# Patient Record
Sex: Female | Born: 1990 | Race: Black or African American | Hispanic: No | Marital: Single | Smoking: Current every day smoker
Health system: Southern US, Community
[De-identification: ages and names within clinical notes are randomized; demographics above are authoritative.]

## PROBLEM LIST (undated history)

## (undated) ENCOUNTER — Inpatient Hospital Stay (HOSPITAL_COMMUNITY): Payer: Self-pay

## (undated) DIAGNOSIS — Z8632 Personal history of gestational diabetes: Secondary | ICD-10-CM

## (undated) DIAGNOSIS — F32A Depression, unspecified: Secondary | ICD-10-CM

## (undated) DIAGNOSIS — Z98891 History of uterine scar from previous surgery: Secondary | ICD-10-CM

## (undated) DIAGNOSIS — Z90711 Acquired absence of uterus with remaining cervical stump: Secondary | ICD-10-CM

## (undated) DIAGNOSIS — F319 Bipolar disorder, unspecified: Secondary | ICD-10-CM

## (undated) DIAGNOSIS — F17200 Nicotine dependence, unspecified, uncomplicated: Secondary | ICD-10-CM

## (undated) DIAGNOSIS — F988 Other specified behavioral and emotional disorders with onset usually occurring in childhood and adolescence: Secondary | ICD-10-CM

## (undated) DIAGNOSIS — Z9089 Acquired absence of other organs: Secondary | ICD-10-CM

## (undated) DIAGNOSIS — Z8659 Personal history of other mental and behavioral disorders: Secondary | ICD-10-CM

## (undated) DIAGNOSIS — I10 Essential (primary) hypertension: Secondary | ICD-10-CM

## (undated) DIAGNOSIS — F1910 Other psychoactive substance abuse, uncomplicated: Secondary | ICD-10-CM

## (undated) DIAGNOSIS — F99 Mental disorder, not otherwise specified: Secondary | ICD-10-CM

## (undated) DIAGNOSIS — R569 Unspecified convulsions: Secondary | ICD-10-CM

## (undated) DIAGNOSIS — Z9889 Other specified postprocedural states: Secondary | ICD-10-CM

## (undated) DIAGNOSIS — F1291 Cannabis use, unspecified, in remission: Secondary | ICD-10-CM

## (undated) DIAGNOSIS — Z87898 Personal history of other specified conditions: Secondary | ICD-10-CM

## (undated) DIAGNOSIS — F329 Major depressive disorder, single episode, unspecified: Secondary | ICD-10-CM

## (undated) DIAGNOSIS — F419 Anxiety disorder, unspecified: Secondary | ICD-10-CM

## (undated) HISTORY — PX: TONSILLECTOMY: SUR1361

---
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
## 2012-02-05 ENCOUNTER — Encounter (HOSPITAL_COMMUNITY): Payer: Self-pay

## 2012-02-05 ENCOUNTER — Inpatient Hospital Stay (HOSPITAL_COMMUNITY)
Admission: AD | Admit: 2012-02-05 | Discharge: 2012-02-05 | Disposition: A | Discharge status: Home / Self Care | Payer: Medicaid Other | Source: Ambulatory Visit | Attending: Obstetrics and Gynecology | Admitting: Obstetrics and Gynecology

## 2012-02-05 DIAGNOSIS — K2990 Gastroduodenitis, unspecified, without bleeding: Secondary | ICD-10-CM | POA: Insufficient documentation

## 2012-02-05 DIAGNOSIS — O99891 Other specified diseases and conditions complicating pregnancy: Secondary | ICD-10-CM | POA: Insufficient documentation

## 2012-02-05 DIAGNOSIS — R109 Unspecified abdominal pain: Secondary | ICD-10-CM | POA: Insufficient documentation

## 2012-02-05 DIAGNOSIS — K219 Gastro-esophageal reflux disease without esophagitis: Secondary | ICD-10-CM | POA: Insufficient documentation

## 2012-02-05 DIAGNOSIS — K2970 Gastritis, unspecified, without bleeding: Secondary | ICD-10-CM | POA: Insufficient documentation

## 2012-02-05 DIAGNOSIS — N949 Unspecified condition associated with female genital organs and menstrual cycle: Secondary | ICD-10-CM

## 2012-02-05 LAB — COMPREHENSIVE METABOLIC PANEL
AST: 13 U/L (ref 0–37)
BUN: 10 mg/dL (ref 6–23)
CO2: 25 mEq/L (ref 19–32)
Calcium: 9.4 mg/dL (ref 8.4–10.5)
Chloride: 105 mEq/L (ref 96–112)
Creatinine, Ser: 0.48 mg/dL — ABNORMAL LOW (ref 0.50–1.10)
GFR calc Af Amer: >90 mL/min (ref >90)
GFR calc non Af Amer: >90 mL/min (ref >90)
Glucose, Bld: 63 mg/dL — ABNORMAL LOW (ref 70–99)
Total Bilirubin: 0.2 mg/dL — ABNORMAL LOW (ref 0.3–1.2)

## 2012-02-05 LAB — URINALYSIS, ROUTINE W REFLEX MICROSCOPIC
Glucose, UA: NEGATIVE mg/dL
Hgb urine dipstick: NEGATIVE
Ketones, ur: NEGATIVE mg/dL
Leukocytes, UA: NEGATIVE
pH: 6.5 (ref 5.0–8.0)

## 2012-02-05 LAB — DIFFERENTIAL
Eosinophils Relative: 1 % (ref 0–5)
Lymphocytes Relative: 19 % (ref 12–46)
Monocytes Absolute: 1 10*3/uL (ref 0.1–1.0)
Monocytes Relative: 8 % (ref 3–12)
Neutro Abs: 8.9 10*3/uL — ABNORMAL HIGH (ref 1.7–7.7)

## 2012-02-05 LAB — CBC
HCT: 30.5 % — ABNORMAL LOW (ref 36.0–46.0)
Hemoglobin: 10.1 g/dL — ABNORMAL LOW (ref 12.0–15.0)
MCV: 90.8 fL (ref 78.0–100.0)
WBC: 12.4 10*3/uL — ABNORMAL HIGH (ref 4.0–10.5)

## 2012-02-05 LAB — LIPASE, BLOOD: Lipase: 23 U/L (ref 11–59)

## 2012-02-05 LAB — AMYLASE: Amylase: 82 U/L (ref 0–105)

## 2012-02-05 NOTE — MAU Note (Signed)
Pt in c/o sharp lower abdominal pains that occasionally radiate up abdomen and shoot into back.  Pains started around 0700.  Worse with movement.  Denies any bleeding or discharge.  +FM.

## 2012-02-05 NOTE — MAU Provider Note (Signed)
History     CSN: [id number]  Arrival date & time 02/05/12  1328   None     Chief Complaint  Patient presents with  . Abdominal Pain    HPI Stacy Moss is a 21 y.o. female @ [redacted]w[redacted]d gestation who presents to MAU for abdominal pain. The pain started approximately 7 am and has gotten worse. She describes the pain as cramping in the lower abdomen that is constant and then stabing pain at the upper abdomen that comes and goes. The pain increases with walking and movement and is better with rest. She has had approximately 20 ounces of water to drink since 7 am and has eaten a hot pocket.  She rates the pain as 8/10. She has had nausea but no vomiting. The history was provided by the patient. Prenatal care with Dr. Ellyn Hack.  Past Medical History  Diagnosis Date  . No pertinent past medical history     Past Surgical History  Procedure Date  . Tonsillectomy     Family History  Problem Relation Age of Onset  . Anesthesia problems Neg Hx     History  Substance Use Topics  . Smoking status: Current Some Day Smoker    Types: Cigarettes  . Smokeless tobacco: Not on file  . Alcohol Use: No    OB History    Grav Para Term Preterm Abortions TAB SAB Ect Mult Living   1               Review of Systems  Constitutional: Positive for appetite change. Negative for fever, chills, diaphoresis and fatigue.  HENT: Negative for ear pain, congestion, sore throat, facial swelling, neck pain, neck stiffness, dental problem and sinus pressure.   Eyes: Negative for photophobia, pain and discharge.  Respiratory: Negative for cough, chest tightness and wheezing.   Cardiovascular: Positive for leg swelling.  Gastrointestinal: Positive for nausea and abdominal pain. Negative for vomiting, diarrhea, constipation and abdominal distention.  Genitourinary: Positive for pelvic pain. Negative for dysuria, urgency, frequency, flank pain, vaginal bleeding, vaginal discharge and difficulty urinating.    Musculoskeletal: Positive for back pain. Negative for myalgias and gait problem.  Skin: Negative for color change and rash.  Neurological: Positive for headaches. Negative for dizziness, speech difficulty, weakness, light-headedness and numbness.  Psychiatric/Behavioral: Negative for confusion and agitation.       Dx with anxiety, depression and bipolar, but controlled with medication.    Allergies  Review of patient's allergies indicates no known allergies.  Home Medications  No current outpatient prescriptions on file.  BP 132/74  Pulse 94  Temp(Src) 97.7 F (36.5 C) (Oral)  Resp 20  Ht 5\' 7"  (1.702 m)  Wt 215 lb 9.6 oz (97.796 kg)  BMI 33.77 kg/m2  SpO2 100%  Physical Exam  Nursing note and vitals reviewed. Constitutional: She is oriented to person, place, and time. She appears well-developed and well-nourished. No distress.  HENT:  Head: Normocephalic.  Eyes: EOM are normal.  Neck: Neck supple.  Cardiovascular: Normal rate.   Pulmonary/Chest: Effort normal.  Abdominal: Soft. There is no tenderness.  Musculoskeletal: Normal range of motion.  Neurological: She is alert and oriented to person, place, and time. No cranial nerve deficit.  Skin: Skin is warm and dry.  Psychiatric: She has a normal mood and affect. Her behavior is normal. Judgment and thought content normal.   Results for orders placed during the hospital encounter of 02/05/12 (from the past 24 hour(s))  URINALYSIS, ROUTINE W REFLEX  MICROSCOPIC     Status: Abnormal   Collection Time   02/05/12  1:55 PM      Component Value Range   Color, Urine YELLOW  YELLOW    APPearance CLOUDY (*) CLEAR    Specific Gravity, Urine 1.020  1.005 - 1.030    pH 6.5  5.0 - 8.0    Glucose, UA NEGATIVE  NEGATIVE (mg/dL)   Hgb urine dipstick NEGATIVE  NEGATIVE    Bilirubin Urine NEGATIVE  NEGATIVE    Ketones, ur NEGATIVE  NEGATIVE (mg/dL)   Protein, ur NEGATIVE  NEGATIVE (mg/dL)   Urobilinogen, UA 0.2  0.0 - 1.0 (mg/dL)    Nitrite NEGATIVE  NEGATIVE    Leukocytes, UA NEGATIVE  NEGATIVE   CBC     Status: Abnormal   Collection Time   02/05/12  3:03 PM      Component Value Range   WBC 12.4 (*) 4.0 - 10.5 (K/uL)   RBC 3.36 (*) 3.87 - 5.11 (MIL/uL)   Hemoglobin 10.1 (*) 12.0 - 15.0 (g/dL)   HCT 16.1 (*) 09.6 - 46.0 (%)   MCV 90.8  78.0 - 100.0 (fL)   MCH 30.1  26.0 - 34.0 (pg)   MCHC 33.1  30.0 - 36.0 (g/dL)   RDW 04.5  40.9 - 81.1 (%)   Platelets 210  150 - 400 (K/uL)  DIFFERENTIAL     Status: Abnormal   Collection Time   02/05/12  3:03 PM      Component Value Range   Neutrophils Relative 72  43 - 77 (%)   Neutro Abs 8.9 (*) 1.7 - 7.7 (K/uL)   Lymphocytes Relative 19  12 - 46 (%)   Lymphs Abs 2.4  0.7 - 4.0 (K/uL)   Monocytes Relative 8  3 - 12 (%)   Monocytes Absolute 1.0  0.1 - 1.0 (K/uL)   Eosinophils Relative 1  0 - 5 (%)   Eosinophils Absolute 0.1  0.0 - 0.7 (K/uL)   Basophils Relative 0  0 - 1 (%)   Basophils Absolute 0.0  0.0 - 0.1 (K/uL)  COMPREHENSIVE METABOLIC PANEL     Status: Abnormal   Collection Time   02/05/12  3:03 PM      Component Value Range   Sodium 136  135 - 145 (mEq/L)   Potassium 3.9  3.5 - 5.1 (mEq/L)   Chloride 105  96 - 112 (mEq/L)   CO2 25  19 - 32 (mEq/L)   Glucose, Bld 63 (*) 70 - 99 (mg/dL)   BUN 10  6 - 23 (mg/dL)   Creatinine, Ser 9.14 (*) 0.50 - 1.10 (mg/dL)   Calcium 9.4  8.4 - 78.2 (mg/dL)   Total Protein 6.2  6.0 - 8.3 (g/dL)   Albumin 2.9 (*) 3.5 - 5.2 (g/dL)   AST 13  0 - 37 (U/L)   ALT 12  0 - 35 (U/L)   Alkaline Phosphatase 46  39 - 117 (U/L)   Total Bilirubin 0.2 (*) 0.3 - 1.2 (mg/dL)   GFR calc non Af Amer >90  >90 (mL/min)   GFR calc Af Amer >90  >90 (mL/min)  LIPASE, BLOOD     Status: Normal   Collection Time   02/05/12  3:03 PM      Component Value Range   Lipase 23  11 - 59 (U/L)  AMYLASE     Status: Normal   Collection Time   02/05/12  3:03 PM      Component  Value Range   Amylase 82  0 - 105 (U/L)   ED Course: consult with Dr. Ambrose Mantle @  14:52, will order labs, TOCO to monitor for contractions and po hydration  Procedures  Patient feeling much better after resting and po hydration  No contractions noted on monitor MDM: Dr. Ambrose Mantle notified of lab results and will d/c patient home to follow up in the office.   Assessment: Abdominal pain in pregnancy  Differential UJ:WJXBJ ligament pain   Gastritis/GERD    Plan:  Discussed with patient in detail need for po hydration and eating small meals more frequently   Pepcid OTC prn   Tylenol prn   Follow up in the office

## 2012-02-05 NOTE — MAU Note (Signed)
Patient states she started having lower abdominal pain last night that is worse with standing up. Denies any bleeding, leaking or unusual discharge. No vomiting. Reports feeling movement.

## 2012-02-05 NOTE — Discharge Instructions (Signed)
Be sure you are drinking plenty of fluids at least 8 glasses of water a day. Eat small meals more frequently. Take tylenol as needed for your discomfort and take Pepcid for your upper abdominal pain and burning. Follow up in the office and return here as needed.   Diet for Gastroesophageal Reflux Disease, Child Some children have small, brief episodes of reflux. Reflux (acid reflux) is when acid from your stomach flows up into the esophagus. When acid comes in contact with the esophagus, the acid causes irritation and soreness (inflammation) in the esophagus. The reflux may be so small that a child may not notice it. When reflux happens often or so severely that it causes damage to the esophagus, it is called gastroesophageal reflux disease (GERD). Nutrition therapy can help ease the discomfort of GERD.  FOODS TO AVOID   Caffeinated and decaffeinated coffee and black tea.   Regular or low-calorie carbonated beverages or energy drinks (caffeine-free carbonated beverages are allowed).   Strong spices, such as black pepper, white pepper, red pepper, cayenne, curry powder, and chili powder.   Peppermint or spearmint.   Chocolate.   High-fat foods, including meats and fried foods. Extra added fats including oils, butter, salad dressings, and nuts. Low-fat foods may not be recommended for children less than 10 years of age. Discuss this with your doctor or dietitian.   Fruits and vegetables that are not tolerated, such as citrus fruitsand tomatoes.   Any food that seems to aggravate the child's condition.  If you have questions regarding your child's diet, call your caregiver or a registered dietician. OTHER THINGS THAT MAY HELP GERD INCLUDE:  Having the child eat his or her meals slowly, in a relaxed setting.   Serving several small meals throughout the day instead of 3 large meals.   Eliminating food for a period of time if it causes distress.   Not letting the child lie down immediately  after eating a meal.   Keeping the head of the child's bed raised 6 to 9 inches (15 to 23 cm) by using a foam wedge or blocks under the legs of the bed.   Encouraging the child to be physically active. Weight loss may be helpful in reducing reflux in overweight or obese children.   Having the child wear loose-fitting clothing.   Avoiding the use of tobacco in parents and caregivers. Secondhand smoke may aggravate symptoms in children with reflux.  SAMPLE MEAL PLAN This is a sample meal plan for a 19 to 19 year old child and is approximately 1200 calories based on https://www.bernard.org/ meal planning guidelines.  Breakfast   cup cooked oatmeal.    cup strawberries.    cup low-fat milk.  Snack   cup cucumber slices.   4 oz yogurt (made from low-fat milk).  Lunch  1 slice whole-wheat bread.   1 oz chicken.    cup blueberries.    cup snap peas.  Snack  3 whole-wheat crackers.   1 oz string cheese.  Dinner   cup brown rice.    cup mixed veggies.   1 cup low-fat milk.   2 oz grilled fish.  Document Released: 02/24/2007 Document Revised: 09/27/2011 Document Reviewed: 08/30/2011 [facility] Patient Information 2012 [place], [place].Heartburn During Pregnancy  Heartburn happens when stomach acid goes up into the esophagus. The esophagus is the tube between the mouth and the stomach. This acid causes a burning pain in the chest or throat. This happens more often in the later part of  pregnancy because the womb (uterus) gets larger. It may also happen because of hormone changes. Heartburn problems often go away after giving birth. HOME CARE  Take all medicine as told by your doctor.   Raise the head of your bed with blocks only as told by your doctor.   Do not exercise right after eating.   Avoid eating 2 or 3 hours before bed. Do not lie down right after eating.   Eat small meals throughout the day instead of 3 large meals.   Avoid foods that give you heartburn. Foods  you may want to avoid include:   Peppers.   Chocolate.   High-fat foods, including fried foods.   Spicy foods.   Garlic and onions.   Citrus fruits, including oranges, grapefruit, lemons, and limes.   Food containing tomatoes or tomato products.   Mint.   Bubbly (carbonated) drinks and drinks with caffeine.   Vinegar.  GET HELP RIGHT AWAY IF:  You have bad chest pain that goes down your arm or into your jaw or neck.   You feel sweaty, dizzy, or lightheaded.   You have trouble breathing.   You throw up (vomit) blood.   You have trouble or pain when swallowing.   You have bloody or black poop (stool).   You have heartburn more than 3 times a week, for more than 2 weeks.  MAKE SURE YOU:  Understand these instructions.   Will watch your condition.   Will get help right away if you are not doing well or get worse.  Document Released: 11/10/2010 Document Revised: 09/27/2011 Document Reviewed: 02/25/2011 [facility] Patient Information 2012 [place], [place].Heartburn During Pregnancy  Heartburn happens when stomach acid goes up into the esophagus. The esophagus is the tube between the mouth and the stomach. This acid causes a burning pain in the chest or throat. This happens more often in the later part of pregnancy because the womb (uterus) gets larger. It may also happen because of hormone changes. Heartburn problems often go away after giving birth. HOME CARE  Take all medicine as told by your doctor.   Raise the head of your bed with blocks only as told by your doctor.   Do not exercise right after eating.   Avoid eating 2 or 3 hours before bed. Do not lie down right after eating.   Eat small meals throughout the day instead of 3 large meals.   Avoid foods that give you heartburn. Foods you may want to avoid include:   Peppers.   Chocolate.   High-fat foods, including fried foods.   Spicy foods.   Garlic and onions.   Citrus fruits, including oranges,  grapefruit, lemons, and limes.   Food containing tomatoes or tomato products.   Mint.   Bubbly (carbonated) drinks and drinks with caffeine.   Vinegar.  GET HELP RIGHT AWAY IF:  You have bad chest pain that goes down your arm or into your jaw or neck.   You feel sweaty, dizzy, or lightheaded.   You have trouble breathing.   You throw up (vomit) blood.   You have trouble or pain when swallowing.   You have bloody or black poop (stool).   You have heartburn more than 3 times a week, for more than 2 weeks.  MAKE SURE YOU:  Understand these instructions.   Will watch your condition.   Will get help right away if you are not doing well or get worse.  Document Released: 11/10/2010 Document Revised:  09/27/2011 Document Reviewed: 02/25/2011 [facility] Patient Information 2012 [place], [place].

## 2012-02-05 NOTE — MAU Note (Signed)
Last intercourse last night. Was painful.

## 2012-03-24 ENCOUNTER — Other Ambulatory Visit (HOSPITAL_COMMUNITY): Payer: Self-pay | Admitting: Psychiatry

## 2012-03-24 NOTE — Telephone Encounter (Signed)
Patient has never been seen in this practice or by this provider

## 2012-05-06 ENCOUNTER — Encounter (HOSPITAL_COMMUNITY): Payer: Self-pay | Admitting: *Deleted

## 2012-05-06 ENCOUNTER — Inpatient Hospital Stay (HOSPITAL_COMMUNITY)
Admission: AD | Admit: 2012-05-06 | Discharge: 2012-05-06 | Disposition: A | Discharge status: Home / Self Care | Payer: Medicaid Other | Source: Ambulatory Visit | Attending: Obstetrics and Gynecology | Admitting: Obstetrics and Gynecology

## 2012-05-06 DIAGNOSIS — O9989 Other specified diseases and conditions complicating pregnancy, childbirth and the puerperium: Secondary | ICD-10-CM

## 2012-05-06 DIAGNOSIS — R51 Headache: Secondary | ICD-10-CM

## 2012-05-06 DIAGNOSIS — R109 Unspecified abdominal pain: Secondary | ICD-10-CM | POA: Insufficient documentation

## 2012-05-06 DIAGNOSIS — O26899 Other specified pregnancy related conditions, unspecified trimester: Secondary | ICD-10-CM

## 2012-05-06 DIAGNOSIS — R220 Localized swelling, mass and lump, head: Secondary | ICD-10-CM

## 2012-05-06 DIAGNOSIS — O99891 Other specified diseases and conditions complicating pregnancy: Secondary | ICD-10-CM | POA: Insufficient documentation

## 2012-05-06 HISTORY — DX: Acquired absence of other organs: Z90.89

## 2012-05-06 LAB — URINALYSIS, ROUTINE W REFLEX MICROSCOPIC
Bilirubin Urine: NEGATIVE
Ketones, ur: NEGATIVE mg/dL
Nitrite: NEGATIVE
Protein, ur: NEGATIVE mg/dL
pH: 6 (ref 5.0–8.0)

## 2012-05-06 MED ORDER — ACETAMINOPHEN 325 MG PO TABS
650.0000 mg | ORAL_TABLET | Freq: Once | ORAL | Status: AC
  Administered 2012-05-06: 650 mg via ORAL
  Filled 2012-05-06: qty 2

## 2012-05-06 NOTE — MAU Note (Signed)
Headache x1 week. Abdominal pain that is sharp & intermittent x1 week. Denies LOF, vaginal bleeding. Positive fetal movement. Was treated for hypertension when she was 18. States BP was high in the office.

## 2012-05-07 NOTE — MAU Provider Note (Signed)
Chief Complaint:  Headache and Abdominal Pain  First Provider Initiated Contact with Patient 05/06/12 2304    HPI  Stacy Moss is a 21 y.o. G1P0 at [redacted]w[redacted]d presenting with HA, wanting BP check, pain in scalp mass and mild, sharp, intermittent low abd pain w/ mvmt. Has Hx of HA's, worse throughout pregnancy, associated w/ scotoma. Describes as throbbing, bilat frontal. Denies sinus congestion, fever, chills, nasal allergies, epigastric pain, neck pain, contractions, leakage of fluid or vaginal bleeding. Good fetal movement.   States she has a mass on the right back temporal area of scalp at the site of an old injury. Thinks it has enlarged recently. Throbs w/ HA, otherwise no pain. No Hx Keloids.   Past Medical History: Past Medical History  Diagnosis Date  . No pertinent past medical history   . Hx of tonsillectomy     Past Surgical History: Past Surgical History  Procedure Date  . Tonsillectomy     Family History: Family History  Problem Relation Age of Onset  . Anesthesia problems Neg Hx     Social History: History  Substance Use Topics  . Smoking status: Current Some Day Smoker    Types: Cigarettes  . Smokeless tobacco: Not on file  . Alcohol Use: No    Allergies: No Known Allergies  Meds:  No prescriptions prior to admission      Physical Exam  Blood pressure 127/53, pulse 90, temperature 98.8 F (37.1 C), temperature source Oral, resp. rate 18, height 5\' 9"  (1.753 m), weight 107.321 kg (236 lb 9.6 oz), SpO2 100.00%. Patient Vitals for the past 24 hrs:  BP Temp Temp src Pulse Resp SpO2 Height Weight  05/06/12 2322 127/53 mmHg - - 90  - - - -  05/06/12 2303 119/61 mmHg - - 91  - - - -  05/06/12 2248 127/57 mmHg - - 93  18  - - -  05/06/12 2227 127/62 mmHg 98.8 F (37.1 C) Oral 79  18  100 % 5\' 9"  (1.753 m) 107.321 kg (236 lb 9.6 oz)    GENERAL: Well-developed, well-nourished female in no acute distress.  HEENT: normocephalic, good dentition. 3-4 cm  brown, soft, raised, NT mass on scalp. Non-fluctuant. No pulsations. No erythema, warmth or drainage. Rugated texture. No sinus tenderness or rhinorrhea. HEART: normal rate RESP: normal effort ABDOMEN: Soft, nontender, gravid appropriate for gestational age EXTREMITIES: Nontender, no edema, DTRs 2+, no clonus NEURO: alert and oriented  SPECULUM EXAM: Deferred  FHT:  Baseline 140 , moderate variability, accelerations present, no decelerations Contractions: q 5 mins, painless. Pt unaware   Labs: Results for orders placed during the hospital encounter of 05/06/12 (from the past 24 hour(s))  URINALYSIS, ROUTINE W REFLEX MICROSCOPIC     Status: Abnormal   Collection Time   05/06/12 10:25 PM      Component Value Range   Color, Urine YELLOW  YELLOW   APPearance CLEAR  CLEAR   Specific Gravity, Urine >1.030 (*) 1.005 - 1.030   pH 6.0  5.0 - 8.0   Glucose, UA NEGATIVE  NEGATIVE mg/dL   Hgb urine dipstick NEGATIVE  NEGATIVE   Bilirubin Urine NEGATIVE  NEGATIVE   Ketones, ur NEGATIVE  NEGATIVE mg/dL   Protein, ur NEGATIVE  NEGATIVE mg/dL   Urobilinogen, UA 0.2  0.0 - 1.0 mg/dL   Nitrite NEGATIVE  NEGATIVE   Leukocytes, UA NEGATIVE  NEGATIVE   Imaging:  NA  ED Course: Pain improved slightly w/ Tylenol. No further testing  needed in MAU per Dr Jackelyn Knife.  Assessment: 1. Pregnancy headache, antepartum   2. Scalp mass    Plan: D/C home per Dr. Jackelyn Knife. May take Tylenol PRN for HA. Notify provider if no relief. Make appointment w/ PCP ASAP to evaluate scalp mass.  PIH and PTL precautions, FKCs Follow-up Information    Schedule an appointment as soon as possible for a visit with Zenaida Niece, MD.   Contact information:   [address] [place], [address] [place] [address] [phone]       Follow up with [facility]. (As needed if symptoms worsen)    Contact information:   [address] [place] [address] [phone]         Medication List  As of 05/07/2012  4:35 AM   CONTINUE taking these medications         FLUoxetine 20 MG capsule   Commonly known as: PROZAC      haloperidol 0.5 MG tablet   Commonly known as: HALDOL      hydrOXYzine 25 MG capsule   Commonly known as: VISTARIL      prenatal multivitamin Tabs           Mellonie Guess 7/17/20131:28 AM

## 2012-05-07 NOTE — Progress Notes (Signed)
FHT from last pm reviewed.  Reactive NST, occ ctx, no decels.

## 2012-05-24 ENCOUNTER — Inpatient Hospital Stay (HOSPITAL_COMMUNITY)
Admission: AD | Admit: 2012-05-24 | Discharge: 2012-05-28 | DRG: 766 | Disposition: A | Discharge status: Home / Self Care | Payer: Medicaid Other | Source: Ambulatory Visit | Attending: Obstetrics and Gynecology | Admitting: Obstetrics and Gynecology

## 2012-05-24 DIAGNOSIS — Z98891 History of uterine scar from previous surgery: Secondary | ICD-10-CM

## 2012-05-24 HISTORY — DX: History of uterine scar from previous surgery: Z98.891

## 2012-05-24 LAB — CBC
MCV: 88.8 fL (ref 78.0–100.0)
Platelets: 198 10*3/uL (ref 150–400)
RDW: 12.5 % (ref 11.5–15.5)
WBC: 12.9 10*3/uL — ABNORMAL HIGH (ref 4.0–10.5)

## 2012-05-24 MED ORDER — FENTANYL 2.5 MCG/ML BUPIVACAINE 1/10 % EPIDURAL INFUSION (WH - ANES)
14.0000 mL/h | INTRAMUSCULAR | Status: DC
  Administered 2012-05-24 – 2012-05-25 (×3): 14 mL/h via EPIDURAL
  Filled 2012-05-24 (×3): qty 60

## 2012-05-24 MED ORDER — BUTORPHANOL TARTRATE 1 MG/ML IJ SOLN: 1.0000 mg | INTRAMUSCULAR | Status: DC | PRN

## 2012-05-24 MED ORDER — EPHEDRINE 5 MG/ML INJ: 10.0000 mg | INTRAVENOUS | Status: DC | PRN

## 2012-05-24 MED ORDER — CITRIC ACID-SODIUM CITRATE 334-500 MG/5ML PO SOLN
30.0000 mL | ORAL | Status: DC | PRN
  Administered 2012-05-25: 30 mL via ORAL
  Filled 2012-05-24: qty 15

## 2012-05-24 MED ORDER — IBUPROFEN 600 MG PO TABS: 600.0000 mg | ORAL_TABLET | Freq: Four times a day (QID) | ORAL | Status: DC | PRN

## 2012-05-24 MED ORDER — ONDANSETRON HCL 4 MG/2ML IJ SOLN
4.0000 mg | Freq: Four times a day (QID) | INTRAMUSCULAR | Status: DC | PRN
  Administered 2012-05-25: 4 mg via INTRAVENOUS
  Filled 2012-05-24: qty 2

## 2012-05-24 MED ORDER — OXYTOCIN 40 UNITS IN LACTATED RINGERS INFUSION - SIMPLE MED
1.0000 m[IU]/min | INTRAVENOUS | Status: DC
  Administered 2012-05-24: 2 m[IU]/min via INTRAVENOUS
  Filled 2012-05-24: qty 1000

## 2012-05-24 MED ORDER — PENICILLIN G POTASSIUM 5000000 UNITS IJ SOLR
2.5000 10*6.[IU] | INTRAVENOUS | Status: DC
  Administered 2012-05-24 – 2012-05-25 (×3): 2.5 10*6.[IU] via INTRAVENOUS
  Filled 2012-05-24 (×6): qty 2.5

## 2012-05-24 MED ORDER — ZOLPIDEM TARTRATE 5 MG PO TABS
5.0000 mg | ORAL_TABLET | Freq: Every evening | ORAL | Status: DC | PRN
Start: 2012-05-24 — End: 2012-05-25
  Administered 2012-05-24: 5 mg via ORAL
  Filled 2012-05-24: qty 1

## 2012-05-24 MED ORDER — EPHEDRINE 5 MG/ML INJ
10.0000 mg | INTRAVENOUS | Status: DC | PRN
  Filled 2012-05-24: qty 4

## 2012-05-24 MED ORDER — LIDOCAINE HCL (PF) 1 % IJ SOLN
INTRAMUSCULAR | Status: DC | PRN
  Administered 2012-05-24 (×3): 4 mL

## 2012-05-24 MED ORDER — LACTATED RINGERS IV SOLN
500.0000 mL | INTRAVENOUS | Status: DC | PRN
  Administered 2012-05-25: 500 mL via INTRAVENOUS
  Administered 2012-05-25: 300 mL via INTRAVENOUS

## 2012-05-24 MED ORDER — OXYTOCIN 40 UNITS IN LACTATED RINGERS INFUSION - SIMPLE MED: 62.5000 mL/h | Freq: Once | INTRAVENOUS | Status: DC

## 2012-05-24 MED ORDER — TERBUTALINE SULFATE 1 MG/ML IJ SOLN: 0.2500 mg | Freq: Once | INTRAMUSCULAR | Status: AC | PRN

## 2012-05-24 MED ORDER — PHENYLEPHRINE 40 MCG/ML (10ML) SYRINGE FOR IV PUSH (FOR BLOOD PRESSURE SUPPORT)
80.0000 ug | PREFILLED_SYRINGE | INTRAVENOUS | Status: DC | PRN
  Filled 2012-05-24: qty 5

## 2012-05-24 MED ORDER — PHENYLEPHRINE 40 MCG/ML (10ML) SYRINGE FOR IV PUSH (FOR BLOOD PRESSURE SUPPORT): 80.0000 ug | PREFILLED_SYRINGE | INTRAVENOUS | Status: DC | PRN

## 2012-05-24 MED ORDER — FLEET ENEMA 7-19 GM/118ML RE ENEM: 1.0000 | ENEMA | RECTAL | Status: DC | PRN

## 2012-05-24 MED ORDER — PENICILLIN G POTASSIUM 5000000 UNITS IJ SOLR
5.0000 10*6.[IU] | Freq: Once | INTRAVENOUS | Status: AC
  Administered 2012-05-24: 5 10*6.[IU] via INTRAVENOUS
  Filled 2012-05-24: qty 5

## 2012-05-24 MED ORDER — ACETAMINOPHEN 325 MG PO TABS: 650.0000 mg | ORAL_TABLET | ORAL | Status: DC | PRN

## 2012-05-24 MED ORDER — LACTATED RINGERS IV SOLN
500.0000 mL | Freq: Once | INTRAVENOUS | Status: AC
  Administered 2012-05-24: 500 mL via INTRAVENOUS

## 2012-05-24 MED ORDER — DIPHENHYDRAMINE HCL 50 MG/ML IJ SOLN: 12.5000 mg | INTRAMUSCULAR | Status: DC | PRN

## 2012-05-24 MED ORDER — LACTATED RINGERS IV SOLN
INTRAVENOUS | Status: DC
  Administered 2012-05-24 (×2): via INTRAVENOUS
  Administered 2012-05-25: 125 mL/h via INTRAVENOUS

## 2012-05-24 MED ORDER — OXYCODONE-ACETAMINOPHEN 5-325 MG PO TABS: 1.0000 | ORAL_TABLET | ORAL | Status: DC | PRN

## 2012-05-24 MED ORDER — LIDOCAINE HCL (PF) 1 % IJ SOLN: 30.0000 mL | INTRAMUSCULAR | Status: DC | PRN

## 2012-05-24 MED ORDER — OXYTOCIN BOLUS FROM INFUSION
250.0000 mL | Freq: Once | INTRAVENOUS | Status: DC
  Filled 2012-05-24: qty 500

## 2012-05-24 NOTE — Anesthesia Procedure Notes (Signed)
Epidural Patient location during procedure: OB Start time: 05/24/2012 8:26 PM Reason for block: procedure for pain  Staffing Performed by: anesthesiologist   Preanesthetic Checklist Completed: patient identified, site marked, surgical consent, pre-op evaluation, timeout performed, IV checked, risks and benefits discussed and monitors and equipment checked  Epidural Patient position: sitting Prep: site prepped and draped and DuraPrep Patient monitoring: continuous pulse ox and blood pressure Approach: midline Injection technique: LOR air  Needle:  Needle type: Tuohy  Needle gauge: 17 G Needle length: 9 cm Catheter type: closed end flexible Catheter size: 19 Gauge Catheter at skin depth: 14 cm Test dose: negative  Assessment Events: blood not aspirated, injection not painful, no injection resistance, negative IV test and no paresthesia  Additional Notes Discussed risk of headache, infection, bleeding, nerve injury and failed or incomplete block.  Patient voices understanding and wishes to proceed.

## 2012-05-24 NOTE — Progress Notes (Signed)
CSW spoke with MD and is aware of pt's mental health hx.  Will consult with pt after delivery on mother-baby unit.

## 2012-05-24 NOTE — Progress Notes (Signed)
Patient ID: Stacy Moss, female   DOB: 09/29/1991, 21 y.o.   MRN: [id number]  Pt with SROM, some delay with transferring to L&D secondary to being busy.    AFVSS FHTs - 130's, category I toco - irregular  SVE 1.2cm  Continue current mgmt PCN Pitocin to augment

## 2012-05-24 NOTE — Progress Notes (Signed)
Patient ID: Stacy Moss, female   DOB: 1991/10/18, 21 y.o.   MRN: [id number] Comfortable with epidural AFVSS gen NAD FHTs 125 category 1 toco irr,  IUPC placed w/o diff/complication SVE 3.4/80/0  D/w pt latent phase labor, ambien for sleep Expect SVD

## 2012-05-24 NOTE — Anesthesia Preprocedure Evaluation (Signed)
Anesthesia Evaluation  Patient identified by MRN, date of birth, ID band Patient awake    Reviewed: Allergy & Precautions, H&P , NPO status , Patient's Chart, lab work & pertinent test results, reviewed documented beta blocker date and time   History of Anesthesia Complications Negative for: history of anesthetic complications  Airway Mallampati: I TM Distance: >3 FB Neck ROM: full    Dental  (+) Teeth Intact   Pulmonary Current Smoker,  breath sounds clear to auscultation        Cardiovascular negative cardio ROS  Rhythm:regular Rate:Normal     Neuro/Psych PSYCHIATRIC DISORDERS (anxiety, depression, bipolar) negative neurological ROS     GI/Hepatic negative GI ROS, Neg liver ROS,   Endo/Other  negative endocrine ROS  Renal/GU negative Renal ROS     Musculoskeletal   Abdominal   Peds  Hematology  (+) Blood dyscrasia (hgb 9.7), anemia ,   Anesthesia Other Findings   Reproductive/Obstetrics (+) Pregnancy                           Anesthesia Physical Anesthesia Plan  ASA: II  Anesthesia Plan: Epidural   Post-op Pain Management:    Induction:   Airway Management Planned:   Additional Equipment:   Intra-op Plan:   Post-operative Plan:   Informed Consent: I have reviewed the patients History and Physical, chart, labs and discussed the procedure including the risks, benefits and alternatives for the proposed anesthesia with the patient or authorized representative who has indicated his/her understanding and acceptance.     Plan Discussed with:   Anesthesia Plan Comments:         Anesthesia Quick Evaluation

## 2012-05-24 NOTE — MAU Note (Signed)
Patient brought directly to room 6 in MAU from lobby c/o rupture membranes G1 [redacted]w[redacted]d

## 2012-05-24 NOTE — H&P (Signed)
Stacy Moss is a 21 y.o. female G2P0010 at 38+ with ROM.  +FM, no LOF, no VB, occ ctx; uncomplicated PNC - Pt with ADD, Bipolar, PTSD, Anger issues.   Maternal Medical History:  Reason for admission: Reason for admission: rupture of membranes.  Contractions: Frequency: irregular.    Fetal activity: Perceived fetal activity is normal.      OB History    Grav Para Term Preterm Abortions TAB SAB Ect Mult Living   1             G1 SAB, G2 present; no pap, no STDs Past Medical History  Diagnosis Date  . No pertinent past medical history   . Hx of tonsillectomy   Bipolar, Depression, ADD, PTSD, anger issues, bipolar  Past Surgical History  Procedure Date  . Tonsillectomy    Family History: family history is negative for Anesthesia problems.drug addiction, alcoholism, arthritis, bipolar, breast CA, DM, HTN, psych issues, MR, Muscle D/O, MI, Throat CA Social History:  reports that she has been smoking Cigarettes.  She does not have any smokeless tobacco history on file. She reports that she does not drink alcohol or use illicit drugs. MJ use Single Meds PNV All NKDA   Prenatal Transfer Tool  Maternal Diabetes: No Genetic Screening: Normal Maternal Ultrasounds/Referrals: Normal Fetal Ultrasounds or other Referrals:  None Maternal Substance Abuse:  Yes:  Type: Smoker, Marijuana Significant Maternal Medications:  None Significant Maternal Lab Results:  Lab values include: Group B Strep positive Other Comments:  ADD, Bipolar, anger issues, PTSD,Depression  Review of Systems  Constitutional: Negative.   HENT: Negative.   Eyes: Negative.   Respiratory: Negative.   Cardiovascular: Negative.   Gastrointestinal: Negative.   Genitourinary: Negative.   Musculoskeletal: Negative.   Skin: Negative.   Neurological: Negative.   Psychiatric/Behavioral: Negative.       Height 5\' 9"  (1.753 m), weight 106.777 kg (235 lb 6.4 oz). Maternal Exam:  Abdomen: Fundal height is  appropriate for gestation.   Estimated fetal weight is 7#.   Fetal presentation: vertex     Physical Exam  Constitutional: She is oriented to person, place, and time. She appears well-developed and well-nourished.  HENT:  Head: Normocephalic and atraumatic.  Eyes: Conjunctivae are normal. Pupils are equal, round, and reactive to light.  Neck: Normal range of motion. Neck supple.  Cardiovascular: Normal rate and regular rhythm.   Respiratory: Effort normal and breath sounds normal. No respiratory distress.  GI: Soft. Bowel sounds are normal. There is no tenderness.  Musculoskeletal: Normal range of motion.  Neurological: She is alert and oriented to person, place, and time.  Skin: Skin is warm and dry.  Psychiatric: She has a normal mood and affect. Her behavior is normal.    Prenatal labs: ABO, Rh:  AB+ Antibody:  neg Rubella:  immune RPR:   NR HBsAg:   neg HIV:   neg GBS:   positive Hgb 10.2/ Plt 284K/ Hgb electro WNL/ GC neg/ Chl neg/ First Tri and AFP WNL/ CF neg/ glucola 68  Tdao 03/07/12  [place] cwd, EDC 8/16l limited heart and face, post plac, female F/u completes anat  Assessment/Plan: 20yo G2P0010 at 38+ with SROM for IOL/Augmentation PCN for gbbs prophylaxis Epidural for comfort Pitocin to augment Expect SVD   BOVARD,Dyna Figuereo 05/24/2012, 10:21 AM

## 2012-05-25 ENCOUNTER — Inpatient Hospital Stay (HOSPITAL_COMMUNITY): Payer: Medicaid Other | Admitting: Anesthesiology

## 2012-05-25 ENCOUNTER — Encounter (HOSPITAL_COMMUNITY): Payer: Self-pay | Admitting: Anesthesiology

## 2012-05-25 ENCOUNTER — Encounter (HOSPITAL_COMMUNITY)
Admission: AD | Disposition: A | Discharge status: Home / Self Care | Payer: Self-pay | Source: Ambulatory Visit | Attending: Obstetrics and Gynecology

## 2012-05-25 ENCOUNTER — Encounter (HOSPITAL_COMMUNITY): Payer: Self-pay | Admitting: Obstetrics and Gynecology

## 2012-05-25 DIAGNOSIS — Z98891 History of uterine scar from previous surgery: Secondary | ICD-10-CM

## 2012-05-25 HISTORY — DX: History of uterine scar from previous surgery: Z98.891

## 2012-05-25 SURGERY — Surgical Case
Anesthesia: Epidural | Site: Abdomen | Wound class: Clean Contaminated

## 2012-05-25 MED ORDER — LANOLIN HYDROUS EX OINT: 1.0000 "application " | TOPICAL_OINTMENT | CUTANEOUS | Status: DC | PRN

## 2012-05-25 MED ORDER — ZOLPIDEM TARTRATE 5 MG PO TABS: 5.0000 mg | ORAL_TABLET | Freq: Every evening | ORAL | Status: DC | PRN

## 2012-05-25 MED ORDER — LACTATED RINGERS IV SOLN
INTRAVENOUS | Status: DC | PRN
  Administered 2012-05-25 (×3): via INTRAVENOUS

## 2012-05-25 MED ORDER — KETOROLAC TROMETHAMINE 30 MG/ML IJ SOLN
30.0000 mg | Freq: Four times a day (QID) | INTRAMUSCULAR | Status: AC | PRN
  Administered 2012-05-25 (×2): 30 mg via INTRAVENOUS
  Filled 2012-05-25: qty 1

## 2012-05-25 MED ORDER — DIPHENHYDRAMINE HCL 25 MG PO CAPS: 25.0000 mg | ORAL_CAPSULE | Freq: Four times a day (QID) | ORAL | Status: DC | PRN

## 2012-05-25 MED ORDER — HYDROXYZINE PAMOATE 25 MG PO CAPS
25.0000 mg | ORAL_CAPSULE | Freq: Every morning | ORAL | Status: DC
  Filled 2012-05-25: qty 1

## 2012-05-25 MED ORDER — DIBUCAINE 1 % RE OINT: 1.0000 "application " | TOPICAL_OINTMENT | RECTAL | Status: DC | PRN

## 2012-05-25 MED ORDER — NALBUPHINE HCL 10 MG/ML IJ SOLN
5.0000 mg | INTRAMUSCULAR | Status: DC | PRN
  Filled 2012-05-25: qty 1

## 2012-05-25 MED ORDER — IBUPROFEN 800 MG PO TABS
800.0000 mg | ORAL_TABLET | Freq: Three times a day (TID) | ORAL | Status: DC
  Administered 2012-05-26 – 2012-05-27 (×6): 800 mg via ORAL
  Filled 2012-05-25 (×6): qty 1

## 2012-05-25 MED ORDER — SCOPOLAMINE 1 MG/3DAYS TD PT72: 1.0000 | MEDICATED_PATCH | Freq: Once | TRANSDERMAL | Status: DC

## 2012-05-25 MED ORDER — SODIUM BICARBONATE 8.4 % IV SOLN
INTRAVENOUS | Status: DC | PRN
  Administered 2012-05-25: 5 mL via EPIDURAL

## 2012-05-25 MED ORDER — MORPHINE SULFATE (PF) 0.5 MG/ML IJ SOLN
INTRAMUSCULAR | Status: DC | PRN
  Administered 2012-05-25: 3 mg via EPIDURAL
  Administered 2012-05-25: 2 mg via INTRAVENOUS

## 2012-05-25 MED ORDER — MENTHOL 3 MG MT LOZG: 1.0000 | LOZENGE | OROMUCOSAL | Status: DC | PRN

## 2012-05-25 MED ORDER — OXYCODONE-ACETAMINOPHEN 5-325 MG PO TABS
1.0000 | ORAL_TABLET | ORAL | Status: DC | PRN
  Administered 2012-05-25: 2 via ORAL
  Administered 2012-05-25: 1 via ORAL
  Administered 2012-05-26 (×2): 2 via ORAL
  Filled 2012-05-25 (×3): qty 2
  Filled 2012-05-25: qty 1

## 2012-05-25 MED ORDER — SIMETHICONE 80 MG PO CHEW: 80.0000 mg | CHEWABLE_TABLET | ORAL | Status: DC | PRN

## 2012-05-25 MED ORDER — CEFAZOLIN SODIUM 1-5 GM-% IV SOLN
INTRAVENOUS | Status: DC | PRN
  Administered 2012-05-25: 2 g via INTRAVENOUS

## 2012-05-25 MED ORDER — MEPERIDINE HCL 25 MG/ML IJ SOLN
INTRAMUSCULAR | Status: DC | PRN
  Administered 2012-05-25: 25 mg via INTRAVENOUS

## 2012-05-25 MED ORDER — SODIUM CHLORIDE 0.9 % IJ SOLN: 3.0000 mL | INTRAMUSCULAR | Status: DC | PRN

## 2012-05-25 MED ORDER — KETOROLAC TROMETHAMINE 30 MG/ML IJ SOLN
INTRAMUSCULAR | Status: AC
  Administered 2012-05-25: 30 mg via INTRAVENOUS
  Filled 2012-05-25: qty 1

## 2012-05-25 MED ORDER — PRENATAL MULTIVITAMIN CH: 1.0000 | ORAL_TABLET | Freq: Every day | ORAL | Status: DC

## 2012-05-25 MED ORDER — OXYTOCIN 10 UNIT/ML IJ SOLN
INTRAMUSCULAR | Status: AC
  Filled 2012-05-25: qty 4

## 2012-05-25 MED ORDER — LACTATED RINGERS IV SOLN: INTRAVENOUS | Status: DC

## 2012-05-25 MED ORDER — MORPHINE SULFATE 0.5 MG/ML IJ SOLN
INTRAMUSCULAR | Status: AC
  Filled 2012-05-25: qty 10

## 2012-05-25 MED ORDER — MEPERIDINE HCL 25 MG/ML IJ SOLN
INTRAMUSCULAR | Status: AC
  Filled 2012-05-25: qty 1

## 2012-05-25 MED ORDER — MEPERIDINE HCL 25 MG/ML IJ SOLN: 6.2500 mg | INTRAMUSCULAR | Status: DC | PRN

## 2012-05-25 MED ORDER — SIMETHICONE 80 MG PO CHEW
80.0000 mg | CHEWABLE_TABLET | Freq: Three times a day (TID) | ORAL | Status: DC
  Administered 2012-05-25 – 2012-05-28 (×11): 80 mg via ORAL

## 2012-05-25 MED ORDER — DIPHENHYDRAMINE HCL 50 MG/ML IJ SOLN: 25.0000 mg | INTRAMUSCULAR | Status: DC | PRN

## 2012-05-25 MED ORDER — OXYTOCIN 40 UNITS IN LACTATED RINGERS INFUSION - SIMPLE MED: 62.5000 mL/h | INTRAVENOUS | Status: AC

## 2012-05-25 MED ORDER — METOCLOPRAMIDE HCL 5 MG/ML IJ SOLN: 10.0000 mg | Freq: Three times a day (TID) | INTRAMUSCULAR | Status: DC | PRN

## 2012-05-25 MED ORDER — HYDROXYZINE HCL 25 MG PO TABS
25.0000 mg | ORAL_TABLET | Freq: Every day | ORAL | Status: DC
  Administered 2012-05-25 – 2012-05-28 (×4): 25 mg via ORAL
  Filled 2012-05-25 (×4): qty 1

## 2012-05-25 MED ORDER — KETOROLAC TROMETHAMINE 30 MG/ML IJ SOLN: 30.0000 mg | Freq: Four times a day (QID) | INTRAMUSCULAR | Status: AC | PRN

## 2012-05-25 MED ORDER — PRENATAL MULTIVITAMIN CH
1.0000 | ORAL_TABLET | Freq: Every morning | ORAL | Status: DC
  Administered 2012-05-25 – 2012-05-28 (×3): 1 via ORAL
  Filled 2012-05-25 (×3): qty 1

## 2012-05-25 MED ORDER — HALOPERIDOL 0.5 MG PO TABS
0.5000 mg | ORAL_TABLET | Freq: Every evening | ORAL | Status: DC | PRN
  Administered 2012-05-26 – 2012-05-27 (×2): 0.5 mg via ORAL
  Filled 2012-05-25 (×6): qty 1

## 2012-05-25 MED ORDER — WITCH HAZEL-GLYCERIN EX PADS: 1.0000 "application " | MEDICATED_PAD | CUTANEOUS | Status: DC | PRN

## 2012-05-25 MED ORDER — FENTANYL CITRATE 0.05 MG/ML IJ SOLN: 25.0000 ug | INTRAMUSCULAR | Status: DC | PRN

## 2012-05-25 MED ORDER — NALOXONE HCL 0.4 MG/ML IJ SOLN: 0.4000 mg | INTRAMUSCULAR | Status: DC | PRN

## 2012-05-25 MED ORDER — FLUOXETINE HCL 20 MG PO CAPS
20.0000 mg | ORAL_CAPSULE | Freq: Every day | ORAL | Status: DC
  Administered 2012-05-25 – 2012-05-28 (×4): 20 mg via ORAL
  Filled 2012-05-25 (×4): qty 1

## 2012-05-25 MED ORDER — DIPHENHYDRAMINE HCL 50 MG/ML IJ SOLN: 12.5000 mg | INTRAMUSCULAR | Status: DC | PRN

## 2012-05-25 MED ORDER — CEFAZOLIN SODIUM-DEXTROSE 2-3 GM-% IV SOLR
2.0000 g | INTRAVENOUS | Status: DC
  Filled 2012-05-25: qty 50

## 2012-05-25 MED ORDER — TETANUS-DIPHTH-ACELL PERTUSSIS 5-2.5-18.5 LF-MCG/0.5 IM SUSP: 0.5000 mL | Freq: Once | INTRAMUSCULAR | Status: DC

## 2012-05-25 MED ORDER — ONDANSETRON HCL 4 MG/2ML IJ SOLN: 4.0000 mg | INTRAMUSCULAR | Status: DC | PRN

## 2012-05-25 MED ORDER — IBUPROFEN 600 MG PO TABS
600.0000 mg | ORAL_TABLET | Freq: Four times a day (QID) | ORAL | Status: DC | PRN
  Filled 2012-05-25: qty 1

## 2012-05-25 MED ORDER — 0.9 % SODIUM CHLORIDE (POUR BTL) OPTIME
TOPICAL | Status: DC | PRN
  Administered 2012-05-25: 1000 mL

## 2012-05-25 MED ORDER — DIPHENHYDRAMINE HCL 25 MG PO CAPS
25.0000 mg | ORAL_CAPSULE | ORAL | Status: DC | PRN
  Filled 2012-05-25: qty 1

## 2012-05-25 MED ORDER — LACTATED RINGERS IV SOLN
INTRAVENOUS | Status: DC | PRN
  Administered 2012-05-25: 07:00:00 via INTRAVENOUS

## 2012-05-25 MED ORDER — SENNOSIDES-DOCUSATE SODIUM 8.6-50 MG PO TABS
2.0000 | ORAL_TABLET | Freq: Every day | ORAL | Status: DC
  Administered 2012-05-25 – 2012-05-27 (×3): 2 via ORAL

## 2012-05-25 MED ORDER — ONDANSETRON HCL 4 MG/2ML IJ SOLN
INTRAMUSCULAR | Status: AC
  Filled 2012-05-25: qty 2

## 2012-05-25 MED ORDER — ONDANSETRON HCL 4 MG/2ML IJ SOLN
INTRAMUSCULAR | Status: DC | PRN
  Administered 2012-05-25: 4 mg via INTRAVENOUS

## 2012-05-25 MED ORDER — OXYTOCIN 10 UNIT/ML IJ SOLN
40.0000 [IU] | INTRAVENOUS | Status: DC | PRN
  Administered 2012-05-25: 40 [IU] via INTRAVENOUS

## 2012-05-25 MED ORDER — ONDANSETRON HCL 4 MG PO TABS: 4.0000 mg | ORAL_TABLET | ORAL | Status: DC | PRN

## 2012-05-25 MED ORDER — SODIUM CHLORIDE 0.9 % IV SOLN
1.0000 ug/kg/h | INTRAVENOUS | Status: DC | PRN
  Filled 2012-05-25: qty 2.5

## 2012-05-25 MED ORDER — ONDANSETRON HCL 4 MG/2ML IJ SOLN: 4.0000 mg | Freq: Three times a day (TID) | INTRAMUSCULAR | Status: DC | PRN

## 2012-05-25 SURGICAL SUPPLY — 35 items
BENZOIN TINCTURE PRP APPL 2/3 (GAUZE/BANDAGES/DRESSINGS) ×2 IMPLANT
CHLORAPREP W/TINT 26ML (MISCELLANEOUS) ×2 IMPLANT
CLOTH BEACON ORANGE TIMEOUT ST (SAFETY) ×2 IMPLANT
CONTAINER PREFILL 10% NBF 15ML (MISCELLANEOUS) IMPLANT
DRESSING TELFA 8X3 (GAUZE/BANDAGES/DRESSINGS) ×2 IMPLANT
DRSG COVADERM 4X10 (GAUZE/BANDAGES/DRESSINGS) ×2 IMPLANT
ELECT REM PT RETURN 9FT ADLT (ELECTROSURGICAL) ×2
ELECTRODE REM PT RTRN 9FT ADLT (ELECTROSURGICAL) ×1 IMPLANT
EXTRACTOR VACUUM M CUP 4 TUBE (SUCTIONS) ×2 IMPLANT
GLOVE BIO SURGEON STRL SZ 6.5 (GLOVE) ×2 IMPLANT
GLOVE BIO SURGEON STRL SZ7 (GLOVE) ×2 IMPLANT
GOWN PREVENTION PLUS LG XLONG (DISPOSABLE) ×6 IMPLANT
KIT ABG SYR 3ML LUER SLIP (SYRINGE) IMPLANT
NEEDLE HYPO 25X5/8 SAFETYGLIDE (NEEDLE) IMPLANT
NS IRRIG 1000ML POUR BTL (IV SOLUTION) ×2 IMPLANT
PACK C SECTION WH (CUSTOM PROCEDURE TRAY) ×2 IMPLANT
PAD ABD 7.5X8 STRL (GAUZE/BANDAGES/DRESSINGS) ×2 IMPLANT
RTRCTR C-SECT PINK 25CM LRG (MISCELLANEOUS) ×2 IMPLANT
SLEEVE SCD COMPRESS KNEE MED (MISCELLANEOUS) ×2 IMPLANT
STAPLER VISISTAT 35W (STAPLE) IMPLANT
STRIP CLOSURE SKIN 1/2X4 (GAUZE/BANDAGES/DRESSINGS) ×2 IMPLANT
SUT MNCRL 0 VIOLET CTX 36 (SUTURE) ×2 IMPLANT
SUT MONOCRYL 0 CTX 36 (SUTURE) ×2
SUT PLAIN 1 NONE 54 (SUTURE) IMPLANT
SUT PLAIN 2 0 XLH (SUTURE) ×2 IMPLANT
SUT VIC AB 0 CT1 27 (SUTURE) ×2
SUT VIC AB 0 CT1 27XBRD ANBCTR (SUTURE) ×2 IMPLANT
SUT VIC AB 2-0 CT1 27 (SUTURE) ×1
SUT VIC AB 2-0 CT1 TAPERPNT 27 (SUTURE) ×1 IMPLANT
SUT VIC AB 3-0 FS2 27 (SUTURE) ×2 IMPLANT
SYR BULB IRRIGATION 50ML (SYRINGE) IMPLANT
TAPE CLOTH SURG 4X10 WHT LF (GAUZE/BANDAGES/DRESSINGS) ×2 IMPLANT
TOWEL OR 17X24 6PK STRL BLUE (TOWEL DISPOSABLE) ×4 IMPLANT
TRAY FOLEY CATH 14FR (SET/KITS/TRAYS/PACK) IMPLANT
WATER STERILE IRR 1000ML POUR (IV SOLUTION) ×2 IMPLANT

## 2012-05-25 NOTE — Brief Op Note (Signed)
05/24/2012 - 05/25/2012  7:43 AM  PATIENT:  Stacy Moss  21 y.o. female  PRE-OPERATIVE DIAGNOSIS:  failure to progress, fetal intorance to labor  POST-OPERATIVE DIAGNOSIS:  failure to progress, fetal intorance to labor  PROCEDURE:  Procedure(s) (LRB): CESAREAN SECTION (N/A)  SURGEON:  Surgeon(s) and Role:    * Dwayna Kentner Bovard, MD - Primary  ANESTHESIA:   epidural  EBL:  Total I/O In: 1000 [I.V.:1000] Out: 600 [Urine:100; Blood:500]  FINDINGS:  Viable female infant at 7am, apgars 8/9, weight P, nl uterus, tubes, ovaries  BLOOD ADMINISTERED:none  DRAINS: Urinary Catheter (Foley)   LOCAL MEDICATIONS USED:  NONE  SPECIMEN:  Source of Specimen:  Placenta  DISPOSITION OF SPECIMEN:  L&D  COUNTS:  YES  TOURNIQUET:  * No tourniquets in log *  DICTATION: .Other Dictation: Dictation Number [id number]  PLAN OF CARE: Admit to inpatient   PATIENT DISPOSITION:  PACU - hemodynamically stable.   Delay start of Pharmacological VTE agent (>24hrs) due to surgical blood loss or risk of bleeding: not applicable   Br/AB+/Nexplanon

## 2012-05-25 NOTE — Progress Notes (Signed)
Dr. Ellyn Hack in the department and notified of pt status, FHR, decelerations, RN interventions and SVE. Will continue to monitor.

## 2012-05-25 NOTE — Progress Notes (Signed)
Dr. Ellyn Hack at the bedside to discuss risks and benefits of caesarean  section delivery. Pt states understanding.

## 2012-05-25 NOTE — Progress Notes (Signed)
CSW continues to follow, awaiting results from infant UDS and will do complete consult with MOB.

## 2012-05-25 NOTE — Progress Notes (Signed)
Dr. Ellyn Hack on the phone and notified of pt status, FHR, decelerations,, nursing interventions, and SVE. Orders received to cut pitocin level in half. Will continue to monitor.

## 2012-05-25 NOTE — OR Nursing (Addendum)
Uterus massaged by S. Sheccid Lahmann Charity fundraiser. Two tubes of cord blood to lab. Foley catheter in place upon arrival to OR.  50cc of blood evacuated from uterus during uterine massage.

## 2012-05-25 NOTE — Transfer of Care (Signed)
Immediate Anesthesia Transfer of Care Note  Patient: Stacy Moss  Procedure(s) Performed: Procedure(s) (LRB): CESAREAN SECTION (N/A)  Patient Location: PACU  Anesthesia Type: Epidural  Level of Consciousness: awake, alert  and oriented  Airway & Oxygen Therapy: Patient Spontanous Breathing  Post-op Assessment: Report given to PACU RN and Post -op Vital signs reviewed and stable  Post vital signs: Reviewed and stable  Complications: No apparent anesthesia complications

## 2012-05-25 NOTE — Progress Notes (Signed)
Patient ID: Stacy Moss, female   DOB: 1991-03-06, 21 y.o.   MRN: [id number] Pt comfortable with epidural AFVSS gen NAD FHT130, early/severe variable decels toco irr  SVE 4/90/0 - largely unchanged in 7 hrs  D/w pt r/b/a of LTCS inc but not limited to bleeding, infection, damage to surrounding organs, trouble healing, injury to infant.  Pt voices understanding, wishes to proceed,

## 2012-05-25 NOTE — Anesthesia Postprocedure Evaluation (Signed)
  Anesthesia Post-op Note  Patient: Stacy Moss  Procedure(s) Performed: Procedure(s) (LRB): CESAREAN SECTION (N/A)  Patient Location: PACU  Anesthesia Type: Epidural  Level of Consciousness: awake, alert  and oriented  Airway and Oxygen Therapy: Patient Spontanous Breathing  Post-op Pain: none  Post-op Assessment: Post-op Vital signs reviewed, Patient's Cardiovascular Status Stable, Respiratory Function Stable, Patent Airway, No signs of Nausea or vomiting, Pain level controlled, No headache and No backache  Post-op Vital Signs: Reviewed and stable  Complications: No apparent anesthesia complications

## 2012-05-26 ENCOUNTER — Encounter (HOSPITAL_COMMUNITY): Payer: Self-pay | Admitting: Obstetrics and Gynecology

## 2012-05-26 LAB — CBC
HCT: 23.6 % — ABNORMAL LOW (ref 36.0–46.0)
Hemoglobin: 8 g/dL — ABNORMAL LOW (ref 12.0–15.0)
RDW: 12.5 % (ref 11.5–15.5)
WBC: 16.6 10*3/uL — ABNORMAL HIGH (ref 4.0–10.5)

## 2012-05-26 MED ORDER — HYDROCODONE-ACETAMINOPHEN 5-325 MG PO TABS
1.0000 | ORAL_TABLET | Freq: Four times a day (QID) | ORAL | Status: DC | PRN
  Administered 2012-05-26: 1 via ORAL
  Administered 2012-05-27 (×2): 2 via ORAL
  Administered 2012-05-27: 1 via ORAL
  Administered 2012-05-27 – 2012-05-28 (×2): 2 via ORAL
  Filled 2012-05-26 (×2): qty 2
  Filled 2012-05-26: qty 1
  Filled 2012-05-26 (×4): qty 2

## 2012-05-26 NOTE — Anesthesia Postprocedure Evaluation (Signed)
  Anesthesia Post-op Note  Patient: Stacy Moss  Procedure(s) Performed: Procedure(s) (LRB): CESAREAN SECTION (N/A)  Patient Location: Mother/Baby  Anesthesia Type: Regional  Level of Consciousness: awake, alert  and oriented  Airway and Oxygen Therapy: Patient Spontanous Breathing  Post-op Pain: mild  Post-op Assessment: Patient's Cardiovascular Status Stable and Respiratory Function Stable  Post-op Vital Signs: Reviewed and stable  Complications: No apparent anesthesia complications

## 2012-05-26 NOTE — Op Note (Signed)
NAMESHAREECE, BULTMAN NO.:  [id number]  MEDICAL RECORD NO.:  [id number]  LOCATION:  9129                          FACILITY:  [facility]  PHYSICIAN:  Sherron Monday, MD        DATE OF BIRTH:  [birth year]  DATE OF PROCEDURE:  05/25/2012 DATE OF DISCHARGE:                              OPERATIVE REPORT   PREOPERATIVE DIAGNOSIS:  Intrauterine pregnancy at term, spontaneous rupture of membranes, failure to progress, fetal intolerance of labor.  POSTOPERATIVE DIAGNOSIS:  Intrauterine pregnancy at term, spontaneous rupture of membranes, failure to progress, fetal intolerance of labor, delivered.  PROCEDURE:  Low transverse cesarean section.  SURGEON:  Sherron Monday, MD  ANESTHESIA:  Epidural.  EBL:  500 mL.  IV FLUIDS:  1000 mL.  URINE OUTPUT:  100 mL clear urine at the end of the procedure.  FINDINGS:  Viable female infant at 7 a.m. with Apgars of 8 at 1 minute and 9 at 5 minutes.  The weight was pending at this time of dictation. Normal uterus, tubes, and ovaries noted.  COMPLICATIONS:  None.  PATHOLOGY:  Placenta to labor and delivery.  PROCEDURE:  After informed consent was reviewed with the patient including risks, benefits, and alternatives of the surgical procedure. She was transported to the OR, placed on the table.  After her epidural had been dosed and she was placed in a table in supine position in a leftward tilt, prepped and draped in the normal sterile fashion.  Her Foley catheter was maintained.  Pfannenstiel skin incision was made at the level of approximately 2 fingerbreadths above the pubic symphysis, carried through the underlying layer of fascia sharply.  Fascia was incised in the midline.  Incision was extended laterally with Mayo scissors.  The inferior aspect of the fascial incision was grasped with Kocher clamps, elevating the rectus muscles were dissected off both bluntly and sharply.  Attention was turned to the superior portion which in a  similar fashion was grasped, elevated, and the rectus muscles were dissected off both bluntly and sharply.  Peritoneum was entered bluntly. This incision was extended superiorly and inferiorly with good visualization of the bladder.  Alexis skin retractor was placed carefully making sure no bowel was entrapped.  The uterus was inspected. The vesicouterine peritoneum was easily identified, tented up with smooth pickups.  The bladder flap was created both digitally and sharply.  The uterus was incised in a transverse fashion.  The infant was delivered from vertex presentation.  With the aid of the vacuum, nose and mouth were suctioned on the field.  Cord was clamped and cut. Infant was handed off to the awaiting pediatric staff.  The placenta was expressed.  Uterus was cleared of all clot and debris.  Uterine incision was closed with 2 layers of 0 Monocryl the first of which was running and locked and the second as an imbricating layer.  The incision was found to be hemostatic.  Copious pelvic irrigation was performed.  The peritoneum was reapproximated as well as the rectus muscles using 2-0 Vicryl.  The fascia was closed using a single suture of 0 Vicryl.  The subcuticular adipose layer was irrigated and made hemostatic with  Bovie cautery.  The dead space was then closed with 3-0 plain gut.  The skin was closed with 3-0 Vicryl in subcuticular fashion.  Benzoin and Steri- Strips were applied.  The patient tolerated the procedure well.  Sponge, lap, and needle counts were correct x2 at the end of procedure.     Sherron Monday, MD     JB/MEDQ  D:  05/25/2012  T:  05/26/2012  Job:  [id number]

## 2012-05-26 NOTE — Progress Notes (Signed)
Ur chart review completed.  

## 2012-05-26 NOTE — Progress Notes (Signed)
Subjective: Postpartum Day 1: Cesarean Delivery Patient reports incisional pain and tolerating PO.  Nl lochia, pain controlled  Objective: Vital signs in last 24 hours: Temp:  [97.9 F (36.6 C)-98.8 F (37.1 C)] 98 F (36.7 C) (08/05 0645) Pulse Rate:  [74-110] 84  (08/05 0645) Resp:  [16-19] 18  (08/05 0645) BP: (111-134)/(58-75) 111/67 mmHg (08/05 0645) SpO2:  [98 %-100 %] 100 % (08/05 0645)  Physical Exam:  General: alert and no distress Lochia: appropriate Uterine Fundus: firm Incision: healing well DVT Evaluation: No evidence of DVT seen on physical exam.   Basename 05/26/12 0510 05/24/12 1435  HGB 8.0* 9.7*  HCT 23.6* 28.5*    Assessment/Plan: Status post Cesarean section. Doing well postoperatively.  Continue current care.  BOVARD,Mckynlie Vanderslice 05/26/2012, 7:56 AM

## 2012-05-26 NOTE — Addendum Note (Signed)
Addendum  created 05/26/12 [id number] by Earmon Phoenix, CRNA   Modules edited:Notes Section

## 2012-05-27 NOTE — Progress Notes (Signed)
Subjective: Postpartum Day 2: Cesarean Delivery Patient reports incisional pain, tolerating PO and no problems voiding.  Nl lochia, pain controlled.    Objective: Vital signs in last 24 hours: Temp:  [97.9 F (36.6 C)-98.6 F (37 C)] 98 F (36.7 C) (08/06 0605) Pulse Rate:  [91-99] 91  (08/06 0605) Resp:  [20] 20  (08/06 0605) BP: (110-138)/(75-79) 110/75 mmHg (08/06 1610)  Physical Exam:  General: alert and no distress Lochia: appropriate Uterine Fundus: firm Incision: healing well DVT Evaluation: No evidence of DVT seen on physical exam.   Basename 05/26/12 0510 05/24/12 1435  HGB 8.0* 9.7*  HCT 23.6* 28.5*    Assessment/Plan: Status post Cesarean section. Doing well postoperatively.  Continue current care.  BOVARD,Jamerius Boeckman 05/27/2012, 8:21 AM

## 2012-05-28 MED ORDER — PRENATAL MULTIVITAMIN CH: 1.0000 | ORAL_TABLET | Freq: Every morning | ORAL | Status: DC

## 2012-05-28 MED ORDER — PNEUMOCOCCAL VAC POLYVALENT 25 MCG/0.5ML IJ INJ
0.5000 mL | INJECTION | INTRAMUSCULAR | Status: AC
  Administered 2012-05-28: 0.5 mL via INTRAMUSCULAR
  Filled 2012-05-28 (×2): qty 0.5

## 2012-05-28 MED ORDER — HYDROCODONE-ACETAMINOPHEN 5-325 MG PO TABS: 1.0000 | ORAL_TABLET | Freq: Four times a day (QID) | ORAL | Status: DC | PRN

## 2012-05-28 MED ORDER — IBUPROFEN 800 MG PO TABS: 800.0000 mg | ORAL_TABLET | Freq: Three times a day (TID) | ORAL | Status: DC

## 2012-05-28 NOTE — Discharge Summary (Signed)
Obstetric Discharge Summary Reason for Admission: rupture of membranes Prenatal Procedures: none Intrapartum Procedures: cesarean: low cervical, transverse Postpartum Procedures: none Complications-Operative and Postpartum: none Hemoglobin  Date Value Range Status  05/26/2012 8.0* 12.0 - 15.0 g/dL Final     DELTA CHECK NOTED     REPEATED TO VERIFY     HCT  Date Value Range Status  05/26/2012 23.6* 36.0 - 46.0 % Final    Physical Exam:  General: alert and no distress Lochia: appropriate Uterine Fundus: firm Incision: healing well DVT Evaluation: No evidence of DVT seen on physical exam.  Discharge Diagnoses: Term Pregnancy-delivered  Discharge Information: Date: 05/28/2012 Activity: pelvic rest Diet: routine Medications: PNV, Ibuprofen and Vicodin Condition: stable Instructions: refer to practice specific booklet Discharge to: home Follow-up Information    Follow up with BOVARD,Myleen Brailsford, MD. Schedule an appointment as soon as possible for a visit in 2 weeks.   Contact information:   [address] [facility] [address] [place] [address] [phone]          Newborn Data: Live born female  Birth Weight: 5 lb 11.2 oz (2585 g) APGAR: 8, 9  Home with mother.  BOVARD,Tsuneo Faison 05/28/2012, 9:33 AM

## 2012-05-28 NOTE — Progress Notes (Signed)
Subjective: Postpartum Day 3: Cesarean Delivery Patient reports incisional pain and tolerating PO.  Nl lochia, pain controlled  Objective: Vital signs in last 24 hours: Temp:  [97.8 F (36.6 C)-98.8 F (37.1 C)] 98.8 F (37.1 C) (08/07 0556) Pulse Rate:  [88-97] 97  (08/07 0556) Resp:  [18-20] 18  (08/07 0556) BP: (130-139)/(76-81) 130/76 mmHg (08/07 0556) SpO2:  [99 %] 99 % (08/06 1521)  Physical Exam:  General: alert and no distress Lochia: appropriate Uterine Fundus: firm Incision: healing well DVT Evaluation: No evidence of DVT seen on physical exam.   Basename 05/26/12 0510  HGB 8.0*  HCT 23.6*    Assessment/Plan: Status post Cesarean section. Doing well postoperatively.  Discharge home with standard precautions and return to clinic in 2 weeks.  D/C with Motrin/percocet/pnv,  BOVARD,Stacy Moss 05/28/2012, 9:02 AM
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
## 2012-06-03 ENCOUNTER — Encounter (HOSPITAL_COMMUNITY): Payer: Self-pay | Admitting: *Deleted

## 2012-06-03 ENCOUNTER — Inpatient Hospital Stay (HOSPITAL_COMMUNITY)
Admission: AD | Admit: 2012-06-03 | Discharge: 2012-06-03 | Disposition: A | Discharge status: Home / Self Care | Payer: Medicaid Other | Source: Ambulatory Visit | Attending: Obstetrics and Gynecology | Admitting: Obstetrics and Gynecology

## 2012-06-03 DIAGNOSIS — O99345 Other mental disorders complicating the puerperium: Secondary | ICD-10-CM | POA: Insufficient documentation

## 2012-06-03 DIAGNOSIS — Z9889 Other specified postprocedural states: Secondary | ICD-10-CM

## 2012-06-03 DIAGNOSIS — Z98891 History of uterine scar from previous surgery: Secondary | ICD-10-CM

## 2012-06-03 DIAGNOSIS — F53 Postpartum depression: Secondary | ICD-10-CM

## 2012-06-03 DIAGNOSIS — F329 Major depressive disorder, single episode, unspecified: Secondary | ICD-10-CM | POA: Insufficient documentation

## 2012-06-03 DIAGNOSIS — F3289 Other specified depressive episodes: Secondary | ICD-10-CM | POA: Insufficient documentation

## 2012-06-03 DIAGNOSIS — O165 Unspecified maternal hypertension, complicating the puerperium: Secondary | ICD-10-CM

## 2012-06-03 DIAGNOSIS — IMO0001 Reserved for inherently not codable concepts without codable children: Secondary | ICD-10-CM | POA: Insufficient documentation

## 2012-06-03 HISTORY — DX: Depression, unspecified: F32.A

## 2012-06-03 HISTORY — DX: Other specified behavioral and emotional disorders with onset usually occurring in childhood and adolescence: F98.8

## 2012-06-03 HISTORY — DX: Mental disorder, not otherwise specified: F99

## 2012-06-03 HISTORY — DX: Essential (primary) hypertension: I10

## 2012-06-03 HISTORY — DX: Major depressive disorder, single episode, unspecified: F32.9

## 2012-06-03 HISTORY — DX: Unspecified convulsions: R56.9

## 2012-06-03 HISTORY — DX: Anxiety disorder, unspecified: F41.9

## 2012-06-03 HISTORY — DX: Bipolar disorder, unspecified: F31.9

## 2012-06-03 LAB — COMPREHENSIVE METABOLIC PANEL
ALT: 25 U/L (ref 0–35)
Alkaline Phosphatase: 81 U/L (ref 39–117)
CO2: 24 mEq/L (ref 19–32)
GFR calc Af Amer: >90 mL/min (ref >90)
GFR calc non Af Amer: >90 mL/min (ref >90)
Glucose, Bld: 77 mg/dL (ref 70–99)
Potassium: 4.1 mEq/L (ref 3.5–5.1)
Sodium: 138 mEq/L (ref 135–145)

## 2012-06-03 LAB — CBC
Hemoglobin: 9.5 g/dL — ABNORMAL LOW (ref 12.0–15.0)
MCH: 30 pg (ref 26.0–34.0)
RBC: 3.17 MIL/uL — ABNORMAL LOW (ref 3.87–5.11)

## 2012-06-03 MED ORDER — IBUPROFEN 800 MG PO TABS: 800.0000 mg | ORAL_TABLET | Freq: Three times a day (TID) | ORAL | Status: AC | PRN

## 2012-06-03 MED ORDER — BUPROPION HCL ER (SR) 150 MG PO TB12: 150.0000 mg | ORAL_TABLET | Freq: Two times a day (BID) | ORAL | Status: DC

## 2012-06-03 MED ORDER — FLUOXETINE HCL 20 MG PO TABS: 20.0000 mg | ORAL_TABLET | Freq: Every day | ORAL | Status: AC

## 2012-06-03 MED ORDER — OXYCODONE-ACETAMINOPHEN 5-325 MG PO TABS: 1.0000 | ORAL_TABLET | ORAL | Status: AC | PRN
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
## 2012-06-03 NOTE — MAU Note (Signed)
Has to go up and down the steps where she lives, neck, back and legs ache... "just has to push through the pain"

## 2012-06-03 NOTE — MAU Note (Signed)
Patient states she had a primary cesarean section on 8-4. States the home health nurse got a high blood pressure of 170/110 and she went to office and her BP was 165/92. States she has been having a headache and seeing spots with a lot of swelling. Patient states she has been having some feelings of depression, not as bad today as yesterday. States she does not have a lot of help at home and has feelings of giving up. States she has no ideation of hurting anyone as of now.

## 2012-06-03 NOTE — MAU Note (Signed)
Sent in for BP eval. C/s on 08/04.  Expressed disappointment regarding breastfeeding, stopped a few days ago- is still leaking. (discussed support class and breast feeding class offered by hosp)

## 2013-10-22 NOTE — L&D Delivery Note (Signed)
Delivery Note Once the pt;s labor finally became adequate, she progressed rapidly and was noted to be C/C/+3.  After a 3 contraction 2nd stage, At 6:30 AM a viable female was delivered via VBAC, Spontaneous (Presentation: Left Occiput Anterior).  APGAR:8/9; weight   6#14oz .   Placenta status: Intact, Spontaneous.  Cord: 3 vessels with the following complications: None.   Anesthesia: Epidural  Episiotomy: None Lacerations: None Suture Repair: n/a Est. Blood Loss (mL): 100  Mom to postpartum.  Baby to Couplet care / Skin to Skin.  CRESENZO-DISHMAN,Jashanti Clinkscale 09/03/2014, 6:52 AM

## 2014-01-10 ENCOUNTER — Emergency Department (HOSPITAL_COMMUNITY)
Admission: EM | Admit: 2014-01-10 | Discharge: 2014-01-10 | Disposition: A | Discharge status: Home / Self Care | Payer: Medicaid Other | Attending: Emergency Medicine | Admitting: Emergency Medicine

## 2014-01-10 ENCOUNTER — Encounter (HOSPITAL_COMMUNITY): Payer: Self-pay | Admitting: Emergency Medicine

## 2014-01-10 ENCOUNTER — Emergency Department (HOSPITAL_COMMUNITY): Payer: Medicaid Other

## 2014-01-10 DIAGNOSIS — Z9889 Other specified postprocedural states: Secondary | ICD-10-CM | POA: Insufficient documentation

## 2014-01-10 DIAGNOSIS — W2209XA Striking against other stationary object, initial encounter: Secondary | ICD-10-CM | POA: Insufficient documentation

## 2014-01-10 DIAGNOSIS — O99330 Smoking (tobacco) complicating pregnancy, unspecified trimester: Secondary | ICD-10-CM | POA: Insufficient documentation

## 2014-01-10 DIAGNOSIS — O169 Unspecified maternal hypertension, unspecified trimester: Secondary | ICD-10-CM | POA: Insufficient documentation

## 2014-01-10 DIAGNOSIS — Z8659 Personal history of other mental and behavioral disorders: Secondary | ICD-10-CM | POA: Insufficient documentation

## 2014-01-10 DIAGNOSIS — O9989 Other specified diseases and conditions complicating pregnancy, childbirth and the puerperium: Secondary | ICD-10-CM | POA: Insufficient documentation

## 2014-01-10 DIAGNOSIS — Y929 Unspecified place or not applicable: Secondary | ICD-10-CM | POA: Insufficient documentation

## 2014-01-10 DIAGNOSIS — Z8669 Personal history of other diseases of the nervous system and sense organs: Secondary | ICD-10-CM | POA: Insufficient documentation

## 2014-01-10 DIAGNOSIS — Y9389 Activity, other specified: Secondary | ICD-10-CM | POA: Insufficient documentation

## 2014-01-10 DIAGNOSIS — S62639A Displaced fracture of distal phalanx of unspecified finger, initial encounter for closed fracture: Secondary | ICD-10-CM | POA: Insufficient documentation

## 2014-01-10 LAB — POC URINE PREG, ED: PREG TEST UR: POSITIVE — AB

## 2014-01-10 MED ORDER — ACETAMINOPHEN 325 MG PO TABS
650.0000 mg | ORAL_TABLET | Freq: Once | ORAL | Status: AC
  Administered 2014-01-10: 650 mg via ORAL
  Filled 2014-01-10: qty 2

## 2014-01-10 NOTE — ED Notes (Addendum)
Pt arrived with a complaint of hitting the wall with her left hand.  Pt is unable to move her fingers. Pt hand appears swollen.  Pt states she is pregnant.

## 2014-01-10 NOTE — ED Notes (Signed)
Pt stated that she cannot urinate. 

## 2014-01-10 NOTE — Discharge Instructions (Signed)
Recommend tylenol for pain control. Use ice for swelling 2-3 times per day. Maintain stability with finger splint. Follow up with hand surgeon if symptoms persist or worsen.  Finger Fracture Fractures of fingers are breaks in the bones of the fingers. There are many types of fractures. There are different ways of treating these fractures. Your health care provider will discuss the best way to treat your fracture. CAUSES Traumatic injury is the main cause of broken fingers. These include:  Injuries while playing sports.  Workplace injuries.  Falls. RISK FACTORS Activities that can increase your risk of finger fractures include:  Sports.  Workplace activities that involve machinery.  A condition called osteoporosis, which can make your bones less dense and cause them to fracture more easily. SIGNS AND SYMPTOMS The main symptoms of a broken finger are pain and swelling within 15 minutes after the injury. Other symptoms include:  Bruising of your finger.  Stiffness of your finger.  Numbness of your finger.  Exposed bones (compound fracture) if the fracture is severe. DIAGNOSIS  The best way to diagnose a broken bone is with X-ray imaging. Additionally, your health care provider will use this X-ray image to evaluate the position of the broken finger bones.  TREATMENT  Finger fractures can be treated with:   Nonreduction This means the bones are in place. The finger is splinted without changing the positions of the bone pieces. The splint is usually left on for about a week to 10 days. This will depend on your fracture and what your health care provider thinks.  Closed reduction The bones are put back into position without using surgery. The finger is then splinted.  Open reduction and internal fixation The fracture site is opened. Then the bone pieces are fixed into place with pins or some type of hardware. This is seldom required. It depends on the severity of the fracture. HOME  CARE INSTRUCTIONS   Follow your health care provider's instructions regarding activities, exercises, and physical therapy.  Only take over-the-counter or prescription medicines for pain, discomfort, or fever as directed by your health care provider. SEEK MEDICAL CARE IF: You have pain or swelling that limits the motion or use of your fingers. SEEK IMMEDIATE MEDICAL CARE IF:  Your finger becomes numb. MAKE SURE YOU:   Understand these instructions.  Will watch your condition.  Will get help right away if you are not doing well or get worse. Document Released: 01/20/2001 Document Revised: 07/29/2013 Document Reviewed: 05/20/2013 [facility] Patient Information 2014 [place]. RICE: Routine Care for Injuries The routine care of many injuries includes Rest, Ice, Compression, and Elevation (RICE). HOME CARE INSTRUCTIONS  Rest is needed to allow your body to heal. Routine activities can usually be resumed when comfortable. Injured tendons and bones can take up to 6 weeks to heal. Tendons are the cord-like structures that attach muscle to bone.  Ice following an injury helps keep the swelling down and reduces pain.  Put ice in a plastic bag.  Place a towel between your skin and the bag.  Leave the ice on for 15-20 minutes, 03-04 times a day. Do this while awake, for the first 24 to 48 hours. After that, continue as directed by your caregiver.  Compression helps keep swelling down. It also gives support and helps with discomfort. If an elastic bandage has been applied, it should be removed and reapplied every 3 to 4 hours. It should not be applied tightly, but firmly enough to keep swelling down. Watch  fingers or toes for swelling, bluish discoloration, coldness, numbness, or excessive pain. If any of these problems occur, remove the bandage and reapply loosely. Contact your caregiver if these problems continue.  Elevation helps reduce swelling and decreases pain. With extremities, such  as the arms, hands, legs, and feet, the injured area should be placed near or above the level of the heart, if possible. SEEK IMMEDIATE MEDICAL CARE IF:  You have persistent pain and swelling.  You develop redness, numbness, or unexpected weakness.  Your symptoms are getting worse rather than improving after several days. These symptoms may indicate that further evaluation or further X-rays are needed. Sometimes, X-rays may not show a small broken bone (fracture) until 1 week or 10 days later. Make a follow-up appointment with your caregiver. Ask when your X-ray results will be ready. Make sure you get your X-ray results. Document Released: 01/20/2001 Document Revised: 12/31/2011 Document Reviewed: 03/09/2011 [facility] Patient Information 2014 [place], [place].

## 2014-01-10 NOTE — ED Provider Notes (Signed)
Medical screening examination/treatment/procedure(s) were performed by non-physician practitioner and as supervising physician I was immediately available for consultation/collaboration.    Sunnie NielsenBrian Daxtyn Rottenberg, MD 01/10/14 [phone]

## 2014-01-10 NOTE — ED Provider Notes (Signed)
CSN: [id number]     Arrival date & time 01/10/14  0302 History   First MD Initiated Contact with Patient 01/10/14 [phone]     Chief Complaint  Patient presents with  . Hand Injury    (Consider location/radiation/quality/duration/timing/severity/associated sxs/prior Treatment) HPI Comments: 23 year old female comes in for left hand pain after striking a wall out of anger. Patient complaining of constant pain to her left hand with associated swelling. Patient states she is approximately one month pregnant. She has prenatal followup scheduled in 2 days.  Patient is a 23 y.o. female presenting with hand injury. The history is provided by the patient. No language interpreter was used.  Hand Injury Location:  Hand Time since incident:  5 hours Injury: yes   Hand location:  L hand Pain details:    Quality:  Aching and throbbing   Severity:  Moderate   Onset quality:  Sudden   Duration:  5 hours   Timing:  Constant   Progression:  Unchanged Chronicity:  New Dislocation: no   Prior injury to area:  No Relieved by:  None tried Worsened by:  Movement Ineffective treatments:  Rest Associated symptoms: decreased range of motion and swelling   Associated symptoms: no muscle weakness and no numbness   Risk factors: no frequent fractures     Past Medical History  Diagnosis Date  . Hx of tonsillectomy   . S/P cesarean section 05/25/2012  . Seizures     age 8/13- after fell and hit head  . Hypertension     on meds 2011  . Mental disorder   . Depression   . Bipolar affective   . ADD (attention deficit disorder)   . Anxiety    Past Surgical History  Procedure Laterality Date  . Tonsillectomy    . Cesarean section  05/25/2012    Procedure: CESAREAN SECTION;  Surgeon: Sherron MondayJody Bovard, MD;  Location: [facility];  Service: Gynecology;  Laterality: N/A;  Primary cesarean section with delivery of baby girl at 0700. Apgars9/9.   Family History  Problem Relation Age of Onset  . Anesthesia problems Neg  Hx   . Other Neg Hx    History  Substance Use Topics  . Smoking status: Current Every Day Smoker -- 0.50 packs/day for 5 years    Types: Cigarettes  . Smokeless tobacco: Never Used  . Alcohol Use: No   OB History   Grav Para Term Preterm Abortions TAB SAB Ect Mult Living   2 1 1       1       Review of Systems  Musculoskeletal: Positive for arthralgias, joint swelling and myalgias.  Skin: Negative for pallor.  Neurological: Negative for weakness and numbness.  All other systems reviewed and are negative.     Allergies  Review of patient's allergies indicates no known allergies.  Home Medications  No current outpatient prescriptions on file. BP 141/83  Pulse 101  Temp(Src) 98.2 F (36.8 C) (Oral)  Resp 24  Ht 5\' 9"  (1.753 m)  Wt 220 lb (99.791 kg)  BMI 32.47 kg/m2  SpO2 100%  Physical Exam  Nursing note and vitals reviewed. Constitutional: She is oriented to person, place, and time. She appears well-developed and well-nourished. No distress.  HENT:  Head: Normocephalic and atraumatic.  Eyes: Conjunctivae and EOM are normal. No scleral icterus.  Neck: Normal range of motion.  Cardiovascular: Normal rate, regular rhythm and intact distal pulses.   Distal radial pulses 2+ bilaterally. Capillary refill normal in all  fingers of left hand.  Pulmonary/Chest: Effort normal. No respiratory distress.  Musculoskeletal: She exhibits tenderness.       Left wrist: Normal.       Left hand: She exhibits decreased range of motion, tenderness, bony tenderness and swelling. She exhibits normal capillary refill and no deformity. Normal sensation noted. Normal strength noted.  Tenderness to palpation of the left hand diffusely, most significant to the third finger. 5/5 strength against resistance of FDS, FDP, and extensors of left hand for all except third finger which has 4/5 strength against resistance; suspect these findings to be secondary to discomfort. Mild swelling and ecchymosis  appreciated to the palmar aspect of left hand. Decreased range of motion secondary to pain.  Neurological: She is alert and oriented to person, place, and time.  No numbness, tingling or weakness of the affected extremity  Skin: Skin is warm and dry. No rash noted. She is not diaphoretic. No erythema. No pallor.  Psychiatric: She has a normal mood and affect. Her behavior is normal.    ED Course  Procedures (including critical care time) Labs Review Labs Reviewed  POC URINE PREG, ED - Abnormal; Notable for the following:    Preg Test, Ur POSITIVE (*)    All other components within normal limits   Imaging Review Dg Hand Complete Left  01/10/2014   CLINICAL DATA:  Punched a wall, left hand pain.  EXAM: LEFT HAND - COMPLETE 3+ VIEW  COMPARISON:  None.  FINDINGS: Nondisplaced intra-articular fracture involving the base of the distal phalanx of the long finger. No other fractures. Well preserved joint spaces. Well preserved bone mineral density.  IMPRESSION: Nondisplaced intra-articular fracture involving the base of the distal phalanx of the long finger.   Electronically Signed   By: Hulan Saas M.D.   On: 01/10/2014 05:15     EKG Interpretation None      MDM   Final diagnoses:  Fracture of finger, distal phalanx, left, closed    Uncomplicated fracture of distal phalanx of the left third finger secondary to "hitting a wall". Patient neurovascularly intact on physical exam. No gross sensory deficits appreciated. X-ray without significant findings. Patient's finger splinted in ED. She is stable for discharge with instruction for RICE and tylenol for pain control; patient pregnant. Hand specialist referral provided should symptoms persist or worsen. Return precautions provided and patient agreeable to plan with no unaddressed concerns. She has prenatal care scheduled in 2 days.   Filed Vitals:   01/10/14 0304  BP: 141/83  Pulse: 101  Temp: 98.2 F (36.8 C)  TempSrc: Oral  Resp:  24  Height: 5\' 9"  (1.753 m)  Weight: 220 lb (99.791 kg)  SpO2: 100%       Antony Madura, PA-C 01/10/14 [phone]

## 2014-02-05 ENCOUNTER — Encounter: Payer: Self-pay | Admitting: General Practice

## 2014-02-08 ENCOUNTER — Encounter: Payer: Self-pay | Admitting: *Deleted

## 2014-02-23 ENCOUNTER — Encounter: Payer: Medicaid Other | Admitting: Obstetrics and Gynecology

## 2014-03-17 ENCOUNTER — Ambulatory Visit (INDEPENDENT_AMBULATORY_CARE_PROVIDER_SITE_OTHER): Payer: Medicaid Other | Admitting: Advanced Practice Midwife

## 2014-03-17 ENCOUNTER — Encounter: Payer: Self-pay | Admitting: Advanced Practice Midwife

## 2014-03-17 VITALS — BP 115/79 | HR 96 | Temp 97.5°F | Wt 213.8 lb

## 2014-03-17 DIAGNOSIS — Z3480 Encounter for supervision of other normal pregnancy, unspecified trimester: Secondary | ICD-10-CM

## 2014-03-17 DIAGNOSIS — Z23 Encounter for immunization: Secondary | ICD-10-CM | POA: Insufficient documentation

## 2014-03-17 DIAGNOSIS — O34219 Maternal care for unspecified type scar from previous cesarean delivery: Secondary | ICD-10-CM

## 2014-03-17 DIAGNOSIS — O3680X0 Pregnancy with inconclusive fetal viability, not applicable or unspecified: Secondary | ICD-10-CM

## 2014-03-17 DIAGNOSIS — Z3490 Encounter for supervision of normal pregnancy, unspecified, unspecified trimester: Secondary | ICD-10-CM | POA: Insufficient documentation

## 2014-03-17 DIAGNOSIS — O3421 Maternal care for scar from previous cesarean delivery: Secondary | ICD-10-CM

## 2014-03-17 DIAGNOSIS — N898 Other specified noninflammatory disorders of vagina: Secondary | ICD-10-CM

## 2014-03-17 LAB — POCT URINALYSIS DIP (DEVICE)
BILIRUBIN URINE: NEGATIVE
GLUCOSE, UA: NEGATIVE mg/dL
Hgb urine dipstick: NEGATIVE
KETONES UR: NEGATIVE mg/dL
LEUKOCYTES UA: NEGATIVE
NITRITE: NEGATIVE
PH: 6 (ref 5.0–8.0)
Protein, ur: NEGATIVE mg/dL
Specific Gravity, Urine: >=1.03 (ref 1.005–1.030)
Urobilinogen, UA: 0.2 mg/dL (ref 0.0–1.0)

## 2014-03-17 LAB — US OB COMP + 14 WK

## 2014-03-17 NOTE — Progress Notes (Signed)
Bedside US for confirmation of viability = FHR 138 bpm, fetal movement of body and limbs present.  Sharen Counter, CNM notified

## 2014-03-17 NOTE — Progress Notes (Signed)
Initial prenatal visit, desires HSV testing.  Unsure of dates.

## 2014-03-17 NOTE — Progress Notes (Signed)
   Subjective:    Stacy Moss is a G2P1001 [redacted]w[redacted]d being seen today for her first obstetrical visit.  Her obstetrical history is significant for LTCS x1 at term. Patient does intend to breast feed. Pregnancy history fully reviewed.  Patient reports nausea.  Filed Vitals:   03/17/14 1000  BP: 115/79  Pulse: 96  Temp: 97.5 F (36.4 C)  Weight: 213 lb 12.8 oz (96.979 kg)    HISTORY: OB History  Gravida Para Term Preterm AB SAB TAB Ectopic Multiple Living  2 1 1       1     # Outcome Date GA Lbr Len/2nd Weight Sex Delivery Anes PTL Lv  2 CUR           1 TRM 05/25/12 [redacted]w[redacted]d  5 lb 11.2 oz (2.585 kg) F LTCS EPI  Y     Past Medical History  Diagnosis Date  . Hx of tonsillectomy   . S/P cesarean section 05/25/2012  . Seizures     age [age]- after fell and hit head  . Hypertension     on meds 2011  . Anxiety   . Mental disorder   . Depression   . Bipolar affective   . ADD (attention deficit disorder)    Past Surgical History  Procedure Laterality Date  . Tonsillectomy    . Cesarean section  05/25/2012    Procedure: CESAREAN SECTION;  Surgeon: Sherron Monday, MD;  Location: [facility];  Service: Gynecology;  Laterality: N/A;  Primary cesarean section with delivery of baby girl at 0700. Apgars9/9.   Family History  Problem Relation Age of Onset  . Anesthesia problems Neg Hx   . Other Neg Hx   . Cancer Maternal Grandmother      Exam    Uterus:   Fundal height 17 cm  Pelvic Exam: Deferred  System: Breast:  normal appearance, no masses or tenderness   Skin: normal coloration and turgor, no rashes    Neurologic: oriented, normal, normal mood   Extremities: normal strength, tone, and muscle mass, ROM of all joints is normal   HEENT neck supple with midline trachea and thyroid without masses   Mouth/Teeth mucous membranes moist, pharynx normal without lesions and dental hygiene good   Neck supple and no masses   Cardiovascular: regular rate and rhythm   Respiratory:  appears  well, vitals normal, no respiratory distress, acyanotic, normal RR, ear and throat exam is normal, neck free of mass or lymphadenopathy, chest clear, no wheezing, crepitations, rhonchi, normal symmetric air entry   Abdomen: soft, non-tender; bowel sounds normal; no masses,  no organomegaly   Urinary: not evaluated      Assessment:    Pregnancy: G2P1001 There are no active problems to display for this patient.       Plan:     Initial labs drawn. Prenatal vitamins. Problem list reviewed and updated. Genetic Screening discussed Quad Screen: requested.  Ultrasound discussed; fetal survey: ordered.  Follow up in 4 weeks. 50% of 30 min visit spent on counseling and coordination of care.     Wilmer Floor Leftwich-Kirby 03/17/2014

## 2014-03-18 ENCOUNTER — Telehealth: Payer: Self-pay | Admitting: *Deleted

## 2014-03-18 LAB — OBSTETRIC PANEL
Antibody Screen: NEGATIVE
BASOS PCT: 0 % (ref 0–1)
Basophils Absolute: 0 10*3/uL (ref 0.0–0.1)
Eosinophils Absolute: 0 10*3/uL (ref 0.0–0.7)
Eosinophils Relative: 0 % (ref 0–5)
HEMATOCRIT: 31.9 % — AB (ref 36.0–46.0)
HEMOGLOBIN: 10.9 g/dL — AB (ref 12.0–15.0)
HEP B S AG: NEGATIVE
LYMPHS PCT: 22 % (ref 12–46)
Lymphs Abs: 2.3 10*3/uL (ref 0.7–4.0)
MCH: 29.7 pg (ref 26.0–34.0)
MCHC: 34.2 g/dL (ref 30.0–36.0)
MCV: 86.9 fL (ref 78.0–100.0)
MONO ABS: 0.6 10*3/uL (ref 0.1–1.0)
MONOS PCT: 6 % (ref 3–12)
NEUTROS ABS: 7.4 10*3/uL (ref 1.7–7.7)
NEUTROS PCT: 72 % (ref 43–77)
Platelets: 274 10*3/uL (ref 150–400)
RBC: 3.67 MIL/uL — AB (ref 3.87–5.11)
RDW: 13.2 % (ref 11.5–15.5)
RH TYPE: POSITIVE
RUBELLA: 8.69 {index} — AB (ref <0.90)
WBC: 10.3 10*3/uL (ref 4.0–10.5)

## 2014-03-18 LAB — WET PREP, GENITAL
Trich, Wet Prep: NONE SEEN
YEAST WET PREP: NONE SEEN

## 2014-03-18 LAB — GLUCOSE TOLERANCE, 1 HOUR (50G) W/O FASTING: Glucose, 1 Hour GTT: 62 mg/dL — ABNORMAL LOW (ref 70–140)

## 2014-03-18 LAB — HIV ANTIBODY (ROUTINE TESTING W REFLEX): HIV: NONREACTIVE

## 2014-03-18 NOTE — Telephone Encounter (Signed)
Pt called nurse line requesting results.  Spoke with patient concerning results from prenatal lab work done yesterday. Pt states if she has a bacterial infection in vagina she would prefer something to insert into vagina instead of taking pills.

## 2014-03-19 LAB — HEMOGLOBINOPATHY EVALUATION
HEMOGLOBIN OTHER: 0 %
HGB A: 97.1 % (ref 96.8–97.8)
HGB F QUANT: 0 % (ref 0.0–2.0)
HGB S QUANTITAION: 0 %
Hgb A2 Quant: 2.9 % (ref 2.2–3.2)

## 2014-03-19 LAB — GC/CHLAMYDIA PROBE AMP
CT Probe RNA: NEGATIVE
GC PROBE AMP APTIMA: NEGATIVE

## 2014-03-20 LAB — CULTURE, OB URINE: Colony Count: 30000

## 2014-03-20 LAB — CANNABANOIDS (GC/LC/MS), URINE: THC-COOH (GC/LC/MS), ur confirm: 373 ng/mL — AB (ref <5)

## 2014-03-23 DIAGNOSIS — O34219 Maternal care for unspecified type scar from previous cesarean delivery: Secondary | ICD-10-CM | POA: Insufficient documentation

## 2014-03-23 LAB — PRESCRIPTION MONITORING PROFILE (19 PANEL)
Amphetamine/Meth: NEGATIVE ng/mL
BUPRENORPHINE, URINE: NEGATIVE ng/mL
Barbiturate Screen, Urine: NEGATIVE ng/mL
Benzodiazepine Screen, Urine: NEGATIVE ng/mL
COCAINE METABOLITES: NEGATIVE ng/mL
Carisoprodol, Urine: NEGATIVE ng/mL
Creatinine, Urine: 195.28 mg/dL (ref >20.0)
ECSTASY: NEGATIVE ng/mL
FENTANYL URINE: NEGATIVE ng/mL
MEPERIDINE UR: NEGATIVE ng/mL
Methadone Screen, Urine: NEGATIVE ng/mL
Methaqualone: NEGATIVE ng/mL
Nitrites, Initial: NEGATIVE ug/mL
Opiate Screen, Urine: NEGATIVE ng/mL
Oxycodone Screen, Ur: NEGATIVE ng/mL
PHENCYCLIDINE, UR: NEGATIVE ng/mL
Propoxyphene: NEGATIVE ng/mL
Tapentadol, urine: NEGATIVE ng/mL
Tramadol Scrn, Ur: NEGATIVE ng/mL
ZOLPIDEM, URINE: NEGATIVE ng/mL
pH, Initial: 6.3 pH (ref 4.5–8.9)

## 2014-04-02 ENCOUNTER — Ambulatory Visit (HOSPITAL_COMMUNITY)
Admission: RE | Admit: 2014-04-02 | Discharge: 2014-04-02 | Disposition: A | Discharge status: Home / Self Care | Payer: Medicaid Other | Source: Ambulatory Visit | Attending: Advanced Practice Midwife | Admitting: Advanced Practice Midwife

## 2014-04-02 DIAGNOSIS — Z3689 Encounter for other specified antenatal screening: Secondary | ICD-10-CM | POA: Insufficient documentation

## 2014-04-02 DIAGNOSIS — Z3490 Encounter for supervision of normal pregnancy, unspecified, unspecified trimester: Secondary | ICD-10-CM

## 2014-04-05 ENCOUNTER — Encounter: Payer: Self-pay | Admitting: Advanced Practice Midwife

## 2014-04-14 ENCOUNTER — Encounter: Payer: Medicaid Other | Admitting: Advanced Practice Midwife

## 2014-06-17 LAB — OB RESULTS CONSOLE VARICELLA ZOSTER ANTIBODY, IGG: Varicella: IMMUNE

## 2014-06-17 LAB — GLUCOSE TOLERANCE, 1 HOUR (50G) W/O FASTING: Glucose, 1 Hour GTT: 88

## 2014-06-17 LAB — OB RESULTS CONSOLE HIV ANTIBODY (ROUTINE TESTING): HIV: NONREACTIVE

## 2014-06-17 LAB — OB RESULTS CONSOLE RPR: RPR: NONREACTIVE

## 2014-07-09 LAB — US OB COMP + 14 WK

## 2014-08-12 ENCOUNTER — Other Ambulatory Visit: Payer: Self-pay | Admitting: Obstetrics & Gynecology

## 2014-08-12 ENCOUNTER — Ambulatory Visit (INDEPENDENT_AMBULATORY_CARE_PROVIDER_SITE_OTHER): Payer: Medicaid Other | Admitting: Obstetrics & Gynecology

## 2014-08-12 VITALS — BP 130/54 | HR 86 | Wt 232.9 lb

## 2014-08-12 DIAGNOSIS — O0930 Supervision of pregnancy with insufficient antenatal care, unspecified trimester: Secondary | ICD-10-CM | POA: Insufficient documentation

## 2014-08-12 DIAGNOSIS — O34219 Maternal care for unspecified type scar from previous cesarean delivery: Secondary | ICD-10-CM

## 2014-08-12 DIAGNOSIS — O3421 Maternal care for scar from previous cesarean delivery: Secondary | ICD-10-CM

## 2014-08-12 DIAGNOSIS — Z3493 Encounter for supervision of normal pregnancy, unspecified, third trimester: Secondary | ICD-10-CM

## 2014-08-12 LAB — CBC
HCT: 32.2 % — ABNORMAL LOW (ref 36.0–46.0)
Hemoglobin: 10.8 g/dL — ABNORMAL LOW (ref 12.0–15.0)
MCH: 30.2 pg (ref 26.0–34.0)
MCHC: 33.5 g/dL (ref 30.0–36.0)
MCV: 89.9 fL (ref 78.0–100.0)
Platelets: 228 10*3/uL (ref 150–400)
RBC: 3.58 MIL/uL — ABNORMAL LOW (ref 3.87–5.11)
RDW: 13.2 % (ref 11.5–15.5)
WBC: 14.3 10*3/uL — ABNORMAL HIGH (ref 4.0–10.5)

## 2014-08-12 LAB — OB RESULTS CONSOLE GC/CHLAMYDIA
Chlamydia: NEGATIVE
Gonorrhea: NEGATIVE

## 2014-08-12 LAB — POCT URINALYSIS DIP (DEVICE)
Bilirubin Urine: NEGATIVE
Glucose, UA: NEGATIVE mg/dL
Hgb urine dipstick: NEGATIVE
Ketones, ur: NEGATIVE mg/dL
Nitrite: NEGATIVE
Protein, ur: NEGATIVE mg/dL
Specific Gravity, Urine: 1.025 (ref 1.005–1.030)
Urobilinogen, UA: 1 mg/dL (ref 0.0–1.0)
pH: 7 (ref 5.0–8.0)

## 2014-08-12 LAB — OB RESULTS CONSOLE GBS: STREP GROUP B AG: POSITIVE

## 2014-08-12 NOTE — Progress Notes (Signed)
PNC has been disjointed, social issue. Need record of visits in [place]. Signed VBAC consent today after counseling risk and benefit, including rupture of uterus

## 2014-08-12 NOTE — Progress Notes (Signed)
Patient states that [facility] [place] for a few visits, most recent about a month ago. Had glucose test, normal as far as patient knows. She will sign release to get records sent to us.  Patient would like to be tested for BV and GC./Ch. Patient reports that she was homeless, but fob is now out of prison and she has housing in [place] again.

## 2014-08-12 NOTE — Patient Instructions (Signed)
Vaginal Birth After Cesarean Delivery Vaginal birth after cesarean delivery (VBAC) is giving birth vaginally after previously delivering a baby by a cesarean. In the past, if a woman had a cesarean delivery, all births afterward would be done by cesarean delivery. This is no longer true. It can be safe for the mother to try a vaginal delivery after having a cesarean delivery.  It is important to discuss VBAC with your health care provider early in the pregnancy so you can understand the risks, benefits, and options. It will give you time to decide what is best in your particular case. The final decision about whether to have a VBAC or repeat cesarean delivery should be between you and your health care provider. Any changes in your health or your baby's health during your pregnancy may make it necessary to change your initial decision about VBAC.  WOMEN WHO PLAN TO HAVE A VBAC SHOULD CHECK WITH THEIR HEALTH CARE PROVIDER TO BE SURE THAT:  The previous cesarean delivery was done with a low transverse uterine cut (incision) (not a vertical classical incision).   The birth canal is big enough for the baby.   There were no other operations on the uterus.   An electronic fetal monitor (EFM) will be on at all times during labor.   An operating room will be available and ready in case an emergency cesarean delivery is needed.   A health care provider and surgical nursing staff will be available at all times during labor to be ready to do an emergency delivery cesarean if necessary.   An anesthesiologist will be present in case an emergency cesarean delivery is needed.   The nursery is prepared and has adequate personnel and necessary equipment available to care for the baby in case of an emergency cesarean delivery. BENEFITS OF VBAC  Shorter stay in the hospital.   Avoidance of risks associated with cesarean delivery, such as:  Surgical complications, such as opening of the incision or  hernia in the incision.  Injury to other organs.  Fever. This can occur if an infection develops after surgery. It can also occur as a reaction to the medicine given to make you numb during the surgery.  Less blood loss and need for blood transfusions.  Lower risk of blood clots and infection.  Shorter recovery.   Decreased risk for having to remove the uterus (hysterectomy).   Decreased risk for the placenta to completely or partially cover the opening of the uterus (placenta previa) with a future pregnancy.   Decrease risk in future labor and delivery. RISKS OF A VBAC  Tearing (rupture) of the uterus. This is occurs in less than 1% of VBACs. The risk of this happening is higher if:  Steps are taken to begin the labor process (induce labor) or stimulate or strengthen contractions (augment labor).   Medicine is used to soften (ripen) the cervix.  Having to remove the uterus (hysterectomy) if it ruptures. VBAC SHOULD NOT BE DONE IF:  The previous cesarean delivery was done with a vertical (classical) or T-shaped incision or you do not know what kind of incision was made.   You had a ruptured uterus.   You have had certain types of surgery on your uterus, such as removal of uterine fibroids. Ask your health care provider about other types of surgeries that prevent you from having a VBAC.  You have certain medical or childbirth (obstetrical) problems.   There are problems with the baby.   You   have had two previous cesarean deliveries and no vaginal deliveries. OTHER FACTS TO KNOW ABOUT VBAC:  It is safe to have an epidural anesthetic with VBAC.   It is safe to turn the baby from a breech position (attempt an external cephalic version).   It is safe to try a VBAC with twins.   VBAC may not be successful if your baby weights 8.8 lb (4 kg) or more. However, weight predictions are not always accurate and should not be used alone to decide if VBAC is right for  you.  There is an increased failure rate if the time between the cesarean delivery and VBAC is less than 19 months.   Your health care provider may advise against a VBAC if you have preeclampsia (high blood pressure, protein in the urine, and swelling of face and extremities).   VBAC is often successful if you previously gave birth vaginally.   VBAC is often successful when the labor starts spontaneously before the due date.   Delivering a baby through a VBAC is similar to having a normal spontaneous vaginal delivery. Document Released: 03/31/2007 Document Revised: 02/22/2014 Document Reviewed: 05/07/2013 ExitCare Patient Information 2015 [employer]. This information is not intended to replace advice given to you by your health care provider. Make sure you discuss any questions you have with your health care provider.  

## 2014-08-13 ENCOUNTER — Telehealth: Payer: Self-pay | Admitting: *Deleted

## 2014-08-13 DIAGNOSIS — B373 Candidiasis of vulva and vagina: Secondary | ICD-10-CM

## 2014-08-13 DIAGNOSIS — B3731 Acute candidiasis of vulva and vagina: Secondary | ICD-10-CM

## 2014-08-13 LAB — HIV ANTIBODY (ROUTINE TESTING W REFLEX): HIV 1&2 Ab, 4th Generation: NONREACTIVE

## 2014-08-13 LAB — WET PREP, GENITAL
CLUE CELLS WET PREP: NONE SEEN
TRICH WET PREP: NONE SEEN
WBC, Wet Prep HPF POC: NONE SEEN

## 2014-08-13 LAB — GC/CHLAMYDIA PROBE AMP
CT Probe RNA: NEGATIVE
GC Probe RNA: NEGATIVE

## 2014-08-13 LAB — CULTURE, BETA STREP (GROUP B ONLY)

## 2014-08-13 LAB — RPR

## 2014-08-13 NOTE — Telephone Encounter (Signed)
Message copied by Gerome ApleyZEYFANG, Eber Ferrufino L on Fri Aug 13, 2014 11:49 AM ------      Message from: Adam PhenixARNOLD, JAMES G      Created: Fri Aug 13, 2014 10:53 AM       Yeast vaginitis needs diflucan 150 mg po single dose ------

## 2014-08-16 MED ORDER — FLUCONAZOLE 150 MG PO TABS: 150.0000 mg | ORAL_TABLET | Freq: Once | ORAL | Status: DC

## 2014-08-16 NOTE — Telephone Encounter (Signed)
Diflucan 150mg  e-prescribed per protocol. Attempted to contact patient. No answer. Left messages at both numbers stating we are calling with results, please call clinic.

## 2014-08-17 NOTE — Telephone Encounter (Signed)
Patient called back leaving message that she is returning our call. Called patient and informed her of yeast infection and medication at pharmacy. Patient verbalized understanding and had no other questions

## 2014-08-17 NOTE — Telephone Encounter (Signed)
Attempted to contact patient. No answer. Left message stating we are calling with results, please call clinic.  

## 2014-08-19 ENCOUNTER — Encounter: Payer: Self-pay | Admitting: *Deleted

## 2014-08-19 ENCOUNTER — Ambulatory Visit (INDEPENDENT_AMBULATORY_CARE_PROVIDER_SITE_OTHER): Payer: Medicaid Other | Admitting: Obstetrics & Gynecology

## 2014-08-19 VITALS — BP 132/69 | HR 92 | Temp 98.2°F | Wt 232.4 lb

## 2014-08-19 DIAGNOSIS — O0930 Supervision of pregnancy with insufficient antenatal care, unspecified trimester: Secondary | ICD-10-CM | POA: Diagnosis not present

## 2014-08-19 DIAGNOSIS — O359XX1 Maternal care for (suspected) fetal abnormality and damage, unspecified, fetus 1: Secondary | ICD-10-CM | POA: Diagnosis not present

## 2014-08-19 DIAGNOSIS — O359XX0 Maternal care for (suspected) fetal abnormality and damage, unspecified, not applicable or unspecified: Secondary | ICD-10-CM | POA: Insufficient documentation

## 2014-08-19 DIAGNOSIS — Z3483 Encounter for supervision of other normal pregnancy, third trimester: Secondary | ICD-10-CM

## 2014-08-19 DIAGNOSIS — Z3493 Encounter for supervision of normal pregnancy, unspecified, third trimester: Secondary | ICD-10-CM

## 2014-08-19 DIAGNOSIS — Z23 Encounter for immunization: Secondary | ICD-10-CM

## 2014-08-19 LAB — POCT URINALYSIS DIP (DEVICE)
Bilirubin Urine: NEGATIVE
Glucose, UA: NEGATIVE mg/dL
Hgb urine dipstick: NEGATIVE
Ketones, ur: NEGATIVE mg/dL
NITRITE: NEGATIVE
PROTEIN: NEGATIVE mg/dL
UROBILINOGEN UA: 0.2 mg/dL (ref 0.0–1.0)
pH: 5.5 (ref 5.0–8.0)

## 2014-08-19 MED ORDER — TETANUS-DIPHTH-ACELL PERTUSSIS 5-2.5-18.5 LF-MCG/0.5 IM SUSP
0.5000 mL | Freq: Once | INTRAMUSCULAR | Status: DC
Start: 2014-08-19 — End: 2014-08-29

## 2014-08-19 NOTE — Progress Notes (Signed)
Pt concerned about baby kidneys.  [facility] that we have been unable to access.  Pt told about cyst on kidneys.  Will send to [facility] for f/u US as they were most likely the ones who performed the US in question.  Discussed VBAC and seemed to be unaware of risk of brain damage or fetal death in most extreme cases.  Tdap and Flu today.

## 2014-08-19 NOTE — Progress Notes (Signed)
[facility] and spoke with Allena EaringLouanne [phone] who stated patient [facility] (clinic with same [facility]FM providers)-- stated she would fax U/S to clinic now. Follow up U/S scheduled in [facility] for 08/24/14 at 1600. Patient advised to arrive 15 minutes early.

## 2014-08-23 ENCOUNTER — Ambulatory Visit (HOSPITAL_COMMUNITY): Payer: Medicaid Other

## 2014-08-23 ENCOUNTER — Encounter: Payer: Self-pay | Admitting: Advanced Practice Midwife

## 2014-08-24 ENCOUNTER — Ambulatory Visit (HOSPITAL_COMMUNITY): Payer: Medicaid Other

## 2014-08-25 ENCOUNTER — Ambulatory Visit (HOSPITAL_COMMUNITY)
Admission: RE | Admit: 2014-08-25 | Discharge: 2014-08-25 | Disposition: A | Discharge status: Home / Self Care | Payer: Medicaid Other | Source: Ambulatory Visit | Attending: Obstetrics & Gynecology | Admitting: Obstetrics & Gynecology

## 2014-08-25 ENCOUNTER — Encounter (HOSPITAL_COMMUNITY): Payer: Self-pay

## 2014-08-25 DIAGNOSIS — O3421 Maternal care for scar from previous cesarean delivery: Secondary | ICD-10-CM | POA: Insufficient documentation

## 2014-08-25 DIAGNOSIS — Z3A38 38 weeks gestation of pregnancy: Secondary | ICD-10-CM | POA: Insufficient documentation

## 2014-08-25 DIAGNOSIS — O358XX0 Maternal care for other (suspected) fetal abnormality and damage, not applicable or unspecified: Secondary | ICD-10-CM | POA: Insufficient documentation

## 2014-08-25 DIAGNOSIS — O0933 Supervision of pregnancy with insufficient antenatal care, third trimester: Secondary | ICD-10-CM | POA: Diagnosis not present

## 2014-08-25 DIAGNOSIS — O10013 Pre-existing essential hypertension complicating pregnancy, third trimester: Secondary | ICD-10-CM | POA: Diagnosis not present

## 2014-08-25 DIAGNOSIS — Z3483 Encounter for supervision of other normal pregnancy, third trimester: Secondary | ICD-10-CM

## 2014-08-25 DIAGNOSIS — O35EXX0 Maternal care for other (suspected) fetal abnormality and damage, fetal genitourinary anomalies, not applicable or unspecified: Secondary | ICD-10-CM | POA: Insufficient documentation

## 2014-08-26 ENCOUNTER — Encounter: Payer: Medicaid Other | Admitting: Family Medicine

## 2014-08-29 ENCOUNTER — Inpatient Hospital Stay (HOSPITAL_COMMUNITY)
Admission: AD | Admit: 2014-08-29 | Discharge: 2014-08-29 | Disposition: A | Discharge status: Home / Self Care | Payer: Medicaid Other | Source: Ambulatory Visit | Attending: Obstetrics and Gynecology | Admitting: Obstetrics and Gynecology

## 2014-08-29 ENCOUNTER — Encounter (HOSPITAL_COMMUNITY): Payer: Self-pay

## 2014-08-29 DIAGNOSIS — O359XX1 Maternal care for (suspected) fetal abnormality and damage, unspecified, fetus 1: Secondary | ICD-10-CM

## 2014-08-29 DIAGNOSIS — O26893 Other specified pregnancy related conditions, third trimester: Secondary | ICD-10-CM | POA: Insufficient documentation

## 2014-08-29 DIAGNOSIS — O479 False labor, unspecified: Secondary | ICD-10-CM

## 2014-08-29 DIAGNOSIS — Z3A38 38 weeks gestation of pregnancy: Secondary | ICD-10-CM | POA: Insufficient documentation

## 2014-08-29 DIAGNOSIS — R030 Elevated blood-pressure reading, without diagnosis of hypertension: Secondary | ICD-10-CM

## 2014-08-29 DIAGNOSIS — R51 Headache: Secondary | ICD-10-CM | POA: Diagnosis not present

## 2014-08-29 DIAGNOSIS — IMO0001 Reserved for inherently not codable concepts without codable children: Secondary | ICD-10-CM

## 2014-08-29 DIAGNOSIS — O34219 Maternal care for unspecified type scar from previous cesarean delivery: Secondary | ICD-10-CM

## 2014-08-29 LAB — URINALYSIS, ROUTINE W REFLEX MICROSCOPIC
Bilirubin Urine: NEGATIVE
GLUCOSE, UA: NEGATIVE mg/dL
Hgb urine dipstick: NEGATIVE
KETONES UR: NEGATIVE mg/dL
Leukocytes, UA: NEGATIVE
Nitrite: NEGATIVE
PROTEIN: NEGATIVE mg/dL
Specific Gravity, Urine: 1.025 (ref 1.005–1.030)
Urobilinogen, UA: 0.2 mg/dL (ref 0.0–1.0)
pH: 6.5 (ref 5.0–8.0)

## 2014-08-29 LAB — COMPREHENSIVE METABOLIC PANEL
ALT: 7 U/L (ref 0–35)
AST: 14 U/L (ref 0–37)
Albumin: 2.7 g/dL — ABNORMAL LOW (ref 3.5–5.2)
Alkaline Phosphatase: 129 U/L — ABNORMAL HIGH (ref 39–117)
Anion gap: 12 (ref 5–15)
BUN: 10 mg/dL (ref 6–23)
CALCIUM: 9 mg/dL (ref 8.4–10.5)
CO2: 22 mEq/L (ref 19–32)
Chloride: 104 mEq/L (ref 96–112)
Creatinine, Ser: 0.58 mg/dL (ref 0.50–1.10)
GFR calc Af Amer: >90 mL/min (ref >90)
GFR calc non Af Amer: >90 mL/min (ref >90)
Glucose, Bld: 107 mg/dL — ABNORMAL HIGH (ref 70–99)
Potassium: 4.3 mEq/L (ref 3.7–5.3)
SODIUM: 138 meq/L (ref 137–147)
TOTAL PROTEIN: 5.7 g/dL — AB (ref 6.0–8.3)
Total Bilirubin: <0.2 mg/dL — ABNORMAL LOW (ref 0.3–1.2)

## 2014-08-29 LAB — CBC
HCT: 30.8 % — ABNORMAL LOW (ref 36.0–46.0)
Hemoglobin: 10.4 g/dL — ABNORMAL LOW (ref 12.0–15.0)
MCH: 30.6 pg (ref 26.0–34.0)
MCHC: 33.8 g/dL (ref 30.0–36.0)
MCV: 90.6 fL (ref 78.0–100.0)
Platelets: 194 10*3/uL (ref 150–400)
RBC: 3.4 MIL/uL — ABNORMAL LOW (ref 3.87–5.11)
RDW: 12.7 % (ref 11.5–15.5)
WBC: 12.8 10*3/uL — ABNORMAL HIGH (ref 4.0–10.5)

## 2014-08-29 LAB — PROTEIN / CREATININE RATIO, URINE
Creatinine, Urine: 116.71 mg/dL
PROTEIN CREATININE RATIO: 0.07 (ref 0.00–0.15)
TOTAL PROTEIN, URINE: 7.9 mg/dL

## 2014-08-29 MED ORDER — ACETAMINOPHEN 325 MG PO TABS
650.0000 mg | ORAL_TABLET | Freq: Once | ORAL | Status: AC
  Administered 2014-08-29: 650 mg via ORAL
  Filled 2014-08-29: qty 2

## 2014-08-29 NOTE — MAU Note (Signed)
Came in by EMS.  Having contractions since 0230.  No bleeding, no leaking.  Baby moving well.  Was 1 cm in office.

## 2014-08-29 NOTE — MAU Provider Note (Signed)
Patient is 23 y.o. G2P1001 4423w6d here with complaints of ctx and headache. Pt states that ctx began earlier this evening and became intense. However, they have now mostly subsided. Also states that she has had a headache, which she rates as an 8/10. She has had multiple headaches throughout pregnancy, and this is not the worse. She states that she has had blurry vision and spots since apx. 1 month ago. Denies swelling. Has had limited prenatal care.  +FM, denies LOF, VB, contractions, vaginal discharge.  Physical exam Generael: Afebrile. No acute distress. Neuro: DTRs normal Ext: No edema NST: Reactive. Category I. No contractions.  Assessment/plan Patient is 23 y.o. G2P1001 2123w6d w term gestation.   #Term gestation at 2123w6d --Continue prenatal care as previously planned. --Elevated blood pressures while in MAU, however resolved --Return if headache worsens or new symptoms appear --Monitor pressure at subsequent visits  I spoke with and examined patient and agree with resident/PA/SNM's note and plan of care.  Dilation: 1.5 Effacement (%): 50 Cervical Position: Posterior Station: -3 Presentation: Vertex Exam by:: Benji StanleyRN  Denies ha, scotomata, ruq/epigastric pain, n/v.  BPs elevated during time pt was hurting w/ uc's, pre-e labs were normal, bp's normalized as uc's spaced out and became less intense. Pt states she is no longer having uc's, reports +fm. Has f/u in clinic on Thursday. Reviewed labor s/s, pre-e warning s/s, reasons to return. OK for d/c per Dr. Jolayne Pantheronstant since bp's normalized.  Stacy Moss, CNM, [facility]NP-BC 08/29/2014 8:55 AM

## 2014-08-29 NOTE — Discharge Instructions (Signed)
Call the clinic [phone]) or go to [facility] for these signs of pre-eclampsia:  Severe headache that does not go away with Tylenol  Visual changes- seeing spots, double, blurred vision  Pain under your right breast or upper abdomen that does not go away with Tums or heartburn medicine  Nausea and/or vomiting  Severe swelling in your hands, feet, and face   Call the clinic [phone]) or go to [facility] if:  You begin to have strong, frequent contractions  Your water breaks.  Sometimes it is a big gush of fluid, sometimes it is just a trickle that keeps getting your panties wet or running down your legs  You have vaginal bleeding.  It is normal to have a small amount of spotting if your cervix was checked.   You don't feel your baby moving like normal.  If you don't, get you something to eat and drink and lay down and focus on feeling your baby move.  You should feel at least 10 movements in 2 hours.  If you don't, you should call the office or [facility].    Fetal Movement Counts Patient Name: __________________________________________________ Patient Due Date: ____________________ Performing a fetal movement count is highly recommended in high-risk pregnancies, but it is good for every pregnant woman to do. Your health care provider may ask you to start counting fetal movements at 28 weeks of the pregnancy. Fetal movements often increase:  After eating a full meal.  After physical activity.  After eating or drinking something sweet or cold.  At rest. Pay attention to when you feel the baby is most active. This will help you notice a pattern of your baby's sleep and wake cycles and what factors contribute to an increase in fetal movement. It is important to perform a fetal movement count at the same time each day when your baby is normally most active.  HOW TO COUNT FETAL MOVEMENTS 1. Find a quiet and comfortable area to sit or lie down on your left side.  Lying on your left side provides the best blood and oxygen circulation to your baby. 2. Write down the day and time on a sheet of paper or in a journal. 3. Start counting kicks, flutters, swishes, rolls, or jabs in a 2-hour period. You should feel at least 10 movements within 2 hours. 4. If you do not feel 10 movements in 2 hours, wait 2-3 hours and count again. Look for a change in the pattern or not enough counts in 2 hours. SEEK MEDICAL CARE IF:  You feel less than 10 counts in 2 hours, tried twice.  There is no movement in over an hour.  The pattern is changing or taking longer each day to reach 10 counts in 2 hours.  You feel the baby is not moving as he or she usually does. Date: ____________ Movements: ____________ Start time: ____________ Doreatha MartinFinish time: ____________  Date: ____________ Movements: ____________ Start time: ____________ Doreatha MartinFinish time: ____________ Date: ____________ Movements: ____________ Start time: ____________ Doreatha MartinFinish time: ____________ Date: ____________ Movements: ____________ Start time: ____________ Doreatha MartinFinish time: ____________ Date: ____________ Movements: ____________ Start time: ____________ Doreatha MartinFinish time: ____________ Date: ____________ Movements: ____________ Start time: ____________ Doreatha MartinFinish time: ____________ Date: ____________ Movements: ____________ Start time: ____________ Doreatha MartinFinish time: ____________ Date: ____________ Movements: ____________ Start time: ____________ Doreatha MartinFinish time: ____________  Date: ____________ Movements: ____________ Start time: ____________ Doreatha MartinFinish time: ____________ Date: ____________ Movements: ____________ Start time: ____________ Doreatha MartinFinish time: ____________ Date: ____________ Movements: ____________ Start time: ____________ Doreatha MartinFinish  time: ____________ Date: ____________ Movements: ____________ Start time: ____________ Doreatha MartinFinish time: ____________ Date: ____________ Movements: ____________ Start time: ____________ Doreatha MartinFinish time: ____________ Date:  ____________ Movements: ____________ Start time: ____________ Doreatha MartinFinish time: ____________ Date: ____________ Movements: ____________ Start time: ____________ Doreatha MartinFinish time: ____________  Date: ____________ Movements: ____________ Start time: ____________ Doreatha MartinFinish time: ____________ Date: ____________ Movements: ____________ Start time: ____________ Doreatha MartinFinish time: ____________ Date: ____________ Movements: ____________ Start time: ____________ Doreatha MartinFinish time: ____________ Date: ____________ Movements: ____________ Start time: ____________ Doreatha MartinFinish time: ____________ Date: ____________ Movements: ____________ Start time: ____________ Doreatha MartinFinish time: ____________ Date: ____________ Movements: ____________ Start time: ____________ Doreatha MartinFinish time: ____________ Date: ____________ Movements: ____________ Start time: ____________ Doreatha MartinFinish time: ____________  Date: ____________ Movements: ____________ Start time: ____________ Doreatha MartinFinish time: ____________ Date: ____________ Movements: ____________ Start time: ____________ Doreatha MartinFinish time: ____________ Date: ____________ Movements: ____________ Start time: ____________ Doreatha MartinFinish time: ____________ Date: ____________ Movements: ____________ Start time: ____________ Doreatha MartinFinish time: ____________ Date: ____________ Movements: ____________ Start time: ____________ Doreatha MartinFinish time: ____________ Date: ____________ Movements: ____________ Start time: ____________ Doreatha MartinFinish time: ____________ Date: ____________ Movements: ____________ Start time: ____________ Doreatha MartinFinish time: ____________  Date: ____________ Movements: ____________ Start time: ____________ Doreatha MartinFinish time: ____________ Date: ____________ Movements: ____________ Start time: ____________ Doreatha MartinFinish time: ____________ Date: ____________ Movements: ____________ Start time: ____________ Doreatha MartinFinish time: ____________ Date: ____________ Movements: ____________ Start time: ____________ Doreatha MartinFinish time: ____________ Date: ____________ Movements: ____________ Start  time: ____________ Doreatha MartinFinish time: ____________ Date: ____________ Movements: ____________ Start time: ____________ Doreatha MartinFinish time: ____________ Date: ____________ Movements: ____________ Start time: ____________ Doreatha MartinFinish time: ____________  Date: ____________ Movements: ____________ Start time: ____________ Doreatha MartinFinish time: ____________ Date: ____________ Movements: ____________ Start time: ____________ Doreatha MartinFinish time: ____________ Date: ____________ Movements: ____________ Start time: ____________ Doreatha MartinFinish time: ____________ Date: ____________ Movements: ____________ Start time: ____________ Doreatha MartinFinish time: ____________ Date: ____________ Movements: ____________ Start time: ____________ Doreatha MartinFinish time: ____________ Date: ____________ Movements: ____________ Start time: ____________ Doreatha MartinFinish time: ____________ Date: ____________ Movements: ____________ Start time: ____________ Doreatha MartinFinish time: ____________  Date: ____________ Movements: ____________ Start time: ____________ Doreatha MartinFinish time: ____________ Date: ____________ Movements: ____________ Start time: ____________ Doreatha MartinFinish time: ____________ Date: ____________ Movements: ____________ Start time: ____________ Doreatha MartinFinish time: ____________ Date: ____________ Movements: ____________ Start time: ____________ Doreatha MartinFinish time: ____________ Date: ____________ Movements: ____________ Start time: ____________ Doreatha MartinFinish time: ____________ Date: ____________ Movements: ____________ Start time: ____________ Doreatha MartinFinish time: ____________ Date: ____________ Movements: ____________ Start time: ____________ Doreatha MartinFinish time: ____________  Date: ____________ Movements: ____________ Start time: ____________ Doreatha MartinFinish time: ____________ Date: ____________ Movements: ____________ Start time: ____________ Doreatha MartinFinish time: ____________ Date: ____________ Movements: ____________ Start time: ____________ Doreatha MartinFinish time: ____________ Date: ____________ Movements: ____________ Start time: ____________ Doreatha MartinFinish time:  ____________ Date: ____________ Movements: ____________ Start time: ____________ Doreatha MartinFinish time: ____________ Date: ____________ Movements: ____________ Start time: ____________ Doreatha MartinFinish time: ____________ Document Released: 11/07/2006 Document Revised: 02/22/2014 Document Reviewed: 08/04/2012 Exit[employer] Patient Information 2015 [employer]. This information is not intended to replace advice given to you by your health care provider. Make sure you discuss any questions you have with your health care provider.

## 2014-09-02 ENCOUNTER — Encounter (HOSPITAL_COMMUNITY): Payer: Self-pay | Admitting: *Deleted

## 2014-09-02 ENCOUNTER — Inpatient Hospital Stay (HOSPITAL_COMMUNITY): Payer: Medicaid Other | Admitting: Anesthesiology

## 2014-09-02 ENCOUNTER — Encounter: Payer: Medicaid Other | Admitting: Obstetrics & Gynecology

## 2014-09-02 ENCOUNTER — Inpatient Hospital Stay (HOSPITAL_COMMUNITY)
Admission: AD | Admit: 2014-09-02 | Discharge: 2014-09-05 | DRG: 775 | Disposition: A | Discharge status: Home / Self Care | Payer: Medicaid Other | Source: Ambulatory Visit | Attending: Obstetrics & Gynecology | Admitting: Obstetrics & Gynecology

## 2014-09-02 ENCOUNTER — Encounter: Payer: Self-pay | Admitting: Obstetrics & Gynecology

## 2014-09-02 DIAGNOSIS — O471 False labor at or after 37 completed weeks of gestation: Secondary | ICD-10-CM | POA: Diagnosis present

## 2014-09-02 DIAGNOSIS — O0930 Supervision of pregnancy with insufficient antenatal care, unspecified trimester: Secondary | ICD-10-CM

## 2014-09-02 DIAGNOSIS — O99824 Streptococcus B carrier state complicating childbirth: Secondary | ICD-10-CM | POA: Diagnosis present

## 2014-09-02 DIAGNOSIS — O99334 Smoking (tobacco) complicating childbirth: Secondary | ICD-10-CM | POA: Diagnosis present

## 2014-09-02 DIAGNOSIS — F17210 Nicotine dependence, cigarettes, uncomplicated: Secondary | ICD-10-CM | POA: Diagnosis present

## 2014-09-02 DIAGNOSIS — O358XX0 Maternal care for other (suspected) fetal abnormality and damage, not applicable or unspecified: Secondary | ICD-10-CM | POA: Diagnosis present

## 2014-09-02 DIAGNOSIS — Z3A39 39 weeks gestation of pregnancy: Secondary | ICD-10-CM | POA: Diagnosis present

## 2014-09-02 DIAGNOSIS — Q614 Renal dysplasia: Secondary | ICD-10-CM

## 2014-09-02 DIAGNOSIS — O3421 Maternal care for scar from previous cesarean delivery: Secondary | ICD-10-CM | POA: Diagnosis present

## 2014-09-02 DIAGNOSIS — Z8759 Personal history of other complications of pregnancy, childbirth and the puerperium: Secondary | ICD-10-CM | POA: Diagnosis present

## 2014-09-02 DIAGNOSIS — O133 Gestational [pregnancy-induced] hypertension without significant proteinuria, third trimester: Secondary | ICD-10-CM | POA: Diagnosis present

## 2014-09-02 LAB — HIV ANTIBODY (ROUTINE TESTING W REFLEX): HIV 1&2 Ab, 4th Generation: NONREACTIVE

## 2014-09-02 LAB — TYPE AND SCREEN
ABO/RH(D): AB POS
Antibody Screen: NEGATIVE

## 2014-09-02 LAB — PROTEIN / CREATININE RATIO, URINE
CREATININE, URINE: 231.91 mg/dL
Protein Creatinine Ratio: 0.08 (ref 0.00–0.15)
Total Protein, Urine: 19.5 mg/dL

## 2014-09-02 LAB — RAPID URINE DRUG SCREEN, HOSP PERFORMED
Amphetamines: NOT DETECTED
BARBITURATES: NOT DETECTED
BENZODIAZEPINES: NOT DETECTED
Cocaine: NOT DETECTED
Opiates: NOT DETECTED
Tetrahydrocannabinol: POSITIVE — AB

## 2014-09-02 LAB — COMPREHENSIVE METABOLIC PANEL
ALT: 9 U/L (ref 0–35)
AST: 12 U/L (ref 0–37)
Albumin: 3 g/dL — ABNORMAL LOW (ref 3.5–5.2)
Alkaline Phosphatase: 137 U/L — ABNORMAL HIGH (ref 39–117)
Anion gap: 13 (ref 5–15)
BUN: 9 mg/dL (ref 6–23)
CALCIUM: 9.4 mg/dL (ref 8.4–10.5)
CHLORIDE: 104 meq/L (ref 96–112)
CO2: 22 meq/L (ref 19–32)
Creatinine, Ser: 0.61 mg/dL (ref 0.50–1.10)
GFR calc Af Amer: >90 mL/min (ref >90)
GLUCOSE: 85 mg/dL (ref 70–99)
Potassium: 4.1 mEq/L (ref 3.7–5.3)
SODIUM: 139 meq/L (ref 137–147)
Total Bilirubin: 0.4 mg/dL (ref 0.3–1.2)
Total Protein: 6.2 g/dL (ref 6.0–8.3)

## 2014-09-02 LAB — URINALYSIS, ROUTINE W REFLEX MICROSCOPIC
Bilirubin Urine: NEGATIVE
Glucose, UA: NEGATIVE mg/dL
Hgb urine dipstick: NEGATIVE
KETONES UR: NEGATIVE mg/dL
NITRITE: NEGATIVE
PH: 6 (ref 5.0–8.0)
Protein, ur: NEGATIVE mg/dL
Specific Gravity, Urine: >1.03 — ABNORMAL HIGH (ref 1.005–1.030)
UROBILINOGEN UA: 1 mg/dL (ref 0.0–1.0)

## 2014-09-02 LAB — CBC
HCT: 34 % — ABNORMAL LOW (ref 36.0–46.0)
HEMOGLOBIN: 11.4 g/dL — AB (ref 12.0–15.0)
MCH: 30.4 pg (ref 26.0–34.0)
MCHC: 33.5 g/dL (ref 30.0–36.0)
MCV: 90.7 fL (ref 78.0–100.0)
PLATELETS: 201 10*3/uL (ref 150–400)
RBC: 3.75 MIL/uL — AB (ref 3.87–5.11)
RDW: 12.7 % (ref 11.5–15.5)
WBC: 12.7 10*3/uL — AB (ref 4.0–10.5)

## 2014-09-02 LAB — URINE MICROSCOPIC-ADD ON

## 2014-09-02 LAB — ABO/RH: ABO/RH(D): AB POS

## 2014-09-02 LAB — RPR

## 2014-09-02 MED ORDER — DIPHENHYDRAMINE HCL 50 MG/ML IJ SOLN: 12.5000 mg | INTRAMUSCULAR | Status: DC | PRN

## 2014-09-02 MED ORDER — OXYCODONE-ACETAMINOPHEN 5-325 MG PO TABS: 2.0000 | ORAL_TABLET | ORAL | Status: DC | PRN

## 2014-09-02 MED ORDER — OXYTOCIN BOLUS FROM INFUSION: 500.0000 mL | INTRAVENOUS | Status: DC

## 2014-09-02 MED ORDER — ZOLPIDEM TARTRATE 5 MG PO TABS: 5.0000 mg | ORAL_TABLET | Freq: Every evening | ORAL | Status: DC | PRN

## 2014-09-02 MED ORDER — OXYTOCIN 40 UNITS IN LACTATED RINGERS INFUSION - SIMPLE MED
62.5000 mL/h | INTRAVENOUS | Status: DC
  Administered 2014-09-03: 62.5 mL/h via INTRAVENOUS
  Filled 2014-09-02: qty 1000

## 2014-09-02 MED ORDER — ACETAMINOPHEN 325 MG PO TABS
650.0000 mg | ORAL_TABLET | ORAL | Status: DC | PRN
Start: 2014-09-02 — End: 2014-09-03
  Administered 2014-09-02 – 2014-09-03 (×3): 650 mg via ORAL
  Filled 2014-09-02 (×3): qty 2

## 2014-09-02 MED ORDER — PENICILLIN G POTASSIUM 5000000 UNITS IJ SOLR
2.5000 10*6.[IU] | INTRAVENOUS | Status: DC
  Administered 2014-09-02 – 2014-09-03 (×3): 2.5 10*6.[IU] via INTRAVENOUS
  Filled 2014-09-02 (×6): qty 2.5

## 2014-09-02 MED ORDER — FENTANYL CITRATE 0.05 MG/ML IJ SOLN: 100.0000 ug | INTRAMUSCULAR | Status: DC | PRN

## 2014-09-02 MED ORDER — LIDOCAINE HCL (PF) 1 % IJ SOLN
30.0000 mL | INTRAMUSCULAR | Status: DC | PRN
  Filled 2014-09-02: qty 30

## 2014-09-02 MED ORDER — PENICILLIN G POTASSIUM 5000000 UNITS IJ SOLR
5.0000 10*6.[IU] | Freq: Once | INTRAVENOUS | Status: AC
  Administered 2014-09-02: 5 10*6.[IU] via INTRAVENOUS
  Filled 2014-09-02: qty 5

## 2014-09-02 MED ORDER — FENTANYL 2.5 MCG/ML BUPIVACAINE 1/10 % EPIDURAL INFUSION (WH - ANES)
14.0000 mL/h | INTRAMUSCULAR | Status: DC | PRN
  Administered 2014-09-02 – 2014-09-03 (×2): 14 mL/h via EPIDURAL
  Filled 2014-09-02 (×2): qty 125

## 2014-09-02 MED ORDER — LACTATED RINGERS IV SOLN: 500.0000 mL | INTRAVENOUS | Status: DC | PRN

## 2014-09-02 MED ORDER — LACTATED RINGERS IV SOLN: 500.0000 mL | Freq: Once | INTRAVENOUS | Status: DC

## 2014-09-02 MED ORDER — OXYTOCIN 40 UNITS IN LACTATED RINGERS INFUSION - SIMPLE MED
1.0000 m[IU]/min | INTRAVENOUS | Status: DC
  Administered 2014-09-02: 2 m[IU]/min via INTRAVENOUS
  Administered 2014-09-02: 1 m[IU]/min via INTRAVENOUS

## 2014-09-02 MED ORDER — LACTATED RINGERS IV SOLN
INTRAVENOUS | Status: DC
  Administered 2014-09-02 (×2): via INTRAVENOUS

## 2014-09-02 MED ORDER — CITRIC ACID-SODIUM CITRATE 334-500 MG/5ML PO SOLN: 30.0000 mL | ORAL | Status: DC | PRN

## 2014-09-02 MED ORDER — ONDANSETRON HCL 4 MG/2ML IJ SOLN: 4.0000 mg | Freq: Four times a day (QID) | INTRAMUSCULAR | Status: DC | PRN

## 2014-09-02 MED ORDER — EPHEDRINE 5 MG/ML INJ
10.0000 mg | INTRAVENOUS | Status: DC | PRN
  Filled 2014-09-02: qty 2

## 2014-09-02 MED ORDER — TERBUTALINE SULFATE 1 MG/ML IJ SOLN: 0.2500 mg | Freq: Once | INTRAMUSCULAR | Status: AC | PRN

## 2014-09-02 MED ORDER — PHENYLEPHRINE 40 MCG/ML (10ML) SYRINGE FOR IV PUSH (FOR BLOOD PRESSURE SUPPORT)
80.0000 ug | PREFILLED_SYRINGE | INTRAVENOUS | Status: DC | PRN
  Filled 2014-09-02: qty 2

## 2014-09-02 MED ORDER — PHENYLEPHRINE 40 MCG/ML (10ML) SYRINGE FOR IV PUSH (FOR BLOOD PRESSURE SUPPORT)
80.0000 ug | PREFILLED_SYRINGE | INTRAVENOUS | Status: DC | PRN
  Filled 2014-09-02: qty 10
  Filled 2014-09-02: qty 2

## 2014-09-02 MED ORDER — OXYCODONE-ACETAMINOPHEN 5-325 MG PO TABS: 1.0000 | ORAL_TABLET | ORAL | Status: DC | PRN

## 2014-09-02 MED ORDER — LIDOCAINE HCL (PF) 1 % IJ SOLN
INTRAMUSCULAR | Status: DC | PRN
  Administered 2014-09-02 (×2): 5 mL

## 2014-09-02 NOTE — Anesthesia Preprocedure Evaluation (Signed)
Anesthesia Evaluation  Patient identified by MRN, date of birth, ID band Patient awake    Reviewed: Allergy & Precautions, H&P , Patient's Chart, lab work & pertinent test results  Airway Mallampati: II  TM Distance: >3 FB Neck ROM: full    Dental   Pulmonary Current Smoker,  breath sounds clear to auscultation        Cardiovascular hypertension, Rhythm:regular Rate:Normal     Neuro/Psych    GI/Hepatic   Endo/Other    Renal/GU      Musculoskeletal   Abdominal   Peds  Hematology   Anesthesia Other Findings   Reproductive/Obstetrics (+) Pregnancy                             Anesthesia Physical Anesthesia Plan  ASA: III  Anesthesia Plan: Epidural   Post-op Pain Management:    Induction:   Airway Management Planned:   Additional Equipment:   Intra-op Plan:   Post-operative Plan:   Informed Consent: I have reviewed the patients History and Physical, chart, labs and discussed the procedure including the risks, benefits and alternatives for the proposed anesthesia with the patient or authorized representative who has indicated his/her understanding and acceptance.     Plan Discussed with:   Anesthesia Plan Comments:         Anesthesia Quick Evaluation

## 2014-09-02 NOTE — Progress Notes (Signed)
   Ulyana Lucretia RoersM Byus is a 23 y.o. G3P1011 at 3236w3d  [facility]  Subjective: Comfortable with epidural  Objective: Filed Vitals:   09/02/14 2201 09/02/14 2231 09/02/14 2301 09/02/14 2331  BP: 146/79 151/85 140/66   Pulse: 72 73 77   Temp:      TempSrc:      Resp: 18 18 18 20   Height:      Weight:      SpO2:       Total I/O In: -  Out: 200 [Urine:200]  FHT:  FHR: 140 bpm, variability: moderate,  accelerations:  Present,  decelerations:  Present had a variable decel, now resolved UC:   rare SVE:   Dilation: 4.5 Effacement (%): 90 Station: -2 Exam by:: F. Cresenzo,CNM Pitocin @ 2 mu/min  Labs: Lab Results  Component Value Date   WBC 12.7* 09/02/2014   HGB 11.4* 09/02/2014   HCT 34.0* 09/02/2014   MCV 90.7 09/02/2014   PLT 201 09/02/2014    Assessment / Plan: early labor, stalled out.  Continue to increase pitocin prn labor  Labor: early Fetal Wellbeing:  Category I Pain Control:  Epidural Anticipated MOD:  NSVD  CRESENZO-DISHMAN,Cordaryl Decelles 09/02/2014, 11:34 PM

## 2014-09-02 NOTE — MAU Note (Signed)
Pt is here for U/C's for the last 24 hours.  Pt states they have gotten worse since about 0600 today and are now every 10-15 minutes apart.  Arrived by EMS.  Pt states she has an appointment at 1000 here in the clinic and missed her last appointment.  Denies vaginal bleeding or ROM.  Good fetal movement.

## 2014-09-02 NOTE — H&P (Signed)
HPI: Stacy Moss is a 23 y.o. year old 582P1001 female at 5364w3d weeks gestation who presents to MAU reporting Labor. Cervix 1 cm at last exam. Mildly elevated BP at MAU visit 08/29/14. Occasional mild HA's, Denies vision changes or epigastric pain.   Inconsistent PNC at multiple locations. Was living in shelter. Hx C/S in 2013 for repetitive, severe variables and FTP (4 cm) 5-11 lb baby. Desires TOLAC, but cannot find consent on chart. Will have new consent signed.   Cl[facility]  Dating  17.4 week US Blood type: AB/POS/-- (05/27 1158)  Genetic Screen ?Quad screen ordered.  Do not see results in Epic Antibody:NEG (05/27 1158)  Anatomic US 18 weeks NL; F/U 3rd trimester shows left multicystic dysplastic kidney and ?rt hydronephrosis.  Pt to be seen by peds urology after birth and NB MD needs to know. Rubella: 8.69 (05/27 1158)  GTT Early:         62      Third trimester: 88 RPR: NON REAC (05/27 1158)   TDaP vaccine 10/15 HBsAg: NEGATIVE (05/27 1158)   Flu vaccine 10/15 HIV: NONREACTIVE (05/27 1158)   GBS positive GBS:   Contraception  Pap:  Baby Food    Circumcision    Pediatrician    Support Person     Patient Active Problem List   Diagnosis Date Noted  . Gestational hypertension 09/02/2014  . [redacted] weeks gestation of pregnancy   . Pregnancy complicated by fetal genitourinary abnormality   . Known or suspected fetal anomaly affecting care of mother, antepartum 08/19/2014  . Limited prenatal care, antepartum 08/12/2014  . History of cesarean delivery, currently pregnant 03/23/2014  . Supervision of normal pregnancy 03/17/2014  . Flu vaccine need 03/17/2014   History OB History    Gravida Para Term Preterm AB TAB SAB Ectopic Multiple Living   3 1 1  1  1   1      Past Medical History  Diagnosis Date  . Hx of tonsillectomy   . S/P cesarean section 05/25/2012  . Seizures     age 89/13- after fell and hit head  . Hypertension     on meds 2011  . Anxiety   . Mental  disorder   . Depression   . Bipolar affective   . ADD (attention deficit disorder)    Past Surgical History  Procedure Laterality Date  . Tonsillectomy    . Cesarean section  05/25/2012    Procedure: CESAREAN SECTION;  Surgeon: Sherron MondayJody Bovard, MD;  Location: [facility];  Service: Gynecology;  Laterality: N/A;  Primary cesarean section with delivery of baby girl at 0700. Apgars9/9.   Family History: family history includes Cancer in her maternal grandmother. There is no history of Anesthesia problems or Other. Social History:  reports that she has been smoking Cigarettes.  She has a 2.5 pack-year smoking history. She has never used smokeless tobacco. She reports that she uses illicit drugs (Marijuana). She reports that she does not drink alcohol.   Prenatal Transfer Tool  Maternal Diabetes: No Genetic Screening: Declined Maternal Ultrasounds/Referrals: Abnormal:  Findings:   Fetal Kidney Anomalies Fetal Ultrasounds or other Referrals:  Referred to Materal Fetal Medicine  Maternal Substance Abuse:  Yes:  Type: Marijuana Significant Maternal Medications:  None Significant Maternal Lab Results:  Lab values include: Group B Strep positive Other Comments:  Left cystic kidney, Mild right Hydro, ? dilated ureter. Needs Peds urology. Quad screen ? drawn 02/2014. Unable to find result. Living  in shelter durign pregnancy. PNC in multiple places.   Review of Systems  Constitutional: Negative for fever and chills.  Eyes: Negative for blurred vision.  Gastrointestinal: Positive for abdominal pain (contractions).  Neurological: Negative for headaches.    Dilation: 4.5 Effacement (%): 90 Station: -2 Exam by:: L. Paschal, RN  Blood pressure 153/86, pulse 85, temperature 98.6 F (37 C), temperature source Oral, resp. rate 20, height 5' 9.5" (1.765 m), weight 107.502 kg (237 lb), last menstrual period 11/17/2013, SpO2 100 %, unknown if currently breastfeeding. Maternal Exam:  Uterine Assessment:  Contraction strength is moderate.  Contraction frequency is irregular.   Abdomen: Surgical scars: low transverse.   Estimated fetal weight is 6-12 in US 08/25/14.   Fetal presentation: vertex  Introitus: Normal vulva. Pelvis: adequate for delivery.      Fetal Exam Fetal Monitor Review: Mode: ultrasound.   Baseline rate: 135.  Variability: moderate (6-25 bpm).   Pattern: accelerations present and no decelerations.    Fetal State Assessment: Category I - tracings are normal.     Physical Exam  Nursing note and vitals reviewed. Constitutional: She is oriented to person, place, and time. She appears well-developed and well-nourished. She appears distressed.  HENT:  Head: Normocephalic.  Cardiovascular: Normal rate, regular rhythm and normal heart sounds.   Respiratory: Effort normal and breath sounds normal.  GI: Soft. There is no tenderness.  Genitourinary: Vagina normal.  Musculoskeletal: She exhibits no edema.  Neurological: She is alert and oriented to person, place, and time. She has normal reflexes.  Skin: Skin is warm and dry.  Psychiatric: She has a normal mood and affect.    Prenatal labs: ABO, Rh: AB/POS/-- (05/27 1158) Antibody: NEG (05/27 1158) Rubella: 8.69 (05/27 1158) RPR: NON REAC (10/22 1420)  HBsAg: NEGATIVE (05/27 1158)  HIV: NONREACTIVE (10/22 1420)  GBS: Positive (10/22 0000)   Assessment: 1. Labor: Early  2. Fetal Wellbeing: Category I  3. Pain Control: None 4. GBS: Pos 5. 39.3 week IUP 6. Gest HTN vs CHTN 7. Fetal renal anomaly  Plan:  1. Admit to BS per consult with MD 2. Routine L&D orders 3. Analgesia/anesthesia PRN  4. Pre-E labs 5. SW consult PP. 6. PCN 7. VBAC consent   Stacy KinsmanSMITH, Stacy Moss 09/02/2014, 2:42 PM

## 2014-09-02 NOTE — Anesthesia Procedure Notes (Signed)
Epidural Patient location during procedure: OB Start time: 09/02/2014 6:17 PM  Staffing Anesthesiologist: Brayton CavesJACKSON, Laney Bagshaw Performed by: anesthesiologist   Preanesthetic Checklist Completed: patient identified, site marked, surgical consent, pre-op evaluation, timeout performed, IV checked, risks and benefits discussed and monitors and equipment checked  Epidural Patient position: sitting Prep: site prepped and draped and DuraPrep Patient monitoring: continuous pulse ox and blood pressure Approach: midline Location: L3-L4 Injection technique: LOR air  Needle:  Needle type: Tuohy  Needle gauge: 17 G Needle length: 9 cm and 9 Needle insertion depth: 7 cm Catheter type: closed end flexible Catheter size: 19 Gauge Catheter at skin depth: 12 cm Test dose: negative  Assessment Events: blood not aspirated, injection not painful, no injection resistance, negative IV test and no paresthesia  Additional Notes Patient identified.  Risk benefits discussed including failed block, incomplete pain control, headache, nerve damage, paralysis, blood pressure changes, nausea, vomiting, reactions to medication both toxic or allergic, and postpartum back pain.  Patient expressed understanding and wished to proceed.  All questions were answered.  Sterile technique used throughout procedure and epidural site dressed with sterile barrier dressing. No paresthesia or other complications noted.The patient did not experience any signs of intravascular injection such as tinnitus or metallic taste in mouth nor signs of intrathecal spread such as rapid motor block. Please see nursing notes for vital signs.

## 2014-09-02 NOTE — Progress Notes (Signed)
   Stacy Moss is a 23 y.o. G3P1011 at 7274w3d  admitted for early labor/GHTN  Subjective: Comfortable with epidural  Objective: Filed Vitals:   09/02/14 1931 09/02/14 2007 09/02/14 2031 09/02/14 2101  BP: 135/54 124/64 134/81 151/81  Pulse: 80 70 71 71  Temp:      TempSrc:      Resp: 18 18 18 18   Height:      Weight:      SpO2:       Total I/O In: -  Out: 200 [Urine:200]  FHT:  FHR: 140 bpm, variability: moderate,  accelerations:  Present,  decelerations:  Absent UC:   Nothing on EFM, but occ ctx palpable as mild SVE:   Dilation: 4.5 Effacement (%): 90 Station: -2 Exam by:: F. Cresenzo,CNM AROM with clear fluid and IUPC placed  Labs: Lab Results  Component Value Date   WBC 12.7* 09/02/2014   HGB 11.4* 09/02/2014   HCT 34.0* 09/02/2014   MCV 90.7 09/02/2014   PLT 201 09/02/2014    Assessment / Plan: GHTN.  Early labor, stalled out If labor does not pick up in an hour or so, will start low dose pitocin Labor: no Fetal Wellbeing:  Category I Pain Control:  Epidural Anticipated MOD:  NSVD  CRESENZO-DISHMAN,Stacy Moss 09/02/2014, 9:23 PM

## 2014-09-03 ENCOUNTER — Encounter (HOSPITAL_COMMUNITY): Payer: Self-pay | Admitting: *Deleted

## 2014-09-03 DIAGNOSIS — O133 Gestational [pregnancy-induced] hypertension without significant proteinuria, third trimester: Secondary | ICD-10-CM

## 2014-09-03 DIAGNOSIS — Z3A39 39 weeks gestation of pregnancy: Secondary | ICD-10-CM

## 2014-09-03 DIAGNOSIS — O99824 Streptococcus B carrier state complicating childbirth: Secondary | ICD-10-CM

## 2014-09-03 LAB — COMPREHENSIVE METABOLIC PANEL
ALT: 7 U/L (ref 0–35)
AST: 15 U/L (ref 0–37)
Albumin: 2.6 g/dL — ABNORMAL LOW (ref 3.5–5.2)
Alkaline Phosphatase: 126 U/L — ABNORMAL HIGH (ref 39–117)
Anion gap: 11 (ref 5–15)
BILIRUBIN TOTAL: 0.6 mg/dL (ref 0.3–1.2)
BUN: 5 mg/dL — ABNORMAL LOW (ref 6–23)
CHLORIDE: 101 meq/L (ref 96–112)
CO2: 21 mEq/L (ref 19–32)
Calcium: 8.9 mg/dL (ref 8.4–10.5)
Creatinine, Ser: 0.53 mg/dL (ref 0.50–1.10)
GLUCOSE: 99 mg/dL (ref 70–99)
Potassium: 3.8 mEq/L (ref 3.7–5.3)
Sodium: 133 mEq/L — ABNORMAL LOW (ref 137–147)
Total Protein: 6.1 g/dL (ref 6.0–8.3)

## 2014-09-03 LAB — URINE CULTURE: Special Requests: NORMAL

## 2014-09-03 LAB — CBC
HCT: 31.7 % — ABNORMAL LOW (ref 36.0–46.0)
HEMATOCRIT: 30.8 % — AB (ref 36.0–46.0)
Hemoglobin: 10.4 g/dL — ABNORMAL LOW (ref 12.0–15.0)
Hemoglobin: 10.8 g/dL — ABNORMAL LOW (ref 12.0–15.0)
MCH: 30.6 pg (ref 26.0–34.0)
MCH: 30.9 pg (ref 26.0–34.0)
MCHC: 33.8 g/dL (ref 30.0–36.0)
MCHC: 34.1 g/dL (ref 30.0–36.0)
MCV: 90.6 fL (ref 78.0–100.0)
MCV: 90.6 fL (ref 78.0–100.0)
PLATELETS: 191 10*3/uL (ref 150–400)
Platelets: 168 10*3/uL (ref 150–400)
RBC: 3.4 MIL/uL — AB (ref 3.87–5.11)
RBC: 3.5 MIL/uL — ABNORMAL LOW (ref 3.87–5.11)
RDW: 12.6 % (ref 11.5–15.5)
RDW: 12.5 % (ref 11.5–15.5)
WBC: 12 10*3/uL — ABNORMAL HIGH (ref 4.0–10.5)
WBC: 15.8 10*3/uL — AB (ref 4.0–10.5)

## 2014-09-03 MED ORDER — BISACODYL 10 MG RE SUPP: 10.0000 mg | Freq: Every day | RECTAL | Status: DC | PRN

## 2014-09-03 MED ORDER — DIPHENHYDRAMINE HCL 25 MG PO CAPS: 25.0000 mg | ORAL_CAPSULE | Freq: Four times a day (QID) | ORAL | Status: DC | PRN

## 2014-09-03 MED ORDER — WITCH HAZEL-GLYCERIN EX PADS: 1.0000 "application " | MEDICATED_PAD | CUTANEOUS | Status: DC | PRN

## 2014-09-03 MED ORDER — FLEET ENEMA 7-19 GM/118ML RE ENEM: 1.0000 | ENEMA | Freq: Every day | RECTAL | Status: DC | PRN

## 2014-09-03 MED ORDER — OXYCODONE-ACETAMINOPHEN 5-325 MG PO TABS
2.0000 | ORAL_TABLET | ORAL | Status: DC | PRN
  Administered 2014-09-04: 2 via ORAL
  Filled 2014-09-03: qty 2

## 2014-09-03 MED ORDER — DIBUCAINE 1 % RE OINT
1.0000 "application " | TOPICAL_OINTMENT | RECTAL | Status: DC | PRN
  Administered 2014-09-03: 1 via RECTAL
  Filled 2014-09-03: qty 28

## 2014-09-03 MED ORDER — METHYLERGONOVINE MALEATE 0.2 MG/ML IJ SOLN: 0.2000 mg | INTRAMUSCULAR | Status: DC | PRN

## 2014-09-03 MED ORDER — ONDANSETRON HCL 4 MG/2ML IJ SOLN: 4.0000 mg | INTRAMUSCULAR | Status: DC | PRN

## 2014-09-03 MED ORDER — PNEUMOCOCCAL VAC POLYVALENT 25 MCG/0.5ML IJ INJ
0.5000 mL | INJECTION | INTRAMUSCULAR | Status: DC
  Filled 2014-09-03: qty 0.5

## 2014-09-03 MED ORDER — OXYCODONE-ACETAMINOPHEN 5-325 MG PO TABS
1.0000 | ORAL_TABLET | ORAL | Status: DC | PRN
  Administered 2014-09-03 – 2014-09-05 (×9): 1 via ORAL
  Filled 2014-09-03 (×9): qty 1

## 2014-09-03 MED ORDER — OXYTOCIN 40 UNITS IN LACTATED RINGERS INFUSION - SIMPLE MED: 62.5000 mL/h | INTRAVENOUS | Status: DC | PRN

## 2014-09-03 MED ORDER — TETANUS-DIPHTH-ACELL PERTUSSIS 5-2.5-18.5 LF-MCG/0.5 IM SUSP: 0.5000 mL | Freq: Once | INTRAMUSCULAR | Status: DC

## 2014-09-03 MED ORDER — SIMETHICONE 80 MG PO CHEW: 80.0000 mg | CHEWABLE_TABLET | ORAL | Status: DC | PRN

## 2014-09-03 MED ORDER — LANOLIN HYDROUS EX OINT: TOPICAL_OINTMENT | CUTANEOUS | Status: DC | PRN

## 2014-09-03 MED ORDER — METHYLERGONOVINE MALEATE 0.2 MG PO TABS: 0.2000 mg | ORAL_TABLET | ORAL | Status: DC | PRN

## 2014-09-03 MED ORDER — MEASLES, MUMPS & RUBELLA VAC ~~LOC~~ INJ: 0.5000 mL | INJECTION | Freq: Once | SUBCUTANEOUS | Status: DC

## 2014-09-03 MED ORDER — PRENATAL MULTIVITAMIN CH
1.0000 | ORAL_TABLET | Freq: Every day | ORAL | Status: DC
  Administered 2014-09-03 – 2014-09-05 (×3): 1 via ORAL
  Filled 2014-09-03 (×3): qty 1

## 2014-09-03 MED ORDER — FERROUS SULFATE 325 (65 FE) MG PO TABS
325.0000 mg | ORAL_TABLET | Freq: Two times a day (BID) | ORAL | Status: DC
  Administered 2014-09-03 – 2014-09-05 (×4): 325 mg via ORAL
  Filled 2014-09-03 (×4): qty 1

## 2014-09-03 MED ORDER — ONDANSETRON HCL 4 MG PO TABS: 4.0000 mg | ORAL_TABLET | ORAL | Status: DC | PRN

## 2014-09-03 MED ORDER — SENNOSIDES-DOCUSATE SODIUM 8.6-50 MG PO TABS
2.0000 | ORAL_TABLET | ORAL | Status: DC
  Administered 2014-09-04 (×2): 2 via ORAL
  Filled 2014-09-03 (×2): qty 2

## 2014-09-03 MED ORDER — BENZOCAINE-MENTHOL 20-0.5 % EX AERO
1.0000 "application " | INHALATION_SPRAY | CUTANEOUS | Status: DC | PRN
  Administered 2014-09-03: 1 via TOPICAL
  Filled 2014-09-03: qty 56

## 2014-09-03 MED ORDER — IBUPROFEN 600 MG PO TABS
600.0000 mg | ORAL_TABLET | Freq: Four times a day (QID) | ORAL | Status: DC
  Administered 2014-09-03 – 2014-09-05 (×9): 600 mg via ORAL
  Filled 2014-09-03 (×9): qty 1

## 2014-09-03 MED ORDER — ZOLPIDEM TARTRATE 5 MG PO TABS: 5.0000 mg | ORAL_TABLET | Freq: Every evening | ORAL | Status: DC | PRN

## 2014-09-03 NOTE — Progress Notes (Signed)
   Renezmae Lucretia RoersM Fitzsimmons is a 23 y.o. G3P1011 at 7477w3d  [facility]  Subjective: Comfortable with epidural  Objective: Filed Vitals:   09/03/14 0129 09/03/14 0131 09/03/14 0134 09/03/14 0139  BP:  136/73    Pulse: 73 70 72 95  Temp:  98.2 F (36.8 C)    TempSrc:  Oral    Resp:  20    Height:      Weight:      SpO2:       Total I/O In: -  Out: 1700 [Urine:1700]  FHT:  FHR: 140 bpm, variability: moderate,  accelerations:  Present,  decelerations:  Present had a variable decel, now resolved UC:   MVU ~ 100 SVE:   Dilation: 5 Effacement (%): 90 Station: 0 Exam by:: Lcarpenter<RN Pitocin @ 5 mu/min  Labs: Lab Results  Component Value Date   WBC 12.7* 09/02/2014   HGB 11.4* 09/02/2014   HCT 34.0* 09/02/2014   MCV 90.7 09/02/2014   PLT 201 09/02/2014    Assessment / Plan: early labor, stalled out.  Continue to increase pitocin prn labor  Labor: early Fetal Wellbeing:  Category I Pain Control:  Epidural Anticipated MOD:  NSVD  CRESENZO-DISHMAN,Cloma Rahrig 09/03/2014, 3:03 AM

## 2014-09-03 NOTE — Progress Notes (Signed)
UR chart review completed.  

## 2014-09-03 NOTE — Plan of Care (Signed)
Problem: Phase I Progression Outcomes Goal: Pain controlled with appropriate interventions Outcome: Completed/Met Date Met:  09/03/14 Goal: Voiding adequately Outcome: Completed/Met Date Met:  09/03/14 Goal: OOB as tolerated unless otherwise ordered Outcome: Completed/Met Date Met:  09/03/14 Goal: VS, stable, temp < 100.4 degrees F Outcome: Completed/Met Date Met:  09/03/14 Goal: Initial discharge plan identified Outcome: Completed/Met Date Met:  09/03/14  Problem: Phase II Progression Outcomes Goal: Tolerating diet Outcome: Completed/Met Date Met:  09/03/14

## 2014-09-03 NOTE — Plan of Care (Signed)
Problem: Phase II Progression Outcomes Goal: Progress activity as tolerated unless otherwise ordered Outcome: Completed/Met Date Met:  09/03/14 Goal: Afebrile, VS remain stable Outcome: Completed/Met Date Met:  09/03/14     

## 2014-09-03 NOTE — Lactation Note (Signed)
This note was copied from the chart of Stacy Raiford SimmondsDesiree Sylva. Lactation Consultation Note  Patient Name: Stacy Raiford SimmondsDesiree Kitchings WUJWJ'XToday's Date: 09/03/2014 Reason for consult: Initial assessment Baby 10 hours of life. Mom reports BF going well. Mom states that she attempted to nurse first child, but gave up quickly. Mom has just called to let nurse know that baby is back in room from a procedure/scan. Other personnel in room. [facility] given [facility], aware of OP/BFSG, community resources, and [facility] phone [facility] services after discharge. Mom asked about giving formula and breastfeeding. Discussed with mom risks of formula/benefits of breastmilk. Enc exclusive breastfeeding for first four weeks for good supply, and then the option of pumping and bottle-feeding EBM as needed. Mom asked about a DEBP, [facility] until Monday, 09-06-14. Enc mom to call WIC on Monday, make and appointment, and discuss getting a pump. Also reviewed [facility]. Enc mom to call out for assistance as needed with latching baby.   Maternal Data    Feeding Feeding Type:  (Mom has visitors in room, including nursing student, instructor, and staff, baby just back from scan and about to have vitals taken.)  [facility]                      Lactation Tools Discussed/Used     Consult Status Consult Status: Follow-up Date: 09/04/14 Follow-up type: In-patient    Geralynn OchsWILLIARD, Sheyli Horwitz 09/03/2014, 4:41 PM

## 2014-09-03 NOTE — Anesthesia Postprocedure Evaluation (Signed)
  Anesthesia Post-op Note  Patient: Stacy NashDesiree M Moss  Procedure(s) Performed: * No procedures listed *  Patient Location: PACU  Anesthesia Type:Epidural  Level of Consciousness: awake, alert  and oriented  Airway and Oxygen Therapy: Patient Spontanous Breathing  Post-op Pain: mild  Post-op Assessment: Post-op Vital signs reviewed, Patient's Cardiovascular Status Stable, Respiratory Function Stable, No signs of Nausea or vomiting, Adequate PO intake, Pain level controlled, No headache, No backache, No residual numbness and No residual motor weakness  Post-op Vital Signs: Reviewed and stable  Last Vitals:  Filed Vitals:   09/03/14 1644  BP: 152/72  Pulse: 97  Temp: 36.6 C  Resp: 18    Complications: No apparent anesthesia complications

## 2014-09-04 NOTE — Plan of Care (Signed)
Problem: Consults Goal: Postpartum Patient Education (See Patient Education module for education specifics.)  Outcome: Completed/Met Date Met:  09/04/14  Problem: Phase II Progression Outcomes Goal: Pain controlled on oral analgesia Outcome: Completed/Met Date Met:  09/04/14
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
## 2014-09-04 NOTE — Plan of Care (Signed)
Problem: Consults Goal: Nutrition Consult-if indicated Outcome: Not Applicable Date Met:  37/54/36  Problem: Phase I Progression Outcomes Goal: Other Phase I Outcomes/Goals Outcome: Not Applicable Date Met:  06/77/03  Problem: Phase II Progression Outcomes Goal: Other Phase II Outcomes/Goals Outcome: Not Applicable Date Met:  40/35/24

## 2014-09-04 NOTE — Progress Notes (Signed)
Post Partum Day #1 Subjective: no complaints and tolerating PO; she is breast and bottlefeeding; desires Nexplanon for contraception; BPs in last 12+ hrs within nl range  Objective: Blood pressure 127/70, pulse 77, temperature 97.1 F (36.2 C), temperature source Oral, resp. rate 18, height 5' 9.5" (1.765 m), weight 107.502 kg (237 lb), last menstrual period 11/17/2013, SpO2 100 %, unknown if currently breastfeeding.  Physical Exam:  General: alert, cooperative and no distress  Lungs: nl effort Heart: RRR Lochia: appropriate Uterine Fundus: firm DVT Evaluation: No evidence of DVT seen on physical exam.   Recent Labs  09/03/14 0650 09/03/14 0913  HGB 10.4* 10.8*  HCT 30.8* 31.7*    Assessment/Plan: Plan for discharge tomorrow   LOS: 2 days   Cam HaiSHAW, KIMBERLY CNM 09/04/2014, 8:56 AM
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
## 2014-09-05 MED ORDER — IBUPROFEN 600 MG PO TABS: 600.0000 mg | ORAL_TABLET | Freq: Four times a day (QID) | ORAL | Status: DC

## 2014-09-05 NOTE — Plan of Care (Signed)
Problem: Discharge Progression Outcomes Goal: Barriers To Progression Addressed/Resolved Outcome: Not Applicable Date Met:  90/21/11 Goal: MMR given as ordered Outcome: Not Applicable Date Met:  55/20/80 Goal: Other Discharge Outcomes/Goals Outcome: Not Applicable Date Met:  22/33/61

## 2014-09-05 NOTE — Progress Notes (Signed)
Sp[facility] with mother during earlier visit about [facility] and she was very receptive.

## 2014-09-05 NOTE — Discharge Instructions (Signed)
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
## 2014-09-05 NOTE — Lactation Note (Signed)
This note was copied from the chart of Stacy Raiford SimmondsDesiree Chartrand. Lactation Consultation Note: Mother has been supplementing with formula. Discussed supply and demand. Lots of teaching with parents. Discussed cue base and cluster feeding. Mother states she is active with WIC. Discussed WIC loaner and mother states she will use the hand pump.  Mother was given a manual hand pump with a #27 flange. Mother has large amts of colostrum. Mothers breast are filling. Reviewed baby and me book. Mother advised to feed infant 8-12 times in 24 hours. Advised to limit use of the pacifier. [facility] prn.   Patient Name: Stacy Moss ZOXWR'UToday's Date: 09/05/2014 Reason for consult: Follow-up assessment   Maternal Data    Feeding Feeding Type: Breast Fed  LATCH Score/Interventions                      Lactation Tools Discussed/Used     Consult Status Consult Status: Complete    Michel BickersKendrick, Polina Burmaster McCoy 09/05/2014, 10:42 AM

## 2014-09-05 NOTE — Discharge Summary (Signed)
Obstetric Discharge Summary Reason for Admission: onset of labor Prenatal Procedures: none Intrapartum Procedures: spontaneous vaginal delivery Postpartum Procedures: none Complications-Operative and Postpartum: none  Hospital Course:  Patient was admitted for active labor. She progressed well with pitocin augmentation.   Delivery Note Once the pt;s labor finally became adequate, she progressed rapidly and was noted to be C/C/+3. After a 3 contraction 2nd stage, At 6:30 AM a viable female was delivered via VBAC, Spontaneous (Presentation: Left Occiput Anterior). APGAR:8/9; weight 6#14oz .  Placenta status: Intact, Spontaneous. Cord: 3 vessels with the following complications: None.   Anesthesia: Epidural  Episiotomy: None Lacerations: None Suture Repair: n/a Est. Blood Loss (mL): 100  Mom to postpartum. Baby to Couplet care / Skin to Skin.  Stacy Moss,Stacy Moss 09/03/2014, 6:52 AM   Postpartum, the patient did well with no complications.  On the day of discharge,  Pt denied problems with ambulating, voiding or po intake.  She denied nausea or vomiting.  Pain was well controlled.  She has had flatus. She has not had bowel movement.  Lochia Small.  Plan for birth control is nexplanon.    H/H: Lab Results  Component Value Date/Time   HGB 10.8* 09/03/2014 09:13 AM   HCT 31.7* 09/03/2014 09:13 AM    Filed Vitals:   09/05/14 0547  BP: 140/86  Pulse: 72  Temp: 97.9 F (36.6 C)  Resp:     Physical Exam: VSS NAD Abd: Appropriately tender, ND, Fundus firm, below umbilicus No c/c/e, Neg homan's sign, neg cords Lochia Appropriate  Discharge Diagnoses: Term Pregnancy-delivered  Discharge Information: Date: 09/05/2014 Activity: pelvic rest Diet: routine  Medications: PNV and Ibuprofen Breast feeding:  Yes Condition: stable Instructions: refer to handout Discharge to: home      Medication List    TAKE these medications        ibuprofen 600 MG tablet   Commonly known as:  ADVIL,MOTRIN  Take 1 tablet (600 mg total) by mouth every 6 (six) hours.           Follow-up Information    [facility] Schedule an appointment as soon as possible for a visit in 6 weeks.   Specialty:  Obstetrics and Gynecology   Why:  for postpartum follow up   Contact information:   [address] [place] [id number] [phone]      Stacy DoeParker, Keerstin Bjelland 09/05/2014,7:47 AM

## 2014-09-06 ENCOUNTER — Ambulatory Visit: Payer: Self-pay

## 2014-09-06 NOTE — Lactation Note (Signed)
This note was copied from the chart of Stacy Raiford SimmondsDesiree Clewis. Lactation Consultation Note     Follow up consult with this mom of a term baby, now 5678 hours old. Mom has been breast feeding, and supplementing occasionally with formula. She has a hand pump, and is going [facility] and [facility]. The baby wa asleep with dad, and mom was eating lunch. Mom denies any questions at this time.   Patient Name: Stacy Moss ZOXWR'UToday's Date: 09/06/2014 Reason for consult: Follow-up assessment   Maternal Data    Feeding Feeding Type: Formula Length of feed: 10 min  LATCH Score/Interventions                      Lactation Tools Discussed/Used     Consult Status Consult Status: Complete Follow-up type: Call as needed    Alfred LevinsLee, Itsel Opfer Anne 09/06/2014, 12:49 PM

## 2014-09-24 ENCOUNTER — Encounter: Payer: Self-pay | Admitting: Obstetrics & Gynecology

## 2014-10-07 ENCOUNTER — Encounter: Payer: Self-pay | Admitting: *Deleted

## 2014-10-22 DIAGNOSIS — Z87898 Personal history of other specified conditions: Secondary | ICD-10-CM

## 2014-10-22 DIAGNOSIS — F1491 Cocaine use, unspecified, in remission: Secondary | ICD-10-CM

## 2014-10-22 DIAGNOSIS — F1910 Other psychoactive substance abuse, uncomplicated: Secondary | ICD-10-CM

## 2014-10-22 HISTORY — DX: Cocaine use, unspecified, in remission: F14.91

## 2014-10-22 HISTORY — DX: Other psychoactive substance abuse, uncomplicated: F19.10

## 2014-10-22 HISTORY — DX: Personal history of other specified conditions: Z87.898

## 2014-10-27 ENCOUNTER — Telehealth: Payer: Self-pay

## 2014-10-27 ENCOUNTER — Ambulatory Visit: Payer: Medicaid Other | Admitting: Obstetrics & Gynecology

## 2014-10-27 NOTE — Telephone Encounter (Signed)
Called pt's home and mobile # to reschedule missed appt. Unable to leave message due message stating "the person you are trying to call is excepting calls at this time".  Letter sent.

## 2014-11-23 ENCOUNTER — Ambulatory Visit: Payer: Medicaid Other | Admitting: Family Medicine

## 2015-02-20 DIAGNOSIS — Z9889 Other specified postprocedural states: Secondary | ICD-10-CM

## 2015-02-20 HISTORY — DX: Other specified postprocedural states: Z98.890

## 2015-02-24 ENCOUNTER — Observation Stay (HOSPITAL_COMMUNITY)
Admission: AD | Admit: 2015-02-24 | Discharge: 2015-02-26 | Disposition: A | Discharge status: Home / Self Care | Payer: Medicaid Other | Source: Ambulatory Visit | Attending: Internal Medicine | Admitting: Internal Medicine

## 2015-02-24 ENCOUNTER — Encounter (HOSPITAL_COMMUNITY): Payer: Self-pay | Admitting: *Deleted

## 2015-02-24 ENCOUNTER — Inpatient Hospital Stay (HOSPITAL_COMMUNITY): Payer: Medicaid Other

## 2015-02-24 DIAGNOSIS — F329 Major depressive disorder, single episode, unspecified: Secondary | ICD-10-CM | POA: Insufficient documentation

## 2015-02-24 DIAGNOSIS — H05822 Myopathy of extraocular muscles, left orbit: Secondary | ICD-10-CM

## 2015-02-24 DIAGNOSIS — I10 Essential (primary) hypertension: Secondary | ICD-10-CM | POA: Insufficient documentation

## 2015-02-24 DIAGNOSIS — S0592XA Unspecified injury of left eye and orbit, initial encounter: Secondary | ICD-10-CM | POA: Diagnosis present

## 2015-02-24 DIAGNOSIS — S023XXA Fracture of orbital floor, initial encounter for closed fracture: Secondary | ICD-10-CM | POA: Diagnosis not present

## 2015-02-24 DIAGNOSIS — Z3A12 12 weeks gestation of pregnancy: Secondary | ICD-10-CM | POA: Diagnosis present

## 2015-02-24 DIAGNOSIS — R5383 Other fatigue: Secondary | ICD-10-CM | POA: Diagnosis present

## 2015-02-24 DIAGNOSIS — F988 Other specified behavioral and emotional disorders with onset usually occurring in childhood and adolescence: Secondary | ICD-10-CM | POA: Diagnosis not present

## 2015-02-24 DIAGNOSIS — F319 Bipolar disorder, unspecified: Secondary | ICD-10-CM | POA: Insufficient documentation

## 2015-02-24 DIAGNOSIS — F419 Anxiety disorder, unspecified: Secondary | ICD-10-CM | POA: Insufficient documentation

## 2015-02-24 DIAGNOSIS — Y929 Unspecified place or not applicable: Secondary | ICD-10-CM | POA: Insufficient documentation

## 2015-02-24 DIAGNOSIS — F17210 Nicotine dependence, cigarettes, uncomplicated: Secondary | ICD-10-CM | POA: Insufficient documentation

## 2015-02-24 LAB — URINALYSIS, ROUTINE W REFLEX MICROSCOPIC
Bilirubin Urine: NEGATIVE
Glucose, UA: NEGATIVE mg/dL
HGB URINE DIPSTICK: NEGATIVE
Ketones, ur: NEGATIVE mg/dL
LEUKOCYTES UA: NEGATIVE
Nitrite: NEGATIVE
PH: 6 (ref 5.0–8.0)
PROTEIN: NEGATIVE mg/dL
UROBILINOGEN UA: 0.2 mg/dL (ref 0.0–1.0)

## 2015-02-24 LAB — CBC WITH DIFFERENTIAL/PLATELET
Basophils Absolute: 0 10*3/uL (ref 0.0–0.1)
Basophils Relative: 0 % (ref 0–1)
EOS ABS: 0 10*3/uL (ref 0.0–0.7)
Eosinophils Relative: 0 % (ref 0–5)
HCT: 30.6 % — ABNORMAL LOW (ref 36.0–46.0)
Hemoglobin: 10.4 g/dL — ABNORMAL LOW (ref 12.0–15.0)
LYMPHS ABS: 1.2 10*3/uL (ref 0.7–4.0)
LYMPHS PCT: 8 % — AB (ref 12–46)
MCH: 29.5 pg (ref 26.0–34.0)
MCHC: 34 g/dL (ref 30.0–36.0)
MCV: 86.9 fL (ref 78.0–100.0)
Monocytes Absolute: 0.3 10*3/uL (ref 0.1–1.0)
Monocytes Relative: 2 % — ABNORMAL LOW (ref 3–12)
NEUTROS PCT: 90 % — AB (ref 43–77)
Neutro Abs: 12.2 10*3/uL — ABNORMAL HIGH (ref 1.7–7.7)
PLATELETS: 220 10*3/uL (ref 150–400)
RBC: 3.52 MIL/uL — ABNORMAL LOW (ref 3.87–5.11)
RDW: 13.3 % (ref 11.5–15.5)
WBC: 13.7 10*3/uL — AB (ref 4.0–10.5)

## 2015-02-24 LAB — BASIC METABOLIC PANEL
Anion gap: 10 (ref 5–15)
BUN: 7 mg/dL (ref 6–20)
CO2: 20 mmol/L — AB (ref 22–32)
CREATININE: 0.47 mg/dL (ref 0.44–1.00)
Calcium: 9.1 mg/dL (ref 8.9–10.3)
Chloride: 105 mmol/L (ref 101–111)
GFR calc non Af Amer: >60 mL/min (ref >60)
GLUCOSE: 102 mg/dL — AB (ref 70–99)
Potassium: 3.7 mmol/L (ref 3.5–5.1)
Sodium: 135 mmol/L (ref 135–145)

## 2015-02-24 LAB — RAPID URINE DRUG SCREEN, HOSP PERFORMED
AMPHETAMINES: NOT DETECTED
Barbiturates: NOT DETECTED
Benzodiazepines: NOT DETECTED
Cocaine: NOT DETECTED
Opiates: NOT DETECTED
TETRAHYDROCANNABINOL: POSITIVE — AB

## 2015-02-24 MED ORDER — SODIUM CHLORIDE 0.9 % IV BOLUS (SEPSIS)
1000.0000 mL | INTRAVENOUS | Status: AC
  Administered 2015-02-24: 1000 mL via INTRAVENOUS
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
## 2015-02-24 NOTE — ED Notes (Signed)
Pa AT BEDSIDE 
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
## 2015-02-24 NOTE — MAU Note (Signed)
Pt transferring to [facility]. Taken by [facility]. Assessment unchanged. A&O, lethargic, vomiting with movement.
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
## 2015-02-25 ENCOUNTER — Inpatient Hospital Stay (HOSPITAL_COMMUNITY): Payer: Medicaid Other | Admitting: Certified Registered"

## 2015-02-25 ENCOUNTER — Encounter (HOSPITAL_COMMUNITY)
Admission: AD | Disposition: A | Discharge status: Home / Self Care | Payer: Self-pay | Source: Ambulatory Visit | Attending: Emergency Medicine

## 2015-02-25 ENCOUNTER — Encounter: Payer: Self-pay | Admitting: Ophthalmology

## 2015-02-25 ENCOUNTER — Encounter (HOSPITAL_COMMUNITY): Payer: Self-pay | Admitting: Certified Registered"

## 2015-02-25 DIAGNOSIS — S023XXA Fracture of orbital floor, initial encounter for closed fracture: Secondary | ICD-10-CM | POA: Diagnosis not present

## 2015-02-25 DIAGNOSIS — F419 Anxiety disorder, unspecified: Secondary | ICD-10-CM | POA: Diagnosis not present

## 2015-02-25 DIAGNOSIS — F319 Bipolar disorder, unspecified: Secondary | ICD-10-CM | POA: Diagnosis not present

## 2015-02-25 DIAGNOSIS — S0592XA Unspecified injury of left eye and orbit, initial encounter: Secondary | ICD-10-CM

## 2015-02-25 DIAGNOSIS — R5383 Other fatigue: Secondary | ICD-10-CM | POA: Diagnosis present

## 2015-02-25 DIAGNOSIS — Z3A12 12 weeks gestation of pregnancy: Secondary | ICD-10-CM | POA: Insufficient documentation

## 2015-02-25 DIAGNOSIS — I10 Essential (primary) hypertension: Secondary | ICD-10-CM | POA: Diagnosis not present

## 2015-02-25 HISTORY — PX: ORIF ORBITAL FRACTURE: SHX5312

## 2015-02-25 LAB — CBC WITH DIFFERENTIAL/PLATELET
Basophils Absolute: 0 10*3/uL (ref 0.0–0.1)
Basophils Relative: 0 % (ref 0–1)
EOS ABS: 0 10*3/uL (ref 0.0–0.7)
Eosinophils Relative: 0 % (ref 0–5)
HCT: 29.1 % — ABNORMAL LOW (ref 36.0–46.0)
HEMOGLOBIN: 9.7 g/dL — AB (ref 12.0–15.0)
LYMPHS ABS: 2.2 10*3/uL (ref 0.7–4.0)
Lymphocytes Relative: 13 % (ref 12–46)
MCH: 29.2 pg (ref 26.0–34.0)
MCHC: 33.3 g/dL (ref 30.0–36.0)
MCV: 87.7 fL (ref 78.0–100.0)
Monocytes Absolute: 0.7 10*3/uL (ref 0.1–1.0)
Monocytes Relative: 4 % (ref 3–12)
NEUTROS PCT: 83 % — AB (ref 43–77)
Neutro Abs: 13.6 10*3/uL — ABNORMAL HIGH (ref 1.7–7.7)
PLATELETS: 237 10*3/uL (ref 150–400)
RBC: 3.32 MIL/uL — ABNORMAL LOW (ref 3.87–5.11)
RDW: 13.5 % (ref 11.5–15.5)
WBC: 16.5 10*3/uL — ABNORMAL HIGH (ref 4.0–10.5)

## 2015-02-25 LAB — BASIC METABOLIC PANEL
ANION GAP: 7 (ref 5–15)
BUN: 6 mg/dL (ref 6–20)
CO2: 23 mmol/L (ref 22–32)
Calcium: 8.6 mg/dL — ABNORMAL LOW (ref 8.9–10.3)
Chloride: 104 mmol/L (ref 101–111)
Creatinine, Ser: 0.57 mg/dL (ref 0.44–1.00)
GFR calc Af Amer: >60 mL/min (ref >60)
GFR calc non Af Amer: >60 mL/min (ref >60)
Glucose, Bld: 95 mg/dL (ref 70–99)
Potassium: 3.8 mmol/L (ref 3.5–5.1)
Sodium: 134 mmol/L — ABNORMAL LOW (ref 135–145)

## 2015-02-25 LAB — POCT PREGNANCY, URINE: Preg Test, Ur: POSITIVE — AB

## 2015-02-25 SURGERY — OPEN REDUCTION INTERNAL FIXATION (ORIF) ORBITAL FRACTURE
Anesthesia: General | Site: Eye | Laterality: Left

## 2015-02-25 MED ORDER — CLINDAMYCIN PHOSPHATE 900 MG/50ML IV SOLN
INTRAVENOUS | Status: AC
  Filled 2015-02-25: qty 50

## 2015-02-25 MED ORDER — ONDANSETRON HCL 4 MG/2ML IJ SOLN: 4.0000 mg | Freq: Four times a day (QID) | INTRAMUSCULAR | Status: DC | PRN

## 2015-02-25 MED ORDER — GELATIN ADSORBABLE OP FILM
ORAL_FILM | OPHTHALMIC | Status: DC | PRN
  Administered 2015-02-25: 1 via OPHTHALMIC

## 2015-02-25 MED ORDER — BACITRACIN ZINC 500 UNIT/GM EX OINT
TOPICAL_OINTMENT | CUTANEOUS | Status: DC | PRN
  Administered 2015-02-25: 1 via TOPICAL

## 2015-02-25 MED ORDER — 0.9 % SODIUM CHLORIDE (POUR BTL) OPTIME
TOPICAL | Status: DC | PRN
  Administered 2015-02-25: 1000 mL

## 2015-02-25 MED ORDER — FENTANYL CITRATE (PF) 250 MCG/5ML IJ SOLN
INTRAMUSCULAR | Status: AC
  Filled 2015-02-25: qty 5

## 2015-02-25 MED ORDER — ACETAMINOPHEN 650 MG RE SUPP: 650.0000 mg | Freq: Four times a day (QID) | RECTAL | Status: DC | PRN

## 2015-02-25 MED ORDER — GELATIN ADSORBABLE OP FILM
ORAL_FILM | OPHTHALMIC | Status: AC
  Filled 2015-02-25: qty 1

## 2015-02-25 MED ORDER — FENTANYL CITRATE (PF) 100 MCG/2ML IJ SOLN
INTRAMUSCULAR | Status: DC | PRN
  Administered 2015-02-25: 150 ug via INTRAVENOUS
  Administered 2015-02-25 (×2): 50 ug via INTRAVENOUS

## 2015-02-25 MED ORDER — BACITRACIN ZINC 500 UNIT/GM EX OINT
TOPICAL_OINTMENT | CUTANEOUS | Status: AC
  Filled 2015-02-25: qty 28.35

## 2015-02-25 MED ORDER — SODIUM CHLORIDE 0.9 % IJ SOLN
3.0000 mL | Freq: Two times a day (BID) | INTRAMUSCULAR | Status: DC
  Administered 2015-02-25 (×2): 3 mL via INTRAVENOUS

## 2015-02-25 MED ORDER — ONDANSETRON HCL 4 MG/2ML IJ SOLN
INTRAMUSCULAR | Status: DC | PRN
  Administered 2015-02-25: 4 mg via INTRAVENOUS

## 2015-02-25 MED ORDER — ONDANSETRON HCL 4 MG PO TABS
4.0000 mg | ORAL_TABLET | Freq: Four times a day (QID) | ORAL | Status: DC | PRN
  Administered 2015-02-26: 4 mg via ORAL
  Filled 2015-02-25: qty 1

## 2015-02-25 MED ORDER — IBUPROFEN 400 MG PO TABS
400.0000 mg | ORAL_TABLET | ORAL | Status: DC | PRN
  Administered 2015-02-25 (×2): 400 mg via ORAL
  Filled 2015-02-25 (×3): qty 1

## 2015-02-25 MED ORDER — PROPOFOL 10 MG/ML IV BOLUS
INTRAVENOUS | Status: DC | PRN
  Administered 2015-02-25: 150 mg via INTRAVENOUS
  Administered 2015-02-25: 50 mg via INTRAVENOUS

## 2015-02-25 MED ORDER — LIDOCAINE-EPINEPHRINE 1 %-1:100000 IJ SOLN
INTRAMUSCULAR | Status: AC
  Filled 2015-02-25: qty 1

## 2015-02-25 MED ORDER — ACETAMINOPHEN 325 MG PO TABS
650.0000 mg | ORAL_TABLET | Freq: Four times a day (QID) | ORAL | Status: DC | PRN
  Administered 2015-02-25 – 2015-02-26 (×2): 650 mg via ORAL
  Filled 2015-02-25 (×2): qty 2

## 2015-02-25 MED ORDER — LIDOCAINE-EPINEPHRINE 1 %-1:100000 IJ SOLN
INTRAMUSCULAR | Status: DC | PRN
  Administered 2015-02-25: 6 mL via INTRADERMAL

## 2015-02-25 MED ORDER — HYDROMORPHONE HCL 1 MG/ML IJ SOLN
0.2500 mg | INTRAMUSCULAR | Status: DC | PRN
  Administered 2015-02-25: 0.5 mg via INTRAVENOUS

## 2015-02-25 MED ORDER — PROMETHAZINE HCL 25 MG/ML IJ SOLN: 6.2500 mg | INTRAMUSCULAR | Status: DC | PRN

## 2015-02-25 MED ORDER — ONDANSETRON HCL 4 MG/2ML IJ SOLN
INTRAMUSCULAR | Status: AC
  Filled 2015-02-25: qty 2

## 2015-02-25 MED ORDER — SUCCINYLCHOLINE CHLORIDE 20 MG/ML IJ SOLN
INTRAMUSCULAR | Status: DC | PRN
  Administered 2015-02-25: 120 mg via INTRAVENOUS

## 2015-02-25 MED ORDER — PROPOFOL 10 MG/ML IV BOLUS
INTRAVENOUS | Status: AC
  Filled 2015-02-25: qty 20

## 2015-02-25 MED ORDER — LIDOCAINE HCL (CARDIAC) 20 MG/ML IV SOLN
INTRAVENOUS | Status: DC | PRN
  Administered 2015-02-25: 100 mg via INTRAVENOUS

## 2015-02-25 MED ORDER — CLINDAMYCIN PHOSPHATE 600 MG/50ML IV SOLN
600.0000 mg | Freq: Once | INTRAVENOUS | Status: AC
  Administered 2015-02-25: 900 mg via INTRAVENOUS

## 2015-02-25 MED ORDER — MORPHINE SULFATE 2 MG/ML IJ SOLN
1.0000 mg | INTRAMUSCULAR | Status: DC | PRN
  Administered 2015-02-25 (×3): 1 mg via INTRAVENOUS
  Filled 2015-02-25 (×4): qty 1

## 2015-02-25 MED ORDER — LACTATED RINGERS IV SOLN
INTRAVENOUS | Status: DC | PRN
  Administered 2015-02-25: 04:00:00 via INTRAVENOUS

## 2015-02-25 MED ORDER — HYDROMORPHONE HCL 1 MG/ML IJ SOLN
INTRAMUSCULAR | Status: AC
  Filled 2015-02-25: qty 1

## 2015-02-25 SURGICAL SUPPLY — 43 items
BLADE SURG 15 STRL LF DISP TIS (BLADE) IMPLANT
BLADE SURG 15 STRL SS (BLADE)
BLADE SURG ROTATE 9660 (MISCELLANEOUS) IMPLANT
CANISTER SUCTION 2500CC (MISCELLANEOUS) ×2 IMPLANT
CLEANER TIP ELECTROSURG 2X2 (MISCELLANEOUS) ×2 IMPLANT
COVER SURGICAL LIGHT HANDLE (MISCELLANEOUS) ×2 IMPLANT
DECANTER SPIKE VIAL GLASS SM (MISCELLANEOUS) IMPLANT
DRAPE PROXIMA HALF (DRAPES) IMPLANT
DRSG NASOPORE 8CM (GAUZE/BANDAGES/DRESSINGS) IMPLANT
ELECT COATED BLADE 2.86 ST (ELECTRODE) ×2 IMPLANT
ELECT REM PT RETURN 9FT ADLT (ELECTROSURGICAL) ×2
ELECTRODE REM PT RTRN 9FT ADLT (ELECTROSURGICAL) ×1 IMPLANT
GLOVE SURG SS PI 7.5 STRL IVOR (GLOVE) ×2 IMPLANT
GOWN STRL REUS W/ TWL LRG LVL3 (GOWN DISPOSABLE) ×1 IMPLANT
GOWN STRL REUS W/TWL LRG LVL3 (GOWN DISPOSABLE) ×1
GOWN STRL REUS W/TWL XL LVL4 (GOWN DISPOSABLE) ×2 IMPLANT
KIT BASIN OR (CUSTOM PROCEDURE TRAY) ×2 IMPLANT
KIT ROOM TURNOVER OR (KITS) ×2 IMPLANT
NEEDLE 27GAX1X1/2 (NEEDLE) IMPLANT
NEEDLE HYPO 25GX1X1/2 BEV (NEEDLE) ×2 IMPLANT
NS IRRIG 1000ML POUR BTL (IV SOLUTION) ×2 IMPLANT
PAD ARMBOARD 7.5X6 YLW CONV (MISCELLANEOUS) ×4 IMPLANT
PENCIL BUTTON HOLSTER BLD 10FT (ELECTRODE) ×2 IMPLANT
SCISSORS WIRE ANG 4 3/4 DISP (INSTRUMENTS) IMPLANT
SUT CHROMIC 4 0 PS 2 18 (SUTURE) ×2 IMPLANT
SUT ETHILON 4 0 CL P 3 (SUTURE) IMPLANT
SUT MON AB 3-0 SH 27 (SUTURE)
SUT MON AB 3-0 SH27 (SUTURE) IMPLANT
SUT PROLENE 4 0 PS 2 18 (SUTURE) ×2 IMPLANT
SUT PROLENE 6 0 PC 1 (SUTURE) IMPLANT
SUT SILK 2 0 SH (SUTURE) ×4 IMPLANT
SUT STEEL 0 (SUTURE)
SUT STEEL 0 18XMFL TIE 17 (SUTURE) IMPLANT
SUT STEEL 1 (SUTURE) IMPLANT
SUT STEEL 2 (SUTURE) IMPLANT
SUT STEEL 4 (SUTURE) IMPLANT
SUT VIC AB 4-0 SH 27 (SUTURE) ×1
SUT VIC AB 4-0 SH 27XBRD (SUTURE) ×1 IMPLANT
SUT VICRYL 4-0 PS2 18IN ABS (SUTURE) IMPLANT
TOWEL OR 17X24 6PK STRL BLUE (TOWEL DISPOSABLE) ×2 IMPLANT
TRAY ENT MC OR (CUSTOM PROCEDURE TRAY) ×2 IMPLANT
TRAY FOLEY CATH 14FRSI W/METER (CATHETERS) IMPLANT
WATER STERILE IRR 1000ML POUR (IV SOLUTION) IMPLANT

## 2015-02-25 NOTE — ED Notes (Signed)
ENT MAXILLOFACIAL paged to [phone]
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
## 2015-02-25 NOTE — Clinical Social Work Note (Signed)
CSW received consult for substance abuse for patient, she tested positive for Marijuana, CSW did not have time to speak with patient about if she wanted resources for substance abuse.  Will ask weekend or evening CSW to give information to patient.  Ervin KnackEric R. Khamil Lamica, MSW, Theresia MajorsLCSWA [phone] 02/25/2015 6:29 PM

## 2015-02-25 NOTE — Transfer of Care (Signed)
Immediate Anesthesia Transfer of Care Note  Patient: Stacy NashDesiree M Moss  Procedure(s) Performed: Procedure(s): OPEN REDUCTION INTERNAL FIXATION (ORIF) LEFT ORBITAL FRACTURE (Left)  Patient Location: PACU  Anesthesia Type:General  Level of Consciousness: awake, alert  and oriented  Airway & Oxygen Therapy: Patient Spontanous Breathing  Post-op Assessment: Report given to RN and Post -op Vital signs reviewed and stable  Post vital signs: Reviewed and stable  Last Vitals:  Filed Vitals:   02/25/15 0245  BP: 116/60  Pulse: 54  Temp:   Resp:     Complications: No apparent anesthesia complications

## 2015-02-25 NOTE — Progress Notes (Signed)
UR completed 

## 2015-02-25 NOTE — Anesthesia Postprocedure Evaluation (Signed)
Anesthesia Post Note  Patient: Stacy NashDesiree M Moss  Procedure(s) Performed: Procedure(s) (LRB): OPEN REDUCTION INTERNAL FIXATION (ORIF) LEFT ORBITAL FRACTURE (Left)  Anesthesia type: general  Patient location: PACU  Post pain: Pain level controlled  Post assessment: Patient's Cardiovascular Status Stable  Last Vitals:  Filed Vitals:   02/25/15 0614  BP:   Pulse: 86  Temp:   Resp: 21    Post vital signs: Reviewed and stable  Level of consciousness: sedated  Complications: No apparent anesthesia complications

## 2015-02-25 NOTE — Anesthesia Procedure Notes (Signed)
Procedure Name: Intubation Date/Time: 02/25/2015 3:46 AM Performed by: Arlice ColtMANESS, Madelin Weseman B Pre-anesthesia Checklist: Patient identified, Emergency Drugs available, Suction available, Patient being monitored and Timeout performed Patient Re-evaluated:Patient Re-evaluated prior to inductionOxygen Delivery Method: Circle system utilized Preoxygenation: Pre-oxygenation with 100% oxygen Intubation Type: IV induction and Rapid sequence Laryngoscope Size: Mac and 3 Grade View: Grade I Tube type: Oral Tube size: 7.5 mm Number of attempts: 1 Airway Equipment and Method: Stylet Placement Confirmation: ETT inserted through vocal cords under direct vision,  positive ETCO2 and breath sounds checked- equal and bilateral Secured at: 21 cm Tube secured with: Tape Dental Injury: Teeth and Oropharynx as per pre-operative assessment

## 2015-02-25 NOTE — Op Note (Signed)
02/25/2015  4:55 AM    Raiford SimmondsHughes, Stacy  [id number]   Pre-Op Dx:  Left entrapped orbital floor fracture  Post-op Dx: Left entrapped orbital floor fracture  Proc: 21386-Left open reduction and repair of left orbital floor fracture, subciliary approach  Surg:  Stacy Moss, Stacy Moss  Anes:  GET  EBL:  Less than 10mL  Comp:  none  Findings:  Left orbital floor fracture with entrapment of orbital fat and inferior rectus completely reduced and orbital floor repaired with absorbable gelfilm plate. No injury to the globe or the optic or infraorbital nerves, post-op forced duction test revealed normal and fully intact left extraocular motion.  Procedure: With the patient in a comfortable supine position, general endotracheal anesthesia was induced without difficulty.    The face was prepped and draped with betadine and the right eye was protected with tape and the left eye was protected with a temporary 4-0 prolene tarsorrhaphy. Betadine was used to prep the entire face and the left infraorbital crease was injected with 1% Xylocaine with 1:100,000 epinephrine. CT scans were reviewed.  An incision was made in the left infraorbital crease through the skin, and then through the orbicularis oculi in a pre-septal fashion in the standard fashion. I then palpated the infraorbital rim and used the Bovie to incise down to the infraorbital rim. A hand-over hand technique was used with the periosteal elevator and suction/malleable retractor to elevate the periosteum/periorbital tissues cranial to the orbital floor all the way back to the orbital apex. I used the Debakey and the periosteal elevator to completely reduce all of the orbital fat and inferior rectus, without injuring these tissues, back into the orbit and out of the fracture. Hemostasis was observed. Once all of the periorbital tissues were reduced and elevated out of the fracture, open reduction and internal fixation of the patient's infraorbital  fracture was performed by cutting an ~ 3x3cm piece of Gelfilm, stacking it with an identically sized additional piece of gelfilm, and placing this absorbable gelfilm plate over the orbital floor bone and inferior to the periorbita/periosteum. Once good reduction was noted with the gelfilm I removed the left tarsorrhaphy and confirmed normal extraocular motions OS with forced duction testing, which was normal. I then closed the orbicularis oculi with interrupted, deep, buried 4-0 vicryl and closed the skin incision with interrupted 4-0 chromic gut mucosal sutures. Good reduction was noted with no rectus muscle injury or entrapment. I dressed the incision with Bacitracin ointment.  The patient was returned to anesthesia, fully awakened and extubated.  The patient was transferred to the postoperative recovery area in stable condition.  Dr. Melvenia BeamMitchell Kattaleya Moss was present and performed the entire procedure.  Plan: cold compresses as needed, head elevation when sleeping for 2 weeks, no nose blowing x 2 weeks and sneeze with mouth open, dress incision with OTC neosporin ointment TID x 2 weeks, sutures are all absorbable. Patient can be discharged home from ENT standpoint. Recheck in the office in 1 to 2 weeks.   Stacy BeamGore, Stacy Moss 4:55 AM 02/25/2015

## 2015-02-25 NOTE — Discharge Instructions (Signed)
Cold compresses to left eye as needed, no nose blowing x 2 weeks and sneeze with mouth open. Dress incision with OTC neosporin TID x 2 weeks, sutures are all absorbable. Follow up with Dr. Emeline DarlingGore in 1 to 2 weeks. Rx given to patient's family member. Elevate head of bed when sleeping.

## 2015-02-25 NOTE — ED Notes (Signed)
Pt to OR.
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
## 2015-02-25 NOTE — ED Notes (Addendum)
Consent for surgery obtained  
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
## 2015-02-26 DIAGNOSIS — Z3A12 12 weeks gestation of pregnancy: Secondary | ICD-10-CM | POA: Diagnosis not present

## 2015-02-26 DIAGNOSIS — R5383 Other fatigue: Secondary | ICD-10-CM | POA: Diagnosis not present

## 2015-02-26 DIAGNOSIS — S0592XA Unspecified injury of left eye and orbit, initial encounter: Secondary | ICD-10-CM | POA: Diagnosis not present

## 2015-02-26 LAB — CBC
HEMATOCRIT: 29.5 % — AB (ref 36.0–46.0)
Hemoglobin: 9.8 g/dL — ABNORMAL LOW (ref 12.0–15.0)
MCH: 29 pg (ref 26.0–34.0)
MCHC: 33.2 g/dL (ref 30.0–36.0)
MCV: 87.3 fL (ref 78.0–100.0)
PLATELETS: 244 10*3/uL (ref 150–400)
RBC: 3.38 MIL/uL — ABNORMAL LOW (ref 3.87–5.11)
RDW: 13.5 % (ref 11.5–15.5)
WBC: 10.5 10*3/uL (ref 4.0–10.5)

## 2015-02-26 LAB — BASIC METABOLIC PANEL
ANION GAP: 8 (ref 5–15)
BUN: 8 mg/dL (ref 6–20)
CALCIUM: 8.6 mg/dL — AB (ref 8.9–10.3)
CO2: 20 mmol/L — AB (ref 22–32)
CREATININE: 0.49 mg/dL (ref 0.44–1.00)
Chloride: 106 mmol/L (ref 101–111)
GFR calc Af Amer: >60 mL/min (ref >60)
GFR calc non Af Amer: >60 mL/min (ref >60)
GLUCOSE: 68 mg/dL — AB (ref 70–99)
Potassium: 3.9 mmol/L (ref 3.5–5.1)
Sodium: 134 mmol/L — ABNORMAL LOW (ref 135–145)

## 2015-02-26 MED ORDER — OXYCODONE-ACETAMINOPHEN 5-325 MG PO TABS: 1.0000 | ORAL_TABLET | Freq: Four times a day (QID) | ORAL | Status: DC | PRN

## 2015-02-26 MED ORDER — CEPHALEXIN 500 MG PO CAPS: 1000.0000 mg | ORAL_CAPSULE | Freq: Three times a day (TID) | ORAL | Status: DC

## 2015-02-26 MED ORDER — BACITRACIN-NEOMYCIN-POLYMYXIN 400-5-5000 EX OINT: TOPICAL_OINTMENT | Freq: Three times a day (TID) | CUTANEOUS | Status: DC

## 2015-02-26 MED ORDER — BACITRACIN-NEOMYCIN-POLYMYXIN 400-5-5000 EX OINT
TOPICAL_OINTMENT | Freq: Three times a day (TID) | CUTANEOUS | Status: DC
  Administered 2015-02-26: 1 via TOPICAL
  Filled 2015-02-26: qty 1

## 2015-02-26 MED ORDER — CEPHALEXIN 500 MG PO CAPS
500.0000 mg | ORAL_CAPSULE | Freq: Three times a day (TID) | ORAL | Status: DC
  Administered 2015-02-26: 500 mg via ORAL
  Filled 2015-02-26: qty 1

## 2015-02-26 MED ORDER — PROMETHAZINE HCL 12.5 MG PO TABS: 12.5000 mg | ORAL_TABLET | Freq: Four times a day (QID) | ORAL | Status: DC | PRN

## 2015-02-26 MED ORDER — IBUPROFEN 600 MG PO TABS: 600.0000 mg | ORAL_TABLET | Freq: Four times a day (QID) | ORAL | Status: DC | PRN
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
## 2015-02-26 NOTE — Progress Notes (Signed)
Discharge information reviewed with patient and pt's significant other. Rx's given and reviewed. Pt asked appropriate questions about care of her eye. Full care of the eye was reviewed along with follow up appointment. Pt receptive and ready for discharge.

## 2015-02-28 ENCOUNTER — Encounter (HOSPITAL_COMMUNITY): Payer: Self-pay | Admitting: Otolaryngology

## 2015-03-09 ENCOUNTER — Telehealth: Payer: Self-pay | Admitting: *Deleted

## 2015-03-09 ENCOUNTER — Ambulatory Visit (INDEPENDENT_AMBULATORY_CARE_PROVIDER_SITE_OTHER): Payer: Medicaid Other | Admitting: Physician Assistant

## 2015-03-09 ENCOUNTER — Other Ambulatory Visit (HOSPITAL_COMMUNITY)
Admission: RE | Admit: 2015-03-09 | Discharge: 2015-03-09 | Disposition: A | Discharge status: Home / Self Care | Payer: Medicaid Other | Source: Ambulatory Visit | Attending: Physician Assistant | Admitting: Physician Assistant

## 2015-03-09 ENCOUNTER — Encounter: Payer: Self-pay | Admitting: Physician Assistant

## 2015-03-09 VITALS — BP 128/75 | HR 80 | Wt 208.0 lb

## 2015-03-09 DIAGNOSIS — Z113 Encounter for screening for infections with a predominantly sexual mode of transmission: Secondary | ICD-10-CM | POA: Diagnosis present

## 2015-03-09 DIAGNOSIS — Z3482 Encounter for supervision of other normal pregnancy, second trimester: Secondary | ICD-10-CM | POA: Diagnosis not present

## 2015-03-09 DIAGNOSIS — R55 Syncope and collapse: Secondary | ICD-10-CM

## 2015-03-09 DIAGNOSIS — O34219 Maternal care for unspecified type scar from previous cesarean delivery: Secondary | ICD-10-CM | POA: Insufficient documentation

## 2015-03-09 DIAGNOSIS — Z01411 Encounter for gynecological examination (general) (routine) with abnormal findings: Secondary | ICD-10-CM | POA: Insufficient documentation

## 2015-03-09 DIAGNOSIS — O0932 Supervision of pregnancy with insufficient antenatal care, second trimester: Secondary | ICD-10-CM

## 2015-03-09 DIAGNOSIS — O3421 Maternal care for scar from previous cesarean delivery: Secondary | ICD-10-CM

## 2015-03-09 DIAGNOSIS — F99 Mental disorder, not otherwise specified: Secondary | ICD-10-CM

## 2015-03-09 DIAGNOSIS — Z8659 Personal history of other mental and behavioral disorders: Secondary | ICD-10-CM

## 2015-03-09 DIAGNOSIS — O99322 Drug use complicating pregnancy, second trimester: Secondary | ICD-10-CM

## 2015-03-09 LAB — POCT URINALYSIS DIP (DEVICE)
Bilirubin Urine: NEGATIVE
Glucose, UA: NEGATIVE mg/dL
Hgb urine dipstick: NEGATIVE
KETONES UR: NEGATIVE mg/dL
Leukocytes, UA: NEGATIVE
NITRITE: NEGATIVE
PH: 6 (ref 5.0–8.0)
PROTEIN: NEGATIVE mg/dL
Specific Gravity, Urine: 1.025 (ref 1.005–1.030)
UROBILINOGEN UA: 1 mg/dL (ref 0.0–1.0)

## 2015-03-09 NOTE — Telephone Encounter (Addendum)
Called pt and left message stating that I am calling with information. Please call back and state whether a detailed message can be left on her voice mail. Pt needs to be informed that [facility] will [facility] her with details of her referral appt once they have scheduled.  1210  Pt was still present in clinic waiting area/lobby. She was informed by Marylynn Pearsonarrie Hillman RN that she will receive a call within the next few days regarding her cardiology referral appt.
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
## 2015-03-09 NOTE — Progress Notes (Signed)
Patient unsure of lmp. Ultrasound scheduled for 5/19

## 2015-03-09 NOTE — Patient Instructions (Signed)
Prenatal Care  WHAT IS PRENATAL CARE?  Prenatal care means health care during your pregnancy, before your baby is born. It is very important to take care of yourself and your baby during your pregnancy by:   Getting early prenatal care. If you know you are pregnant, or think you might be pregnant, call your health care provider as soon as possible. Schedule a visit for a prenatal exam.  Getting regular prenatal care. Follow your health care provider's schedule for blood and other necessary tests. Do not miss appointments.  Doing everything you can to keep yourself and your baby healthy during your pregnancy.  Getting complete care. Prenatal care should include evaluation of the medical, dietary, educational, psychological, and social needs of you and your significant other. The medical and genetic history of your family and the family of your baby's father should be discussed with your health care provider.  Discussing with your health care provider:  Prescription, over-the-counter, and herbal medicines that you take.  Any history of substance abuse, alcohol use, smoking, and illegal drug use.  Any history of domestic abuse and violence.  Immunizations you have received.  Your nutrition and diet.  The amount of exercise you do.  Any environmental and occupational hazards to which you are exposed.  History of sexually transmitted infections for both you and your partner.  Previous pregnancies you have had. WHY IS PRENATAL CARE SO IMPORTANT?  By regularly seeing your health care provider, you help ensure that problems can be identified early so that they can be treated as soon as possible. Other problems might be prevented. Many studies have shown that early and regular prenatal care is important for the health of mothers and their babies.  HOW CAN I TAKE CARE OF MYSELF WHILE I AM PREGNANT?  Here are ways to take care of yourself and your baby:   Start or continue taking your  multivitamin with 400 micrograms (mcg) of folic acid every day.  Get early and regular prenatal care. It is very important to see a health care provider during your pregnancy. Your health care provider will check at each visit to make sure that you and your baby are healthy. If there are any problems, action can be taken right away to help you and your baby.  Eat a healthy diet that includes:  Fruits.  Vegetables.  Foods low in saturated fat.  Whole grains.  Calcium-rich foods, such as milk, yogurt, and hard cheeses.  Drink 6-8 glasses of liquids a day.  Unless your health care provider tells you not to, try to be physically active for 30 minutes, most days of the week. If you are pressed for time, you can get your activity in through 10-minute segments, three times a day.  Do not smoke, drink alcohol, or use drugs. These can cause long-term damage to your baby. Talk with your health care provider about steps to take to stop smoking. Talk with a member of your faith community, a counselor, a trusted friend, or your health care provider if you are concerned about your alcohol or drug use.  Ask your health care provider before taking any medicine, even over-the-counter medicines. Some medicines are not safe to take during pregnancy.  Get plenty of rest and sleep.  Avoid hot tubs and saunas during pregnancy.  Do not have X-rays taken unless absolutely necessary and with the recommendation of your health care provider. A lead shield can be placed on your abdomen to protect your baby when   X-rays are taken in other parts of your body.  Do not empty the cat litter when you are pregnant. It may contain a parasite that causes an infection called toxoplasmosis, which can cause birth defects. Also, use gloves when working in garden areas used by cats.  Do not eat uncooked or undercooked meats or fish.  Do not eat soft, mold-ripened cheeses (Brie, Camembert, and chevre) or soft, blue-veined  cheese (Danish blue and Roquefort).  Stay away from toxic chemicals like:  Insecticides.  Solvents (some cleaners or paint thinners).  Lead.  Mercury.  Sexual intercourse may continue until the end of the pregnancy, unless you have a medical problem or there is a problem with the pregnancy and your health care provider tells you not to.  Do not wear high-heel shoes, especially during the second half of the pregnancy. You can lose your balance and fall.  Do not take long trips, unless absolutely necessary. Be sure to see your health care provider before going on the trip.  Do not sit in one position for more than 2 hours when on a trip.  Take a copy of your medical records when going on a trip. Know where a hospital is located in the city you are visiting, in case of an emergency.  Most dangerous household products will have pregnancy warnings on their labels. Ask your health care provider about products if you are unsure.  Limit or eliminate your caffeine intake from coffee, tea, sodas, medicines, and chocolate.  Many women continue working through pregnancy. Staying active might help you stay healthier. If you have a question about the safety or the hours you work at your particular job, talk with your health care provider.  Get informed:  Read books.  Watch videos.  Go to childbirth classes for you and your significant other.  Talk with experienced moms.  Ask your health care provider about childbirth education classes for you and your partner. Classes can help you and your partner prepare for the birth of your baby.  Ask about a baby doctor (pediatrician) and methods and pain medicine for labor, delivery, and possible cesarean delivery. HOW OFTEN SHOULD I SEE MY HEALTH CARE PROVIDER DURING PREGNANCY?  Your health care provider will give you a schedule for your prenatal visits. You will have visits more often as you get closer to the end of your pregnancy. An average  pregnancy lasts about 40 weeks.  A typical schedule includes visiting your health care provider:   About once each month during your first 6 months of pregnancy.  Every 2 weeks during the next 2 months.  Weekly in the last month, until the delivery date. Your health care provider will probably want to see you more often if:  You are older than 35 years.  Your pregnancy is high risk because you have certain health problems or problems with the pregnancy, such as:  Diabetes.  High blood pressure.  The baby is not growing on schedule, according to the dates of the pregnancy. Your health care provider will do special tests to make sure you and your baby are not having any serious problems. WHAT HAPPENS DURING PRENATAL VISITS?   At your first prenatal visit, your health care provider will do a physical exam and talk to you about your health history and the health history of your partner and your family. Your health care provider will be able to tell you what date to expect your baby to be born on.  Your   first physical exam will include checks of your blood pressure, measurements of your height and weight, and an exam of your pelvic organs. Your health care provider will do a Pap test if you have not had one recently and will do cultures of your cervix to make sure there is no infection.  At each prenatal visit, there will be tests of your blood, urine, blood pressure, weight, and the progress of the baby will be checked.  At your later prenatal visits, your health care provider will check how you are doing and how your baby is developing. You may have a number of tests done as your pregnancy progresses.  Ultrasound exams are often used to check on your baby's growth and health.  You may have more urine and blood tests, as well as special tests, if needed. These may include amniocentesis to examine fluid in the pregnancy sac, stress tests to check how the baby responds to contractions, or a  biophysical profile to measure your baby's well-being. Your health care provider will explain the tests and why they are necessary.  You should be tested for high blood sugar (gestational diabetes) between the 24th and 28th weeks of your pregnancy.  You should discuss with your health care provider your plans to breastfeed or bottle-feed your baby.  Each visit is also a chance for you to learn about staying healthy during pregnancy and to ask questions. Document Released: 10/11/2003 Document Revised: 10/13/2013 Document Reviewed: 12/23/2013 Exit[employer] Patient Information 2015 [employer]. This information is not intended to replace advice given to you by your health care provider. Make sure you discuss any questions you have with your health care provider.  

## 2015-03-10 ENCOUNTER — Ambulatory Visit (HOSPITAL_COMMUNITY)
Admission: RE | Admit: 2015-03-10 | Discharge: 2015-03-10 | Disposition: A | Discharge status: Home / Self Care | Payer: Medicaid Other | Source: Ambulatory Visit | Attending: Physician Assistant | Admitting: Physician Assistant

## 2015-03-10 ENCOUNTER — Other Ambulatory Visit: Payer: Self-pay | Admitting: General Practice

## 2015-03-10 DIAGNOSIS — O0932 Supervision of pregnancy with insufficient antenatal care, second trimester: Secondary | ICD-10-CM | POA: Diagnosis present

## 2015-03-10 DIAGNOSIS — Z3A15 15 weeks gestation of pregnancy: Secondary | ICD-10-CM | POA: Insufficient documentation

## 2015-03-10 DIAGNOSIS — Z3689 Encounter for other specified antenatal screening: Secondary | ICD-10-CM | POA: Insufficient documentation

## 2015-03-10 LAB — PRENATAL PROFILE (SOLSTAS)
Antibody Screen: NEGATIVE
Basophils Absolute: 0 10*3/uL (ref 0.0–0.1)
Basophils Relative: 0 % (ref 0–1)
EOS ABS: 0.1 10*3/uL (ref 0.0–0.7)
Eosinophils Relative: 1 % (ref 0–5)
HCT: 32.2 % — ABNORMAL LOW (ref 36.0–46.0)
HEMOGLOBIN: 10.6 g/dL — AB (ref 12.0–15.0)
HIV: NONREACTIVE
Hepatitis B Surface Ag: NEGATIVE
LYMPHS ABS: 2 10*3/uL (ref 0.7–4.0)
Lymphocytes Relative: 20 % (ref 12–46)
MCH: 29 pg (ref 26.0–34.0)
MCHC: 32.9 g/dL (ref 30.0–36.0)
MCV: 88.2 fL (ref 78.0–100.0)
MPV: 9.4 fL (ref 8.6–12.4)
Monocytes Absolute: 0.6 10*3/uL (ref 0.1–1.0)
Monocytes Relative: 6 % (ref 3–12)
NEUTROS ABS: 7.2 10*3/uL (ref 1.7–7.7)
Neutrophils Relative %: 73 % (ref 43–77)
Platelets: 303 10*3/uL (ref 150–400)
RBC: 3.65 MIL/uL — ABNORMAL LOW (ref 3.87–5.11)
RDW: 14 % (ref 11.5–15.5)
RUBELLA: 7.14 {index} — AB (ref <0.90)
Rh Type: POSITIVE
WBC: 9.9 10*3/uL (ref 4.0–10.5)

## 2015-03-10 LAB — WET PREP, GENITAL
CLUE CELLS WET PREP: NONE SEEN
Trich, Wet Prep: NONE SEEN
WBC, Wet Prep HPF POC: NONE SEEN
YEAST WET PREP: NONE SEEN

## 2015-03-10 LAB — AFP, QUAD SCREEN
AFP: 43.5 ng/mL
Age Alone: 1:1150 {titer}
Curr Gest Age: 19.5 wks.days
Down Syndrome Scr Risk Est: 1:676 {titer}
HCG TOTAL: 26.57 [IU]/mL
INH: 148.8 pg/mL
Interpretation-AFP: NEGATIVE
MOM FOR AFP: 0.87
MOM FOR HCG: 1.44
MoM for INH: 0.98
OPEN SPINA BIFIDA: NEGATIVE
Osb Risk: 1:41900 {titer}
TRI 18 SCR RISK EST: NEGATIVE
Trisomy 18 (Edward) Syndrome Interp.: 1:1440 {titer}
UE3 MOM: 0.28
uE3 Value: 0.45 ng/mL

## 2015-03-10 LAB — GLUCOSE TOLERANCE, 1 HOUR (50G) W/O FASTING: GLUCOSE 1 HOUR GTT: 45 mg/dL — AB (ref 70–140)

## 2015-03-10 LAB — CYTOLOGY - PAP

## 2015-03-11 LAB — CULTURE, OB URINE
COLONY COUNT: NO GROWTH
Organism ID, Bacteria: NO GROWTH

## 2015-03-11 LAB — HEMOGLOBINOPATHY EVALUATION
HGB A2 QUANT: 2.9 % (ref 2.2–3.2)
HGB F QUANT: 0 % (ref 0.0–2.0)
Hemoglobin Other: 0 %
Hgb A: 97.1 % (ref 96.8–97.8)
Hgb S Quant: 0 %

## 2015-03-14 ENCOUNTER — Ambulatory Visit (INDEPENDENT_AMBULATORY_CARE_PROVIDER_SITE_OTHER): Payer: Medicaid Other | Admitting: Cardiology

## 2015-03-14 ENCOUNTER — Encounter: Payer: Self-pay | Admitting: Cardiology

## 2015-03-14 VITALS — BP 118/62 | HR 92 | Ht 69.0 in | Wt 210.6 lb

## 2015-03-14 DIAGNOSIS — R55 Syncope and collapse: Secondary | ICD-10-CM | POA: Diagnosis not present

## 2015-03-14 DIAGNOSIS — R0602 Shortness of breath: Secondary | ICD-10-CM | POA: Diagnosis not present

## 2015-03-14 DIAGNOSIS — R002 Palpitations: Secondary | ICD-10-CM | POA: Insufficient documentation

## 2015-03-14 DIAGNOSIS — R079 Chest pain, unspecified: Secondary | ICD-10-CM

## 2015-03-14 LAB — OXYCODONE, URINE (LC/MS-MS)
NOROXYCODONE, UR: 199 ng/mL — AB (ref <50)
OXYCODONE, UR: NEGATIVE ng/mL (ref <50)
Oxymorphone: 387 ng/mL — AB (ref <50)

## 2015-03-14 LAB — CANNABANOIDS (GC/LC/MS), URINE: THC-COOH UR CONFIRM: 89 ng/mL — AB (ref <5)

## 2015-03-14 NOTE — Patient Instructions (Signed)
Medication Instructions:  Your physician recommends that you continue on your current medications as directed. Please refer to the Current Medication list given to you today.   Labwork: None  Testing/Procedures: Your physician has requested that you have an echocardiogram. Echocardiography is a painless test that uses sound waves to create images of your heart. It provides your doctor with information about the size and shape of your heart and how well your heart's chambers and valves are working. This procedure takes approximately one hour. There are no restrictions for this procedure.  Your physician has recommended that you wear an event monitor. Event monitors are medical devices that record the heart's electrical activity. Doctors most often us these monitors to diagnose arrhythmias. Arrhythmias are problems with the speed or rhythm of the heartbeat. The monitor is a small, portable device. You can wear one while you do your normal daily activities. This is usually used to diagnose what is causing palpitations/syncope (passing out).  Follow-Up: You have a follow-up appointment scheduled with Dr. Mayford Knifeurner April 18, 2015 at 2:30 PM.  Any Other Special Instructions Will Be Listed Below (If Applicable).
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
## 2015-03-15 DIAGNOSIS — O99322 Drug use complicating pregnancy, second trimester: Secondary | ICD-10-CM | POA: Insufficient documentation

## 2015-03-15 DIAGNOSIS — F99 Mental disorder, not otherwise specified: Secondary | ICD-10-CM | POA: Insufficient documentation

## 2015-03-15 DIAGNOSIS — Z8659 Personal history of other mental and behavioral disorders: Secondary | ICD-10-CM | POA: Insufficient documentation

## 2015-03-15 LAB — PRESCRIPTION MONITORING PROFILE (19 PANEL)
Amphetamine/Meth: NEGATIVE ng/mL
BUPRENORPHINE, URINE: NEGATIVE ng/mL
Barbiturate Screen, Urine: NEGATIVE ng/mL
Benzodiazepine Screen, Urine: NEGATIVE ng/mL
COCAINE METABOLITES: NEGATIVE ng/mL
Carisoprodol, Urine: NEGATIVE ng/mL
Creatinine, Urine: 198.92 mg/dL (ref >20.0)
Fentanyl, Ur: NEGATIVE ng/mL
MDMA URINE: NEGATIVE ng/mL
MEPERIDINE UR: NEGATIVE ng/mL
METHADONE SCREEN, URINE: NEGATIVE ng/mL
Methaqualone: NEGATIVE ng/mL
Nitrites, Initial: NEGATIVE ug/mL
Opiate Screen, Urine: NEGATIVE ng/mL
PH URINE, INITIAL: 6.3 pH (ref 4.5–8.9)
PHENCYCLIDINE, UR: NEGATIVE ng/mL
PROPOXYPHENE: NEGATIVE ng/mL
TAPENTADOLUR: NEGATIVE ng/mL
TRAMADOL UR: NEGATIVE ng/mL
Zolpidem, Urine: NEGATIVE ng/mL

## 2015-03-17 ENCOUNTER — Telehealth: Payer: Self-pay | Admitting: General Practice

## 2015-03-17 NOTE — Telephone Encounter (Signed)
Patient called and left message stating she is returning a call she missed from us, if she doesn't answer we can leave a detailed message on her voicemail.

## 2015-03-24 ENCOUNTER — Emergency Department (HOSPITAL_COMMUNITY)
Admission: EM | Admit: 2015-03-24 | Discharge: 2015-03-24 | Discharge status: Against Medical Advice | Payer: Medicaid Other | Attending: Emergency Medicine | Admitting: Emergency Medicine

## 2015-03-24 ENCOUNTER — Encounter (HOSPITAL_COMMUNITY): Payer: Self-pay

## 2015-03-24 DIAGNOSIS — Z720 Tobacco use: Secondary | ICD-10-CM | POA: Diagnosis not present

## 2015-03-24 DIAGNOSIS — O9989 Other specified diseases and conditions complicating pregnancy, childbirth and the puerperium: Secondary | ICD-10-CM | POA: Insufficient documentation

## 2015-03-24 DIAGNOSIS — H9201 Otalgia, right ear: Secondary | ICD-10-CM | POA: Diagnosis not present

## 2015-03-24 DIAGNOSIS — I10 Essential (primary) hypertension: Secondary | ICD-10-CM | POA: Diagnosis not present

## 2015-03-24 DIAGNOSIS — Z3A18 18 weeks gestation of pregnancy: Secondary | ICD-10-CM | POA: Diagnosis not present

## 2015-03-24 NOTE — ED Notes (Signed)
Pt told primary nurse for her child that she needed to get home and told registration at check in that she was leaving since she needed to get her child home.  Attempted to encourage mom to let dad stay with baby while he was seen on the pediatric side and have mom go to the adult side at the time when she checked in, but pt refused.  Pt was seen leaving department after her baby was discharged.

## 2015-03-24 NOTE — ED Notes (Signed)
Called pt for room pt did not answer Peds RN stated pt left

## 2015-03-24 NOTE — ED Notes (Signed)
Pt told registration she was leaving.  

## 2015-03-24 NOTE — ED Notes (Signed)
Pt c/o right ear pain for a week with congestion, she is also approximately [redacted] weeks pregnant, has been trying tylenol and ASA for pain without relief.  Pt advised to stop ASA due to pregnancy.

## 2015-03-25 ENCOUNTER — Emergency Department (HOSPITAL_COMMUNITY)
Admission: EM | Admit: 2015-03-25 | Discharge: 2015-03-25 | Disposition: A | Discharge status: Home / Self Care | Payer: Medicaid Other | Attending: Emergency Medicine | Admitting: Emergency Medicine

## 2015-03-25 ENCOUNTER — Encounter (HOSPITAL_COMMUNITY): Payer: Self-pay | Admitting: Emergency Medicine

## 2015-03-25 DIAGNOSIS — O99712 Diseases of the skin and subcutaneous tissue complicating pregnancy, second trimester: Secondary | ICD-10-CM | POA: Diagnosis not present

## 2015-03-25 DIAGNOSIS — L03311 Cellulitis of abdominal wall: Secondary | ICD-10-CM | POA: Insufficient documentation

## 2015-03-25 DIAGNOSIS — O10012 Pre-existing essential hypertension complicating pregnancy, second trimester: Secondary | ICD-10-CM | POA: Diagnosis not present

## 2015-03-25 DIAGNOSIS — Z3A18 18 weeks gestation of pregnancy: Secondary | ICD-10-CM | POA: Diagnosis not present

## 2015-03-25 DIAGNOSIS — O9989 Other specified diseases and conditions complicating pregnancy, childbirth and the puerperium: Secondary | ICD-10-CM | POA: Diagnosis not present

## 2015-03-25 DIAGNOSIS — H00016 Hordeolum externum left eye, unspecified eyelid: Secondary | ICD-10-CM

## 2015-03-25 DIAGNOSIS — H00014 Hordeolum externum left upper eyelid: Secondary | ICD-10-CM | POA: Diagnosis not present

## 2015-03-25 DIAGNOSIS — H6092 Unspecified otitis externa, left ear: Secondary | ICD-10-CM | POA: Insufficient documentation

## 2015-03-25 DIAGNOSIS — Z79899 Other long term (current) drug therapy: Secondary | ICD-10-CM | POA: Diagnosis not present

## 2015-03-25 DIAGNOSIS — Z8659 Personal history of other mental and behavioral disorders: Secondary | ICD-10-CM | POA: Diagnosis not present

## 2015-03-25 DIAGNOSIS — O99332 Smoking (tobacco) complicating pregnancy, second trimester: Secondary | ICD-10-CM | POA: Diagnosis not present

## 2015-03-25 DIAGNOSIS — F17210 Nicotine dependence, cigarettes, uncomplicated: Secondary | ICD-10-CM | POA: Insufficient documentation

## 2015-03-25 MED ORDER — NEOMYCIN-POLYMYXIN-HC 3.5-10000-1 OT SUSP: 4.0000 [drp] | Freq: Three times a day (TID) | OTIC | Status: DC

## 2015-03-25 MED ORDER — CEPHALEXIN 500 MG PO CAPS: 500.0000 mg | ORAL_CAPSULE | Freq: Two times a day (BID) | ORAL | Status: DC

## 2015-03-25 MED ORDER — NEOMYCIN-COLIST-HC-THONZONIUM 3.3-3-10-0.5 MG/ML OT SUSP: 4.0000 [drp] | Freq: Three times a day (TID) | OTIC | Status: DC

## 2015-03-25 MED ORDER — ACETAMINOPHEN 325 MG PO TABS
325.0000 mg | ORAL_TABLET | Freq: Once | ORAL | Status: AC
  Administered 2015-03-25: 325 mg via ORAL
  Filled 2015-03-25: qty 1

## 2015-03-25 MED ORDER — CEPHALEXIN 250 MG PO CAPS
500.0000 mg | ORAL_CAPSULE | Freq: Once | ORAL | Status: AC
  Administered 2015-03-25: 500 mg via ORAL
  Filled 2015-03-25: qty 2

## 2015-03-25 MED ORDER — NEOMYCIN-POLYMYXIN-HC 3.5-10000-1 OT SUSP
4.0000 [drp] | Freq: Three times a day (TID) | OTIC | Status: DC
  Administered 2015-03-25: 4 [drp] via OTIC
  Filled 2015-03-25: qty 10

## 2015-03-25 NOTE — ED Notes (Signed)
Pt. reports right earache with malodorous drainage onset 2 weeks ago , denies injury / no fever or chills.

## 2015-03-25 NOTE — ED Provider Notes (Signed)
CSN: [id number]     Arrival date & time 03/25/15  0127 History   None    This chart was scribed for Marisa Severin, MD by Arlan Organ, ED Scribe. This patient was seen in room A10C/A10C and the patient's care was started 3:37 AM.   Chief Complaint  Patient presents with  . Otalgia   The history is provided by the patient. No language interpreter was used.    HPI Comments: MCKYLA DECKMAN, [id number] currently [redacted] weeks gestation is a 24 y.o. female with a PMHx of HTN who presents to the Emergency Department complaining of constant, ongoing L sided eye pain with drainage x 2 weeks. PSHx includes eye surgery 3-4 weeks ago with symptoms presenting shortly after. Ms. Phagan has attempted antibiotic eye ointment prescribed after surgery without any noticeable improvement. Pt also reports constant, ongoing malodorous drainage from the R ear along associated ear pain x over 2 weeks. No recent injury or trauma to ear. OTC ASA and Tylenol attempted prior to arrival without any improvement for symptoms. No recent fever or chills. Pt is currently followed by an eye doctor and is due for follow up shortly. No known allergies to medications.  Past Medical History  Diagnosis Date  . Hx of tonsillectomy   . S/P cesarean section 05/25/2012  . Seizures     age [age]- after fell and hit head  . Hypertension     on meds 2011  . Anxiety   . Mental disorder   . Depression   . Bipolar affective   . ADD (attention deficit disorder)    Past Surgical History  Procedure Laterality Date  . Tonsillectomy    . Cesarean section  05/25/2012    Procedure: CESAREAN SECTION;  Surgeon: Sherron Monday, MD;  Location: [facility];  Service: Gynecology;  Laterality: N/A;  Primary cesarean section with delivery of baby girl at 0700. Apgars9/9.  Marland Kitchen Orif orbital fracture Left 02/25/2015    Procedure: OPEN REDUCTION INTERNAL FIXATION (ORIF) LEFT ORBITAL FRACTURE;  Surgeon: Melvenia Beam, MD;  Location: [facility] OR;  Service: ENT;  Laterality: Left;    Family History  Problem Relation Age of Onset  . Anesthesia problems Neg Hx   . Cancer Maternal Grandmother   . Hypertension Maternal Grandmother   . Hypertension Mother    History  Substance Use Topics  . Smoking status: Current Every Day Smoker -- 0.00 packs/day for 0 years    Types: Cigarettes  . Smokeless tobacco: Never Used  . Alcohol Use: No   OB History    Gravida Para Term Preterm AB TAB SAB Ectopic Multiple Living   0 2 0 2 0 0 2     Review of Systems  Constitutional: Negative for fever and chills.  HENT: Positive for ear pain.   Eyes: Positive for pain and discharge.  Respiratory: Negative for shortness of breath.   Cardiovascular: Negative for chest pain.  Gastrointestinal: Negative for nausea and vomiting.  Skin: Positive for wound. Negative for rash.  Psychiatric/Behavioral: Negative for confusion.      Allergies  Review of patient's allergies indicates no known allergies.  Home Medications   Prior to Admission medications   Medication Sig Start Date End Date Taking? Authorizing Provider  ibuprofen (ADVIL,MOTRIN) 600 MG tablet Take 1 tablet (600 mg total) by mouth every 6 (six) hours as needed for mild pain. 02/26/15   Ripudeep Jenna Luo, MD  neomycin-bacitracin-polymyxin (NEOSPORIN) ointment Apply topically 3 (three) times daily. apply to  sutures for a week 02/26/15   Ripudeep Jenna Luo, MD  oxyCODONE-acetaminophen (PERCOCET/ROXICET) 5-325 MG per tablet Take 1 tablet by mouth every 6 (six) hours as needed for severe pain. 02/26/15   Ripudeep Jenna Luo, MD  prenatal vitamin w/FE, FA (PRENATAL 1 + 1) 27-1 MG TABS tablet Take 1 tablet by mouth daily at 12 noon.    Historical Provider, MD  promethazine (PHENERGAN) 12.5 MG tablet Take 1 tablet (12.5 mg total) by mouth every 6 (six) hours as needed for nausea or vomiting. 02/26/15   Ripudeep Jenna Luo, MD   Triage Vitals: BP 143/83 mmHg  Pulse 94  Temp(Src) 97.8 F (36.6 C) (Oral)  Resp 16  SpO2 99%  LMP 10/22/2014  (Approximate)   Physical Exam  Constitutional: She is oriented to person, place, and time. She appears well-developed and well-nourished.  HENT:  Head: Normocephalic and atraumatic.  Left Ear: External ear normal.  Nose: Nose normal.  Mouth/Throat: Oropharynx is clear and moist.  Right ear canal with edema, drainage in the ear canal.  TM is normal.  Eyes: Conjunctivae and EOM are normal. Pupils are equal, round, and reactive to light. Left eye exhibits discharge.  Patient has nodule to left upper middle eyelid consistent with a stye.  Patient has drainage from the left eye.  She has some intraoral pre-and of the eyelashes of the lower lid.  Suture site below the left eye appear to be well-healed.  Neck: Normal range of motion. Neck supple. No JVD present. No tracheal deviation present. No thyromegaly present.  Cardiovascular: Normal rate, regular rhythm, normal heart sounds and intact distal pulses.  Exam reveals no gallop and no friction rub.   No murmur heard. Pulmonary/Chest: Effort normal and breath sounds normal. No stridor. No respiratory distress. She has no wheezes. She has no rales. She exhibits no tenderness.  Abdominal: Soft. Bowel sounds are normal. She exhibits no distension and no mass. There is no tenderness. There is no rebound and no guarding.  Musculoskeletal: Normal range of motion. She exhibits no edema or tenderness.  Lymphadenopathy:    She has no cervical adenopathy.  Neurological: She is alert and oriented to person, place, and time. She displays normal reflexes. She exhibits normal muscle tone. Coordination normal.  Skin: Skin is warm and dry. No rash noted. No erythema. No pallor.  Patient with area of erythema to right lower abdomen.  There is a center punctum.  There is no fluctuance, mild induration  Psychiatric: She has a normal mood and affect. Her behavior is normal. Judgment and thought content normal.  Nursing note and vitals reviewed.   ED Course   Procedures (including critical care time)  DIAGNOSTIC STUDIES: Oxygen Saturation is 99% on RA, Normal by my interpretation.    COORDINATION OF CARE: 3:40 AM- Will give Tylenol, Keflex, and Cortisporin TC. Discussed treatment plan with pt at bedside and pt agreed to plan.     Labs Review Labs Reviewed - No data to display  Imaging Review No results found.   EKG Interpretation None      MDM   Final diagnoses:  Hordeolum externum (stye), left  Otitis externa of left ear  Cellulitis of right abdominal wall    24 year old female [redacted] weeks pregnant who presents with multiple complaints.  She complains of right ear pain with drainage 1 week, but bite to right lower abdomen with redness and pain for 4 days, and swelling to left upper eyelid with drainage.  Patient recently with surgery  under the left eye, has follow-up with ophthalmology pending.  Right abdomen with what appears to be a bug bite with some surrounding cellulitis.  There is no focal abscess to drain at this time.  Right ear noted to have swelling of the ear canal without perforation of the TM.  There does appear to be some purulence consistent with otitis external.  Will start on anabolic drops for the ear.  Patient is to continue warm compresses to the eye to the abdomen.  Will start her on Keflex.  Patient told to follow-up with ophthalmology as she does have some inward turning of her lower eyelids which may be contributing to her eye symptoms.  I personally performed the services described in this documentation, which was scribed in my presence. The recorded information has been reviewed and is accurate.    Marisa Severinlga Charley Miske, MD 03/25/15 [id number]

## 2015-03-25 NOTE — Discharge Instructions (Signed)
Use medications as prescribed.  Follow up with the eye doctor today for recheck of your eye.  No aspirin, ibuprofen, motrin, naprosyn, aleve during pregnancy.   Cellulitis Cellulitis is an infection of the skin and the tissue under the skin. The infected area is usually red and tender. This happens most often in the arms and lower legs. HOME CARE   Take your antibiotic medicine as told. Finish the medicine even if you start to feel better.  Keep the infected arm or leg raised (elevated).  Put a warm cloth on the area up to 4 times per day.  Only take medicines as told by your doctor.  Keep all doctor visits as told. GET HELP IF:  You see red streaks on the skin coming from the infected area.  Your red area gets bigger or turns a dark color.  Your bone or joint under the infected area is painful after the skin heals.  Your infection comes back in the same area or different area.  You have a puffy (swollen) bump in the infected area.  You have new symptoms.  You have a fever. GET HELP RIGHT AWAY IF:   You feel very sleepy.  You throw up (vomit) or have watery poop (diarrhea).  You feel sick and have muscle aches and pains. MAKE SURE YOU:   Understand these instructions.  Will watch your condition.  Will get help right away if you are not doing well or get worse. Document Released: 03/26/2008 Document Revised: 02/22/2014 Document Reviewed: 12/24/2011 [facility] Patient Information 2015 [place], [place]. This information is not intended to replace advice given to you by your health care provider. Make sure you discuss any questions you have with your health care provider.  Ear Drops You have been diagnosed with a condition requiring you to put drops of medicine into your outer ear. HOME CARE INSTRUCTIONS   Put drops in the affected ear as instructed. After putting the drops in, you will need to lie down with the affected ear facing up for ten minutes so the drops will  remain in the ear canal and run down and fill the canal. Continue using the ear drops for as long as directed by your health care provider.  Prior to getting up, put a cotton ball gently in your ear canal. Leave enough of the cotton ball out so it can be easily removed. Do not attempt to push this down into the canal with a cotton-tipped swab or other instrument.  Do not irrigate or wash out your ears if you have had a perforated eardrum or mastoid surgery, or unless instructed to do so by your health care provider.  Keep appointments with your health care provider as instructed.  Finish all medicine, or use for the length of time prescribed by your health care provider. Continue the drops even if your problem seems to be doing well after a couple days, or continue as instructed. SEEK MEDICAL CARE IF:  You become worse or develop increasing pain.  You notice any unusual drainage from your ear (particularly if the drainage has a bad smell).  You develop hearing difficulties.  You experience a serious form of dizziness in which you feel as if the room is spinning, and you feel nauseated (vertigo).  The outside of your ear becomes red or swollen or both. This may be a sign of an allergic reaction. MAKE SURE YOU:   Understand these instructions.  Will watch your condition.  Will get help right  away if you are not doing well or get worse. Document Released: 10/02/2001 Document Revised: 10/13/2013 Document Reviewed: 05/05/2013 [facility] Patient Information 2015 [place], [place]. This information is not intended to replace advice given to you by your health care provider. Make sure you discuss any questions you have with your health care provider.  Otitis Externa Otitis externa is a bacterial or fungal infection of the outer ear canal. This is the area from the eardrum to the outside of the ear. Otitis externa is sometimes called "swimmer's ear." CAUSES  Possible causes of infection  include:  Swimming in dirty water.  Moisture remaining in the ear after swimming or bathing.  Mild injury (trauma) to the ear.  Objects stuck in the ear (foreign body).  Cuts or scrapes (abrasions) on the outside of the ear. SIGNS AND SYMPTOMS  The first symptom of infection is often itching in the ear canal. Later signs and symptoms may include swelling and redness of the ear canal, ear pain, and yellowish-white fluid (pus) coming from the ear. The ear pain may be worse when pulling on the earlobe. DIAGNOSIS  Your health care provider will perform a physical exam. A sample of fluid may be taken from the ear and examined for bacteria or fungi. TREATMENT  Antibiotic ear drops are often given for 10 to 14 days. Treatment may also include pain medicine or corticosteroids to reduce itching and swelling. HOME CARE INSTRUCTIONS   Apply antibiotic ear drops to the ear canal as prescribed by your health care provider.  Take medicines only as directed by your health care provider.  If you have diabetes, follow any additional treatment instructions from your health care provider.  Keep all follow-up visits as directed by your health care provider. PREVENTION   Keep your ear dry. Use the corner of a towel to absorb water out of the ear canal after swimming or bathing.  Avoid scratching or putting objects inside your ear. This can damage the ear canal or remove the protective wax that lines the canal. This makes it easier for bacteria and fungi to grow.  Avoid swimming in lakes, polluted water, or poorly chlorinated pools.  You may use ear drops made of rubbing alcohol and vinegar after swimming. Combine equal parts of white vinegar and alcohol in a bottle. Put 3 or 4 drops into each ear after swimming. SEEK MEDICAL CARE IF:   You have a fever.  Your ear is still red, swollen, painful, or draining pus after 3 days.  Your redness, swelling, or pain gets worse.  You have a severe  headache.  You have redness, swelling, pain, or tenderness in the area behind your ear. MAKE SURE YOU:   Understand these instructions.  Will watch your condition.  Will get help right away if you are not doing well or get worse. Document Released: 10/08/2005 Document Revised: 02/22/2014 Document Reviewed: 10/25/2011 [facility] Patient Information 2015 [place], [place]. This information is not intended to replace advice given to you by your health care provider. Make sure you discuss any questions you have with your health care provider.  Sty A sty (hordeolum) is an infection of a gland in the eyelid located at the base of the eyelash. A sty may develop a white or yellow head of pus. It can be puffy (swollen). Usually, the sty will burst and pus will come out on its own. They do not leave lumps in the eyelid once they drain. A sty is often confused with another form of  cyst of the eyelid called a chalazion. Chalazions occur within the eyelid and not on the edge where the bases of the eyelashes are. They often are red, sore and then form firm lumps in the eyelid. CAUSES   Germs (bacteria).  Lasting (chronic) eyelid inflammation. SYMPTOMS   Tenderness, redness and swelling along the edge of the eyelid at the base of the eyelashes.  Sometimes, there is a white or yellow head of pus. It may or may not drain. DIAGNOSIS  An ophthalmologist will be able to distinguish between a sty and a chalazion and treat the condition appropriately.  TREATMENT   Styes are typically treated with warm packs (compresses) until drainage occurs.  In rare cases, medicines that kill germs (antibiotics) may be prescribed. These antibiotics may be in the form of drops, cream or pills.  If a hard lump has formed, it is generally necessary to do a small incision and remove the hardened contents of the cyst in a minor surgical procedure done in the office.  In suspicious cases, your caregiver may send the contents  of the cyst to the lab to be certain that it is not a rare, but dangerous form of cancer of the glands of the eyelid. HOME CARE INSTRUCTIONS   Wash your hands often and dry them with a clean towel. Avoid touching your eyelid. This may spread the infection to other parts of the eye.  Apply heat to your eyelid for 10 to 20 minutes, several times a day, to ease pain and help to heal it faster.  Do not squeeze the sty. Allow it to drain on its own. Wash your eyelid carefully 3 to 4 times per day to remove any pus. SEEK IMMEDIATE MEDICAL CARE IF:   Your eye becomes painful or puffy (swollen).  Your vision changes.  Your sty does not drain by itself within 3 days.  Your sty comes back within a short period of time, even with treatment.  You have redness (inflammation) around the eye.  You have a fever. Document Released: 07/18/2005 Document Revised: 12/31/2011 Document Reviewed: 01/22/2014 [facility] Patient Information 2015 [place]. This information is not intended to replace advice given to you by your health care provider. Make sure you discuss any questions you have with your health care provider.

## 2015-03-28 ENCOUNTER — Other Ambulatory Visit (HOSPITAL_COMMUNITY): Payer: Medicaid Other

## 2015-04-06 ENCOUNTER — Ambulatory Visit (INDEPENDENT_AMBULATORY_CARE_PROVIDER_SITE_OTHER): Payer: Medicaid Other | Admitting: Family

## 2015-04-06 ENCOUNTER — Encounter: Payer: Self-pay | Admitting: Family

## 2015-04-06 ENCOUNTER — Other Ambulatory Visit: Payer: Self-pay | Admitting: Family

## 2015-04-06 VITALS — BP 125/71 | HR 81 | Temp 98.4°F | Wt 208.4 lb

## 2015-04-06 DIAGNOSIS — O34219 Maternal care for unspecified type scar from previous cesarean delivery: Secondary | ICD-10-CM

## 2015-04-06 DIAGNOSIS — O26892 Other specified pregnancy related conditions, second trimester: Secondary | ICD-10-CM

## 2015-04-06 DIAGNOSIS — N898 Other specified noninflammatory disorders of vagina: Secondary | ICD-10-CM

## 2015-04-06 DIAGNOSIS — O09899 Supervision of other high risk pregnancies, unspecified trimester: Secondary | ICD-10-CM

## 2015-04-06 DIAGNOSIS — Z3482 Encounter for supervision of other normal pregnancy, second trimester: Secondary | ICD-10-CM

## 2015-04-06 DIAGNOSIS — O3421 Maternal care for scar from previous cesarean delivery: Secondary | ICD-10-CM

## 2015-04-06 LAB — POCT URINALYSIS DIP (DEVICE)
Bilirubin Urine: NEGATIVE
Glucose, UA: NEGATIVE mg/dL
HGB URINE DIPSTICK: NEGATIVE
KETONES UR: NEGATIVE mg/dL
Nitrite: NEGATIVE
Protein, ur: NEGATIVE mg/dL
SPECIFIC GRAVITY, URINE: 1.02 (ref 1.005–1.030)
UROBILINOGEN UA: 1 mg/dL (ref 0.0–1.0)
pH: 7.5 (ref 5.0–8.0)

## 2015-04-06 MED ORDER — CONCEPT DHA 53.5-38-1 MG PO CAPS: 1.0000 | ORAL_CAPSULE | Freq: Every day | ORAL | Status: DC

## 2015-04-06 MED ORDER — INTEGRA F 125-1 MG PO CAPS: 1.0000 | ORAL_CAPSULE | Freq: Every day | ORAL | Status: DC

## 2015-04-06 NOTE — Progress Notes (Signed)
Leave message on [phone].  Send in gel, not flagyl if +BV  Subjective:    Stacy Moss is a 24 y.o. female being seen today for her obstetrical visit. She is at [redacted]w[redacted]d gestation. Patient reports yellowish vaginal discharge; denies odor or itching.. Fetal movement: normal.  Discussed plans for adoption.  No longer desires adoption.  Desires to breastfeed for longer term.    Menstrual History: OB History    Gravida Para Term Preterm AB TAB SAB Ectopic Multiple Living   5 2 2  0 2 0 2 0 0 2        The following portions of the patient's history were reviewed and updated as appropriate: allergies, current medications, past family history, past medical history, past social history, past surgical history and problem list.  Review of Systems Pertinent items are noted in HPI.   Objective:     BP 125/71 mmHg  Pulse 81  Temp(Src) 98.4 F (36.9 C)  Wt 208 lb 6.4 oz (94.53 kg)  LMP 10/22/2014 (Approximate) Uterine Size: 22 cm  Pelvic Exam:          Perineum: no abnormal discharge noted          Wet Prep/GC: Pending     Assessment:    Marland Kitchen [id number] at [redacted]w[redacted]d wks IUP   Plan:    Problem list reviewed and updated. Labs reviewed. Role of ultrasound in pregnancy discussed; fetal survey: ordered. Follow up in 4 weeks. Eino Farber Kennith Gain, CNM

## 2015-04-07 LAB — WET PREP, GENITAL
Clue Cells Wet Prep HPF POC: NONE SEEN
Trich, Wet Prep: NONE SEEN
YEAST WET PREP: NONE SEEN

## 2015-04-07 LAB — GC/CHLAMYDIA PROBE AMP
CT Probe RNA: NEGATIVE
GC Probe RNA: NEGATIVE

## 2015-04-08 ENCOUNTER — Encounter: Payer: Self-pay | Admitting: *Deleted

## 2015-04-11 ENCOUNTER — Ambulatory Visit (HOSPITAL_COMMUNITY): Admission: RE | Admit: 2015-04-11 | Payer: Medicaid Other | Source: Ambulatory Visit

## 2015-04-18 ENCOUNTER — Encounter: Payer: Medicaid Other | Admitting: Cardiology

## 2015-04-18 NOTE — Progress Notes (Signed)
This encounter was created in error - please disregard.

## 2015-04-20 ENCOUNTER — Other Ambulatory Visit: Payer: Self-pay | Admitting: General Practice

## 2015-04-20 ENCOUNTER — Ambulatory Visit (HOSPITAL_COMMUNITY)
Admission: RE | Admit: 2015-04-20 | Discharge: 2015-04-20 | Disposition: A | Discharge status: Home / Self Care | Payer: Medicaid Other | Source: Ambulatory Visit | Attending: Family | Admitting: Family

## 2015-04-20 DIAGNOSIS — Z3689 Encounter for other specified antenatal screening: Secondary | ICD-10-CM

## 2015-04-20 DIAGNOSIS — Z36 Encounter for antenatal screening of mother: Secondary | ICD-10-CM | POA: Insufficient documentation

## 2015-04-20 DIAGNOSIS — Z3A21 21 weeks gestation of pregnancy: Secondary | ICD-10-CM | POA: Insufficient documentation

## 2015-04-26 ENCOUNTER — Other Ambulatory Visit (HOSPITAL_COMMUNITY): Payer: Medicaid Other

## 2015-04-26 ENCOUNTER — Encounter: Payer: Self-pay | Admitting: *Deleted

## 2015-04-26 NOTE — Progress Notes (Signed)
Patient ID: Stacy Moss, female   DOB: 03/02/1991, 24 y.o.   MRN: [id number] Patient did not show up for 04/26/2015 2:00 PM appointment to have a 30 day cardiac event monitor applied.

## 2015-04-28 ENCOUNTER — Encounter (HOSPITAL_COMMUNITY): Payer: Self-pay | Admitting: Cardiology

## 2015-04-28 ENCOUNTER — Encounter: Payer: Medicaid Other | Admitting: Family

## 2015-06-12 ENCOUNTER — Inpatient Hospital Stay (HOSPITAL_COMMUNITY)
Admission: AD | Admit: 2015-06-12 | Discharge: 2015-06-13 | Disposition: A | Discharge status: Home / Self Care | Payer: Medicaid Other | Source: Ambulatory Visit | Attending: Obstetrics and Gynecology | Admitting: Obstetrics and Gynecology

## 2015-06-12 DIAGNOSIS — F419 Anxiety disorder, unspecified: Secondary | ICD-10-CM | POA: Insufficient documentation

## 2015-06-12 DIAGNOSIS — O99343 Other mental disorders complicating pregnancy, third trimester: Secondary | ICD-10-CM | POA: Diagnosis not present

## 2015-06-12 DIAGNOSIS — N898 Other specified noninflammatory disorders of vagina: Secondary | ICD-10-CM | POA: Insufficient documentation

## 2015-06-12 DIAGNOSIS — F319 Bipolar disorder, unspecified: Secondary | ICD-10-CM | POA: Diagnosis not present

## 2015-06-12 DIAGNOSIS — O10913 Unspecified pre-existing hypertension complicating pregnancy, third trimester: Secondary | ICD-10-CM | POA: Diagnosis not present

## 2015-06-12 DIAGNOSIS — B349 Viral infection, unspecified: Secondary | ICD-10-CM | POA: Diagnosis not present

## 2015-06-12 DIAGNOSIS — O26893 Other specified pregnancy related conditions, third trimester: Secondary | ICD-10-CM | POA: Insufficient documentation

## 2015-06-12 DIAGNOSIS — Z3A29 29 weeks gestation of pregnancy: Secondary | ICD-10-CM | POA: Insufficient documentation

## 2015-06-12 NOTE — MAU Note (Signed)
Pt states that she has thrown up 3 times tonight. Hasn't kept anything down. Having some pelvic pain that she rates 8/10 that feels like a contraction. Has had a decrease in fetal movement today. States she last felt movement Friday. Denies vag bleeding. Having a yellow discharge that has an odor.

## 2015-06-13 ENCOUNTER — Encounter (HOSPITAL_COMMUNITY): Payer: Self-pay | Admitting: *Deleted

## 2015-06-13 LAB — URINALYSIS, ROUTINE W REFLEX MICROSCOPIC
BILIRUBIN URINE: NEGATIVE
GLUCOSE, UA: NEGATIVE mg/dL
HGB URINE DIPSTICK: NEGATIVE
KETONES UR: 40 mg/dL — AB
Leukocytes, UA: NEGATIVE
Nitrite: NEGATIVE
PROTEIN: NEGATIVE mg/dL
Specific Gravity, Urine: >1.03 — ABNORMAL HIGH (ref 1.005–1.030)
Urobilinogen, UA: 0.2 mg/dL (ref 0.0–1.0)
pH: 6 (ref 5.0–8.0)

## 2015-06-13 LAB — GC/CHLAMYDIA PROBE AMP (~~LOC~~) NOT AT ARMC
CHLAMYDIA, DNA PROBE: NEGATIVE
NEISSERIA GONORRHEA: NEGATIVE

## 2015-06-13 LAB — WET PREP, GENITAL
Trich, Wet Prep: NONE SEEN
Yeast Wet Prep HPF POC: NONE SEEN

## 2015-06-13 MED ORDER — METRONIDAZOLE 500 MG PO TABS: 500.0000 mg | ORAL_TABLET | Freq: Two times a day (BID) | ORAL | Status: DC

## 2015-06-13 MED ORDER — ONDANSETRON 4 MG PO TBDP: 4.0000 mg | ORAL_TABLET | Freq: Three times a day (TID) | ORAL | Status: DC | PRN

## 2015-06-13 MED ORDER — ONDANSETRON HCL 4 MG/2ML IJ SOLN
4.0000 mg | Freq: Once | INTRAMUSCULAR | Status: AC
  Administered 2015-06-13: 4 mg via INTRAVENOUS
  Filled 2015-06-13: qty 2

## 2015-06-13 MED ORDER — LACTATED RINGERS IV BOLUS (SEPSIS)
1000.0000 mL | Freq: Once | INTRAVENOUS | Status: AC
  Administered 2015-06-13: 1000 mL via INTRAVENOUS

## 2015-06-13 NOTE — MAU Note (Signed)
Dr. Doroteo Glassman to come to MAU to assess patient.

## 2015-06-13 NOTE — MAU Provider Note (Signed)
History   CSN: [id number]  Arrival date and time: 06/12/15 2346   None     HPI Patient is 24 y.o. [id number] [redacted]w[redacted]d here with complaints of vomiting, congestion, and decreased fetal movement. Patient states that she has been feeling unwell since yesterday. States that she has had not felt the baby be active since Friday so this concerns her. She started vomiting today and has had 3 episodes. She is unable to tolerate any PO. Has had congestion for last couple of days. States that her grandmother and kids have also been sick with similar symptoms.   Patient also with complaints of vaginal itching and discharge with associated odor.   Patient has not taken anything for her symptoms due to not knowing if it will affect baby.   Last week used THC - in substance abuse class  Denies LOF, VB, contractions.  OB History    Gravida Para Term Preterm AB TAB SAB Ectopic Multiple Living   0 2 0 2 0 0 2    -[facility]  Past Medical History  Diagnosis Date  . Hx of tonsillectomy   . S/P cesarean section 05/25/2012  . Seizures     age [age]- after fell and hit head  . Hypertension     on meds 2011  . Anxiety   . Mental disorder   . Depression   . Bipolar affective   . ADD (attention deficit disorder)     Past Surgical History  Procedure Laterality Date  . Tonsillectomy    . Cesarean section  05/25/2012    Procedure: CESAREAN SECTION;  Surgeon: Sherron Monday, MD;  Location: [facility] OR[facility];  Service: Gynecology;  Laterality: N/A;  Primary cesarean section with delivery of baby girl at 0700. Apgars9/9.  Marland Kitchen Orif orbital fracture Left 02/25/2015    Procedure: OPEN REDUCTION INTERNAL FIXATION (ORIF) LEFT ORBITAL FRACTURE;  Surgeon: Melvenia Beam, MD;  Location: [facility] OR;  Service: ENT;  Laterality: Left;    Family History  Problem Relation Age of Onset  . Anesthesia problems Neg Hx   . Cancer Maternal Grandmother   . Hypertension Maternal Grandmother   . Hypertension Mother     Social History  Substance  Use Topics  . Smoking status: Current Every Day Smoker -- 0.00 packs/day for 0 years    Types: Cigarettes  . Smokeless tobacco: Never Used  . Alcohol Use: No    Allergies: No Known Allergies  No prescriptions prior to admission    Review of Systems  Constitutional: Negative for fever.  HENT: Positive for congestion.   Respiratory: Positive for cough and sputum production. Negative for shortness of breath.   Cardiovascular: Negative for chest pain.  Gastrointestinal: Positive for nausea, vomiting and abdominal pain.  Genitourinary: Negative for dysuria.  Musculoskeletal: Positive for back pain.  Neurological: Positive for weakness and headaches.   Physical Exam   Blood pressure 115/66, pulse 93, temperature 98.6 F (37 C), temperature source Oral, resp. rate 18, height  (1.651 m), weight 214 lb 12.8 oz (97.433 kg), last menstrual period 10/22/2014, SpO2 99 %, unknown if currently breastfeeding.  Physical Exam  Constitutional: She appears well-developed and well-nourished. No distress.  HENT:  Head: Normocephalic and atraumatic.  Mouth/Throat: Oropharynx is clear and moist. Mucous membranes are pale and dry.  Eyes: EOM are normal. Pupils are equal, round, and reactive to light.  Cardiovascular: Normal rate, regular rhythm, normal heart sounds and intact distal pulses.   Respiratory: Effort normal and breath sounds  normal. She exhibits no tenderness.  GI: Soft. Bowel sounds are normal. There is no tenderness.  gravid  Genitourinary: Vaginal discharge found.  Musculoskeletal: She exhibits no edema or tenderness.  Skin: Skin is warm and dry.   Results for orders placed or performed during the hospital encounter of 06/12/15 (from the past 24 hour(s))  Urinalysis, Routine w reflex microscopic (not at [facility])     Status: Abnormal   Collection Time: 06/12/15 11:59 PM  Result Value Ref Range   Color, Urine YELLOW YELLOW   APPearance CLEAR CLEAR   Specific Gravity, Urine >1.030  (H) 1.005 - 1.030   pH 6.0 5.0 - 8.0   Glucose, UA NEGATIVE NEGATIVE mg/dL   Hgb urine dipstick NEGATIVE NEGATIVE   Bilirubin Urine NEGATIVE NEGATIVE   Ketones, ur 40 (A) NEGATIVE mg/dL   Protein, ur NEGATIVE NEGATIVE mg/dL   Urobilinogen, UA 0.2 0.0 - 1.0 mg/dL   Nitrite NEGATIVE NEGATIVE   Leukocytes, UA NEGATIVE NEGATIVE  Wet prep, genital     Status: Abnormal   Collection Time: 06/13/15  1:20 AM  Result Value Ref Range   Yeast Wet Prep HPF POC NONE SEEN NONE SEEN   Trich, Wet Prep NONE SEEN NONE SEEN   Clue Cells Wet Prep HPF POC FEW (A) NONE SEEN   WBC, Wet Prep HPF POC FEW (A) NONE SEEN   MAU Course  Procedures - None  MDM: -UA with signs of dehydration -1L LR fluid bolus -IV zofran -FHT reassuring and reactive; no contractions -wet prep with clue cells  Assessment and Plan  A: Patient is 24 y.o. [id number] [redacted]w[redacted]d reporting a constilation of symptoms consistent with viral illness. Vitals are stable and patient afebrile. Patient improved after interventions. Vaginal discharge consistent with BV.     P: Discharge home patient stable - Rx for flagyl and zofran - Encouraged PO hydration - Reviewed findings and my conclusion - fetal kick counts reinforced - Handout given - Follow-up with OB provider  Caryl Ada, DO PGY-2, [facility]

## 2015-06-16 ENCOUNTER — Ambulatory Visit (INDEPENDENT_AMBULATORY_CARE_PROVIDER_SITE_OTHER): Payer: Self-pay | Admitting: Certified Nurse Midwife

## 2015-06-16 VITALS — BP 128/68 | HR 102 | Temp 98.4°F | Wt 211.8 lb

## 2015-06-16 DIAGNOSIS — O09899 Supervision of other high risk pregnancies, unspecified trimester: Secondary | ICD-10-CM

## 2015-06-16 LAB — CBC
HCT: 30.6 % — ABNORMAL LOW (ref 36.0–46.0)
Hemoglobin: 10.2 g/dL — ABNORMAL LOW (ref 12.0–15.0)
MCH: 29.2 pg (ref 26.0–34.0)
MCHC: 33.3 g/dL (ref 30.0–36.0)
MCV: 87.7 fL (ref 78.0–100.0)
MPV: 9.6 fL (ref 8.6–12.4)
Platelets: 283 10*3/uL (ref 150–400)
RBC: 3.49 MIL/uL — ABNORMAL LOW (ref 3.87–5.11)
RDW: 12.7 % (ref 11.5–15.5)
WBC: 13.6 10*3/uL — ABNORMAL HIGH (ref 4.0–10.5)

## 2015-06-16 LAB — POCT URINALYSIS DIP (DEVICE)
Glucose, UA: NEGATIVE mg/dL
Hgb urine dipstick: NEGATIVE
Nitrite: NEGATIVE
Protein, ur: 30 mg/dL — AB
Specific Gravity, Urine: 1.025 (ref 1.005–1.030)
Urobilinogen, UA: 1 mg/dL (ref 0.0–1.0)
pH: 7 (ref 5.0–8.0)

## 2015-06-16 MED ORDER — CEPHALEXIN 500 MG PO CAPS: 500.0000 mg | ORAL_CAPSULE | Freq: Three times a day (TID) | ORAL | Status: DC

## 2015-06-16 NOTE — Progress Notes (Signed)
Pt. C/o poor appetite, boils under her arms, runny nose and productive cough with yellowish , sometimes green. States has been feeling bad for last week and a half. States since she has been sick has felt baby moving less for the last 5 days ago. Went to [facility] 06/13/15 for c/o nausea, weakness and states they gave her IV's. Would like to wait until next visit for flu and tdap due to not feeling well today.

## 2015-06-16 NOTE — Progress Notes (Signed)
Subjective:  Stacy Moss is a 24 y.o. [phone] at [redacted]w[redacted]d being seen today for ongoing prenatal care. She has missed several visits. She was seen in [facility] on 8/22 and was given a RX for Flagyl and Zofran. She has not taken that medicine yet. States she will pick it up today. Patient reports complaints about bilateral Boils under her arms that have been there for the pst 2 weeks. she has tried heat, bath but with no relief. will start pt on ABX and refer to dermatology.  Contractions: Not present.  Vag. Bleeding: None. Movement: (!) Decreased. Denies leaking of fluid.   The following portions of the patient's history were reviewed and updated as appropriate: allergies, current medications, past family history, past medical history, past social history, past surgical history and problem list.   Objective:   Filed Vitals:   06/16/15 1353  BP: 128/68  Pulse: 102  Temp: 98.4 F (36.9 C)  Weight: 211 lb 12.8 oz (96.072 kg)    Fetal Status: Fetal Heart Rate (bpm): 157 Fundal Height: 29 cm Movement: (!) Decreased     General:  Alert, oriented and cooperative. Patient is in no acute distress.  Skin: Skin is warm and dry. No rash noted.   Cardiovascular: Normal heart rate noted  Respiratory: Normal respiratory effort, no problems with respiration noted  Abdomen: Soft, gravid, appropriate for gestational age. Pain/Pressure: Present     Pelvic: Vag. Bleeding: None Vag D/C Character: Yellow   Cervical exam deferred        Extremities: Normal range of motion.  Edema: Trace  Mental Status: Normal mood and affect. Normal behavior. Normal judgment and thought content.   Urinalysis:      Assessment and Plan:  Pregnancy: [id number] at [redacted]w[redacted]d  1. Short interval between pregnancies affecting pregnancy, antepartum  - Glucose Tolerance, 1 HR (50g) w/o Fasting - CBC - RPR - HIV antibody (with reflex) - Ambulatory referral to Dermatology RX for Keflex  Preterm labor symptoms and general obstetric  precautions including but not limited to vaginal bleeding, contractions, leaking of fluid and fetal movement were reviewed in detail with the patient. Please refer to After Visit Summary for other counseling recommendations.  Return in about 2 weeks (around 06/30/2015).   Rhea Pink, CNM

## 2015-06-16 NOTE — Patient Instructions (Signed)
Abscess °An abscess (boil or furuncle) is an infected area on or under the skin. This area is filled with yellowish-white fluid (pus) and other material (debris). °HOME CARE  °· Only take medicines as told by your doctor. °· If you were given antibiotic medicine, take it as directed. Finish the medicine even if you start to feel better. °· If gauze is used, follow your doctor's directions for changing the gauze. °· To avoid spreading the infection: °¨ Keep your abscess covered with a bandage. °¨ Wash your hands well. °¨ Do not share personal care items, towels, or whirlpools with others. °¨ Avoid skin contact with others. °· Keep your skin and clothes clean around the abscess. °· Keep all doctor visits as told. °GET HELP RIGHT AWAY IF:  °· You have more pain, puffiness (swelling), or redness in the wound site. °· You have more fluid or blood coming from the wound site. °· You have muscle aches, chills, or you feel sick. °· You have a fever. °MAKE SURE YOU:  °· Understand these instructions. °· Will watch your condition. °· Will get help right away if you are not doing well or get worse. °Document Released: 03/26/2008 Document Revised: 04/08/2012 Document Reviewed: 12/21/2011 °ExitCare® Patient Information ©2015 [employer]. This information is not intended to replace advice given to you by your health care provider. Make sure you discuss any questions you have with your health care provider. ° °

## 2015-06-16 NOTE — Progress Notes (Signed)
Discussed fetal movement with pt. Pt states that she has felt fetal movmeent today and thinks she is not aware as much because she is under a lot of stress.

## 2015-06-17 LAB — GLUCOSE TOLERANCE, 1 HOUR (50G) W/O FASTING: Glucose, 1 Hour GTT: 77 mg/dL (ref 70–140)

## 2015-06-17 LAB — RPR

## 2015-06-17 LAB — HIV ANTIBODY (ROUTINE TESTING W REFLEX): HIV 1&2 Ab, 4th Generation: NONREACTIVE

## 2015-06-24 ENCOUNTER — Telehealth: Payer: Self-pay | Admitting: Cardiology

## 2015-06-24 NOTE — Telephone Encounter (Signed)
Needs to get studies done before followup with me

## 2015-06-24 NOTE — Telephone Encounter (Signed)
New message      Called to confirm next wed appt with pt.  She stated she did not have an echo and did not wear the event monitor.  Will she still need to keep this appt?

## 2015-06-24 NOTE — Telephone Encounter (Signed)
Will forward to Dr Turner for review  

## 2015-06-28 NOTE — Telephone Encounter (Signed)
Left message for patient that OV will need to be rescheduled after testing. Instructed patient to call back to reschedule.

## 2015-06-29 ENCOUNTER — Ambulatory Visit: Payer: Medicaid Other | Admitting: Cardiology

## 2015-06-29 NOTE — Telephone Encounter (Signed)
Confirmed with patient that OV today has been cancelled. Informed her that [facility] will call and reschedule event monitor, ECHO, and OV with Dr. Mayford Knife. Patient agrees with treatment plan.

## 2015-06-30 ENCOUNTER — Encounter: Payer: Self-pay | Admitting: Family

## 2015-07-13 ENCOUNTER — Ambulatory Visit (INDEPENDENT_AMBULATORY_CARE_PROVIDER_SITE_OTHER): Payer: Medicaid Other | Admitting: Obstetrics and Gynecology

## 2015-07-13 VITALS — BP 131/73 | HR 91 | Temp 98.2°F | Wt 219.1 lb

## 2015-07-13 DIAGNOSIS — Z23 Encounter for immunization: Secondary | ICD-10-CM

## 2015-07-13 DIAGNOSIS — Z3482 Encounter for supervision of other normal pregnancy, second trimester: Secondary | ICD-10-CM | POA: Diagnosis present

## 2015-07-13 LAB — POCT URINALYSIS DIP (DEVICE)
Bilirubin Urine: NEGATIVE
GLUCOSE, UA: NEGATIVE mg/dL
HGB URINE DIPSTICK: NEGATIVE
KETONES UR: NEGATIVE mg/dL
Nitrite: NEGATIVE
Protein, ur: NEGATIVE mg/dL
SPECIFIC GRAVITY, URINE: 1.02 (ref 1.005–1.030)
UROBILINOGEN UA: 0.2 mg/dL (ref 0.0–1.0)
pH: 7.5 (ref 5.0–8.0)

## 2015-07-13 MED ORDER — TETANUS-DIPHTH-ACELL PERTUSSIS 5-2.5-18.5 LF-MCG/0.5 IM SUSP
0.5000 mL | Freq: Once | INTRAMUSCULAR | Status: AC
  Administered 2015-07-13: 0.5 mL via INTRAMUSCULAR

## 2015-07-13 MED ORDER — FERROUS SULFATE 325 (65 FE) MG PO TABS: 325.0000 mg | ORAL_TABLET | Freq: Every day | ORAL | Status: DC

## 2015-07-13 MED ORDER — METRONIDAZOLE 0.75 % VA GEL: 1.0000 | Freq: Every day | VAGINAL | Status: DC

## 2015-07-13 MED ORDER — TERCONAZOLE 0.4 % VA CREA: 1.0000 | TOPICAL_CREAM | Freq: Every day | VAGINAL | Status: DC

## 2015-07-13 NOTE — Progress Notes (Signed)
Patient states she has not taken medication for BV, patient requests cream instead of.  Reviewed tip of week with patient

## 2015-07-13 NOTE — Progress Notes (Signed)
Subjective:  Stacy Moss 24 y.o. [phone] at [redacted]w[redacted]d being seen today for ongoing prenatal care.  Patient reports no complaints.  Contractions: Irritability.  Vag. Bleeding: None. Movement: Present. Denies leaking of fluid.   The following portions of the patient's history were reviewed and updated as appropriate: allergies, current medications, past family history, past medical history, past social history, past surgical history and problem list.   Objective:   Filed Vitals:   07/13/15 0917  BP: 131/73  Pulse: 91  Temp: 98.2 F (36.8 C)  Weight: 219 lb 1.6 oz (99.383 kg)    Fetal Status: Fetal Heart Rate (bpm): 163 Fundal Height: 32 cm Movement: Present     General:  Alert, oriented and cooperative. Patient is in no acute distress.  Skin: Skin is warm and dry. No rash noted.   Cardiovascular: Normal heart rate noted  Respiratory: Normal respiratory effort, no problems with respiration noted  Abdomen: Soft, gravid, appropriate for gestational age. Pain/Pressure: Absent     Pelvic: Vag. Bleeding: None Vag D/C Character: Yellow   Cervical exam deferred        Extremities: Normal range of motion.  Edema: Trace  Mental Status: Normal mood and affect. Normal behavior. Normal judgment and thought content.   Urinalysis: Urine Protein: Negative Urine Glucose: Negative  Assessment and Plan:  Pregnancy: [id number] at [redacted]w[redacted]d  1. Encounter for supervision of other normal pregnancy in second trimester - Tdap (BOOSTRIX) injection 0.5 mL; Inject 0.5 mLs into the muscle once. - Flu Vaccine QUAD 36+ mos IM; Standing - Flu Vaccine QUAD 36+ mos IM - [place] MFM OB FOLLOW UP; Future - overdue for this, scheduling today  # Anemia, mild - start ferrous sulfate daily  # BV - doesn't want orals - metrogel  Preterm labor symptoms and general obstetric precautions including but not limited to vaginal bleeding, contractions, leaking of fluid and fetal movement were reviewed in detail with the  patient. Please refer to After Visit Summary for other counseling recommendations.  Return in about 2 weeks (around 07/27/2015).   Kathrynn Running, MD

## 2015-07-22 ENCOUNTER — Other Ambulatory Visit: Payer: Self-pay | Admitting: General Practice

## 2015-07-22 ENCOUNTER — Ambulatory Visit (HOSPITAL_COMMUNITY)
Admission: RE | Admit: 2015-07-22 | Discharge: 2015-07-22 | Disposition: A | Discharge status: Home / Self Care | Payer: Medicaid Other | Source: Ambulatory Visit | Attending: Internal Medicine | Admitting: Internal Medicine

## 2015-07-22 DIAGNOSIS — Z3A34 34 weeks gestation of pregnancy: Secondary | ICD-10-CM

## 2015-07-22 DIAGNOSIS — O09299 Supervision of pregnancy with other poor reproductive or obstetric history, unspecified trimester: Secondary | ICD-10-CM

## 2015-07-22 DIAGNOSIS — O09293 Supervision of pregnancy with other poor reproductive or obstetric history, third trimester: Secondary | ICD-10-CM | POA: Diagnosis not present

## 2015-07-22 DIAGNOSIS — O34219 Maternal care for unspecified type scar from previous cesarean delivery: Secondary | ICD-10-CM

## 2015-07-22 DIAGNOSIS — O99323 Drug use complicating pregnancy, third trimester: Secondary | ICD-10-CM | POA: Insufficient documentation

## 2015-07-22 DIAGNOSIS — O283 Abnormal ultrasonic finding on antenatal screening of mother: Secondary | ICD-10-CM | POA: Insufficient documentation

## 2015-07-22 DIAGNOSIS — Z3493 Encounter for supervision of normal pregnancy, unspecified, third trimester: Secondary | ICD-10-CM

## 2015-07-22 DIAGNOSIS — IMO0002 Reserved for concepts with insufficient information to code with codable children: Secondary | ICD-10-CM

## 2015-07-22 DIAGNOSIS — Z36 Encounter for antenatal screening of mother: Secondary | ICD-10-CM | POA: Insufficient documentation

## 2015-07-22 DIAGNOSIS — O289 Unspecified abnormal findings on antenatal screening of mother: Secondary | ICD-10-CM

## 2015-07-22 DIAGNOSIS — O09893 Supervision of other high risk pregnancies, third trimester: Secondary | ICD-10-CM

## 2015-07-22 DIAGNOSIS — O3421 Maternal care for scar from previous cesarean delivery: Secondary | ICD-10-CM | POA: Diagnosis not present

## 2015-07-22 DIAGNOSIS — Z3482 Encounter for supervision of other normal pregnancy, second trimester: Secondary | ICD-10-CM

## 2015-07-22 DIAGNOSIS — Z0489 Encounter for examination and observation for other specified reasons: Secondary | ICD-10-CM

## 2015-07-27 ENCOUNTER — Ambulatory Visit (INDEPENDENT_AMBULATORY_CARE_PROVIDER_SITE_OTHER): Payer: Medicaid Other | Admitting: Obstetrics and Gynecology

## 2015-07-27 VITALS — BP 134/69 | HR 81 | Ht 69.0 in | Wt 221.6 lb

## 2015-07-27 DIAGNOSIS — Z3482 Encounter for supervision of other normal pregnancy, second trimester: Secondary | ICD-10-CM | POA: Diagnosis present

## 2015-07-27 LAB — POCT URINALYSIS DIP (DEVICE)
BILIRUBIN URINE: NEGATIVE
Glucose, UA: NEGATIVE mg/dL
HGB URINE DIPSTICK: NEGATIVE
KETONES UR: NEGATIVE mg/dL
Leukocytes, UA: NEGATIVE
Nitrite: NEGATIVE
PH: 7.5 (ref 5.0–8.0)
PROTEIN: NEGATIVE mg/dL
SPECIFIC GRAVITY, URINE: 1.02 (ref 1.005–1.030)
Urobilinogen, UA: 1 mg/dL (ref 0.0–1.0)

## 2015-07-27 NOTE — Progress Notes (Signed)
Subjective:  Stacy Moss is a 24 y.o. [phone] at [redacted]w[redacted]d being seen today for ongoing prenatal care.  Patient reports no complaints.  Contractions: Not present.  Vag. Bleeding: None. Movement: Present. Denies leaking of fluid.   The following portions of the patient's history were reviewed and updated as appropriate: allergies, current medications, past family history, past medical history, past social history, past surgical history and problem list.   Objective:   Filed Vitals:   07/27/15 1034 07/27/15 1040  BP: 134/69   Pulse: 81   Height:   (1.753 m)  Weight: 221 lb 9.6 oz (100.517 kg)     Fetal Status: Fetal Heart Rate (bpm): 150   Movement: Present     General:  Alert, oriented and cooperative. Patient is in no acute distress.  Skin: Skin is warm and dry. No rash noted.   Cardiovascular: Normal heart rate noted  Respiratory: Normal respiratory effort, no problems with respiration noted  Abdomen: Soft, gravid, appropriate for gestational age. Pain/Pressure: Present     Pelvic: Vag. Bleeding: None Vag D/C Character: White   Cervical exam deferred        Extremities: Normal range of motion.  Edema: Trace  Mental Status: Normal mood and affect. Normal behavior. Normal judgment and thought content.   Urinalysis: Urine Protein: Negative Urine Glucose: Negative  Assessment and Plan:  Pregnancy: [id number] at [redacted]w[redacted]d  # Pregnancy - normal f/u anatomy scan - gbs/gc/chlamydia next visit   Preterm labor symptoms and general obstetric precautions including but not limited to vaginal bleeding, contractions, leaking of fluid and fetal movement were reviewed in detail with the patient. Please refer to After Visit Summary for other counseling recommendations.  Return in about 1 week (around 08/03/2015).   Kathrynn Running, MD

## 2015-07-27 NOTE — Progress Notes (Signed)
Pt reports some pelvic pressure and pain at bottom of stomach.

## 2015-08-03 ENCOUNTER — Other Ambulatory Visit (HOSPITAL_COMMUNITY)
Admission: RE | Admit: 2015-08-03 | Discharge: 2015-08-03 | Disposition: A | Discharge status: Home / Self Care | Payer: Medicaid Other | Source: Ambulatory Visit | Attending: Advanced Practice Midwife | Admitting: Advanced Practice Midwife

## 2015-08-03 ENCOUNTER — Ambulatory Visit (INDEPENDENT_AMBULATORY_CARE_PROVIDER_SITE_OTHER): Payer: Medicaid Other | Admitting: Advanced Practice Midwife

## 2015-08-03 VITALS — BP 128/73 | HR 87 | Temp 98.0°F | Wt 224.4 lb

## 2015-08-03 DIAGNOSIS — Z113 Encounter for screening for infections with a predominantly sexual mode of transmission: Secondary | ICD-10-CM

## 2015-08-03 DIAGNOSIS — B373 Candidiasis of vulva and vagina: Secondary | ICD-10-CM | POA: Diagnosis not present

## 2015-08-03 DIAGNOSIS — Z3482 Encounter for supervision of other normal pregnancy, second trimester: Secondary | ICD-10-CM | POA: Diagnosis present

## 2015-08-03 DIAGNOSIS — O98813 Other maternal infectious and parasitic diseases complicating pregnancy, third trimester: Secondary | ICD-10-CM

## 2015-08-03 DIAGNOSIS — B3731 Acute candidiasis of vulva and vagina: Secondary | ICD-10-CM

## 2015-08-03 LAB — POCT URINALYSIS DIP (DEVICE)
Bilirubin Urine: NEGATIVE
GLUCOSE, UA: NEGATIVE mg/dL
Hgb urine dipstick: NEGATIVE
Ketones, ur: NEGATIVE mg/dL
LEUKOCYTES UA: NEGATIVE
Nitrite: NEGATIVE
PROTEIN: NEGATIVE mg/dL
SPECIFIC GRAVITY, URINE: 1.02 (ref 1.005–1.030)
UROBILINOGEN UA: 0.2 mg/dL (ref 0.0–1.0)
pH: 7.5 (ref 5.0–8.0)

## 2015-08-03 LAB — OB RESULTS CONSOLE GBS: GBS: POSITIVE

## 2015-08-03 LAB — OB RESULTS CONSOLE GC/CHLAMYDIA: GC PROBE AMP, GENITAL: NEGATIVE

## 2015-08-03 MED ORDER — TERCONAZOLE 0.4 % VA CREA: 1.0000 | TOPICAL_CREAM | Freq: Every day | VAGINAL | Status: DC

## 2015-08-03 NOTE — Patient Instructions (Signed)
Vaginal Delivery °During delivery, your health care provider will help you give birth to your baby. During a vaginal delivery, you will work to push the baby out of your vagina. However, before you can push your baby out, a few things need to happen. The opening of your uterus (cervix) has to soften, thin out, and open up (dilate) all the way to 10 cm. Also, your baby has to move down from the uterus into your vagina.  °SIGNS OF LABOR  °Your health care provider will first need to make sure you are in labor. Signs of labor include:  °· Passing what is called the mucous plug before labor begins. This is a small amount of blood-stained mucus. °· Having regular, painful uterine contractions.   °· The time between contractions gets shorter.   °· The discomfort and pain gradually get more intense. °· Contraction pains get worse when walking and do not go away when resting.   °· Your cervix becomes thinner (effacement) and dilates. °BEFORE THE DELIVERY °Once you are in labor and admitted into the hospital or care center, your health care provider may do the following:  °· Perform a complete physical exam. °· Review any complications related to pregnancy or labor.  °· Check your blood pressure, pulse, temperature, and heart rate (vital signs).   °· Determine if, and when, the rupture of amniotic membranes occurred. °· Do a vaginal exam (using a sterile glove and lubricant) to determine:   °¨ The position (presentation) of the baby. Is the baby's head presenting first (vertex) in the birth canal (vagina), or are the feet or buttocks first (breech)?   °¨ The level (station) of the baby's head within the birth canal.   °¨ The effacement and dilatation of the cervix.   °· An electronic fetal monitor is usually placed on your abdomen when you first arrive. This is used to monitor your contractions and the baby's heart rate. °¨ When the monitor is on your abdomen (external fetal monitor), it can only pick up the frequency and  length of your contractions. It cannot tell the strength of your contractions. °¨ If it becomes necessary for your health care provider to know exactly how strong your contractions are or to see exactly what the baby's heart rate is doing, an internal monitor may be inserted into your vagina and uterus. Your health care provider will discuss the benefits and risks of using an internal monitor and obtain your permission before inserting the device. °¨ Continuous fetal monitoring may be needed if you have an epidural, are receiving certain medicines (such as oxytocin), or have pregnancy or labor complications. °· An IV access tube may be placed into a vein in your arm to deliver fluids and medicines if necessary. °THREE STAGES OF LABOR AND DELIVERY °Normal labor and delivery is divided into three stages. °First Stage °This stage starts when you begin to contract regularly and your cervix begins to efface and dilate. It ends when your cervix is completely open (fully dilated). The first stage is the longest stage of labor and can last from 3 hours to 15 hours.  °Several methods are available to help with labor pain. You and your health care provider will decide which option is best for you. Options include:  °· Opioid medicines. These are strong pain medicines that you can get through your IV tube or as a shot into your muscle. These medicines lessen pain but do not make it go away completely.  °· Epidural. A medicine is given through a thin tube that   is inserted in your back. The medicine numbs the lower part of your body and prevents any pain in that area. °· Paracervical pain medicine. This is an injection of an anesthetic on each side of your cervix.   °· You may request natural childbirth, which does not involve the use of pain medicines or an epidural during labor and delivery. Instead, you will use other things, such as breathing exercises, to help cope with the pain. °Second Stage °The second stage of labor  begins when your cervix is fully dilated at 10 cm. It continues until you push your baby down through the birth canal and the baby is born. This stage can take only minutes or several hours. °· The location of your baby's head as it moves through the birth canal is reported as a number called a station. If the baby's head has not started its descent, the station is described as being at minus 3 (-3). When your baby's head is at the zero station, it is at the middle of the birth canal and is engaged in the pelvis. The station of your baby helps indicate the progress of the second stage of labor. °· When your baby is born, your health care provider may hold the baby with his or her head lowered to prevent amniotic fluid, mucus, and blood from getting into the baby's lungs. The baby's mouth and nose may be suctioned with a small bulb syringe to remove any additional fluid. °· Your health care provider may then place the baby on your stomach. It is important to keep the baby from getting cold. To do this, the health care provider will dry the baby off, place the baby directly on your skin (with no blankets between you and the baby), and cover the baby with warm, dry blankets.   °· The umbilical cord is cut. °Third Stage °During the third stage of labor, your health care provider will deliver the placenta (afterbirth) and make sure your bleeding is under control. The delivery of the placenta usually takes about 5 minutes but can take up to 30 minutes. After the placenta is delivered, a medicine may be given either by IV or injection to help contract the uterus and control bleeding. If you are planning to breastfeed, you can try to do so now. °After you deliver the placenta, your uterus should contract and get very firm. If your uterus does not remain firm, your health care provider will massage it. This is important because the contraction of the uterus helps cut off bleeding at the site where the placenta was attached  to your uterus. If your uterus does not contract properly and stay firm, you may continue to bleed heavily. If there is a lot of bleeding, medicines may be given to contract the uterus and stop the bleeding.  °  °This information is not intended to replace advice given to you by your health care provider. Make sure you discuss any questions you have with your health care provider. °  °Document Released: 07/17/2008 Document Revised: 10/29/2014 Document Reviewed: 06/04/2012 °Elsevier Interactive Patient Education ©2016 [employer] ° °

## 2015-08-03 NOTE — Progress Notes (Signed)
Breastfeeding tip of the week reviewed Cultures today Patient reports leaking of fluid with coughing, sneezing, laughing, etc. X 3-4 days

## 2015-08-03 NOTE — Progress Notes (Signed)
Subjective:  Stacy NashDesiree M Moss is a 24 y.o. [phone] at 8038w3d being seen today for ongoing prenatal care.  Patient reports leaking small amount of fluid daily x 1 week. .  Contractions: Irregular.  Vag. Bleeding: None. Movement: Present. Denies leaking of fluid.   The following portions of the patient's history were reviewed and updated as appropriate: allergies, current medications, past family history, past medical history, past social history, past surgical history and problem list. Problem list updated.  Objective:   Filed Vitals:   08/03/15 1143  BP: 128/73  Pulse: 87  Temp: 98 F (36.7 C)  Weight: 224 lb 6.4 oz (101.787 kg)    Fetal Status: Fetal Heart Rate (bpm): 140   Movement: Present     General:  Alert, oriented and cooperative. Patient is in no acute distress.  Skin: Skin is warm and dry. No rash noted.   Cardiovascular: Normal heart rate noted  Respiratory: Normal respiratory effort, no problems with respiration noted  Abdomen: Soft, gravid, appropriate for gestational age. Pain/Pressure: Present     Pelvic: Vag. Bleeding: None Vag D/C Character: moderate amount of thick, white, odorless discharge. Neg pooling. Neg fern.    Cervical exam performed      1/50/-3  Extremities: Normal range of motion.  Edema: Trace  Mental Status: Normal mood and affect. Normal behavior. Normal judgment and thought content.   Urinalysis:      Assessment and Plan:  Pregnancy: [id number] at 738w3d  1. Encounter for supervision of other normal pregnancy in second trimester  - Culture, beta strep (group b only) - GC[facility]Chlamydia probe amp [facility]  Term labor symptoms and general obstetric precautions including but not limited to vaginal bleeding, contractions, leaking of fluid and fetal movement were reviewed in detail with the patient. Please refer to After Visit Summary for other counseling recommendations.  F/U in 1 week  Offered Terazol for clinical Dx. [facility]. Rx sent.  Wet  prep  Dorathy KinsmanVirginia Tradarius Reinwald, [place]

## 2015-08-04 ENCOUNTER — Telehealth: Payer: Self-pay | Admitting: *Deleted

## 2015-08-04 ENCOUNTER — Other Ambulatory Visit: Payer: Self-pay | Admitting: Advanced Practice Midwife

## 2015-08-04 ENCOUNTER — Encounter: Payer: Self-pay | Admitting: Advanced Practice Midwife

## 2015-08-04 DIAGNOSIS — N760 Acute vaginitis: Principal | ICD-10-CM

## 2015-08-04 DIAGNOSIS — O26893 Other specified pregnancy related conditions, third trimester: Secondary | ICD-10-CM

## 2015-08-04 DIAGNOSIS — N898 Other specified noninflammatory disorders of vagina: Secondary | ICD-10-CM

## 2015-08-04 DIAGNOSIS — O99820 Streptococcus B carrier state complicating pregnancy: Secondary | ICD-10-CM | POA: Insufficient documentation

## 2015-08-04 DIAGNOSIS — B9689 Other specified bacterial agents as the cause of diseases classified elsewhere: Secondary | ICD-10-CM

## 2015-08-04 LAB — GC/CHLAMYDIA PROBE AMP (~~LOC~~) NOT AT ARMC
CHLAMYDIA, DNA PROBE: NEGATIVE
NEISSERIA GONORRHEA: NEGATIVE

## 2015-08-04 LAB — WET PREP, GENITAL
Trich, Wet Prep: NONE SEEN
Yeast Wet Prep HPF POC: NONE SEEN

## 2015-08-04 LAB — CULTURE, BETA STREP (GROUP B ONLY)

## 2015-08-04 MED ORDER — METRONIDAZOLE 500 MG PO TABS: 500.0000 mg | ORAL_TABLET | Freq: Two times a day (BID) | ORAL | Status: DC

## 2015-08-04 MED ORDER — METRONIDAZOLE 0.75 % VA GEL: 1.0000 | Freq: Two times a day (BID) | VAGINAL | Status: DC

## 2015-08-04 NOTE — Telephone Encounter (Addendum)
Per Ivonne AndrewV. Smith CNM, called patient to inform her of +BV, flagyl prescription called to pharmacy. Patient ask if she prescribed the cream or the pills. I informed her that she prescribed pills. Patient is requesting the prescription be changed to the cream, as she cannot tolerate the taste of the pills. Routing message to V. Smith requesting this change.  10/14  1215  Called pt and informed her that Rx for Metrogel has been sent to her pharmacy. Since she tested negative for vaginal yeast, she will not need the Rx for Terazol cream.  Pt voiced understanding of information given.  Diane Day RNC

## 2015-08-04 NOTE — Progress Notes (Signed)
Wet prep positive BV. Rx Flagyl.

## 2015-08-10 ENCOUNTER — Ambulatory Visit (INDEPENDENT_AMBULATORY_CARE_PROVIDER_SITE_OTHER): Payer: Medicaid Other | Admitting: Advanced Practice Midwife

## 2015-08-10 ENCOUNTER — Encounter (HOSPITAL_COMMUNITY): Payer: Self-pay | Admitting: *Deleted

## 2015-08-10 ENCOUNTER — Inpatient Hospital Stay (HOSPITAL_COMMUNITY)
Admission: AD | Admit: 2015-08-10 | Discharge: 2015-08-14 | DRG: 766 | Disposition: A | Discharge status: Home / Self Care | Payer: Medicaid Other | Source: Ambulatory Visit | Attending: Obstetrics & Gynecology | Admitting: Obstetrics & Gynecology

## 2015-08-10 VITALS — BP 143/87 | HR 75 | Temp 98.5°F | Wt 225.8 lb

## 2015-08-10 DIAGNOSIS — H5319 Other subjective visual disturbances: Secondary | ICD-10-CM | POA: Diagnosis present

## 2015-08-10 DIAGNOSIS — O99820 Streptococcus B carrier state complicating pregnancy: Secondary | ICD-10-CM

## 2015-08-10 DIAGNOSIS — F17200 Nicotine dependence, unspecified, uncomplicated: Secondary | ICD-10-CM | POA: Diagnosis present

## 2015-08-10 DIAGNOSIS — F329 Major depressive disorder, single episode, unspecified: Secondary | ICD-10-CM | POA: Diagnosis present

## 2015-08-10 DIAGNOSIS — Z3A37 37 weeks gestation of pregnancy: Secondary | ICD-10-CM

## 2015-08-10 DIAGNOSIS — O1092 Unspecified pre-existing hypertension complicating childbirth: Secondary | ICD-10-CM | POA: Diagnosis present

## 2015-08-10 DIAGNOSIS — Z809 Family history of malignant neoplasm, unspecified: Secondary | ICD-10-CM | POA: Diagnosis not present

## 2015-08-10 DIAGNOSIS — O1494 Unspecified pre-eclampsia, complicating childbirth: Secondary | ICD-10-CM | POA: Diagnosis present

## 2015-08-10 DIAGNOSIS — F419 Anxiety disorder, unspecified: Secondary | ICD-10-CM | POA: Diagnosis present

## 2015-08-10 DIAGNOSIS — Z8249 Family history of ischemic heart disease and other diseases of the circulatory system: Secondary | ICD-10-CM | POA: Diagnosis not present

## 2015-08-10 DIAGNOSIS — O99334 Smoking (tobacco) complicating childbirth: Secondary | ICD-10-CM | POA: Diagnosis present

## 2015-08-10 DIAGNOSIS — O99344 Other mental disorders complicating childbirth: Secondary | ICD-10-CM | POA: Diagnosis present

## 2015-08-10 DIAGNOSIS — R569 Unspecified convulsions: Secondary | ICD-10-CM | POA: Diagnosis present

## 2015-08-10 DIAGNOSIS — O9902 Anemia complicating childbirth: Secondary | ICD-10-CM | POA: Diagnosis present

## 2015-08-10 DIAGNOSIS — O34219 Maternal care for unspecified type scar from previous cesarean delivery: Secondary | ICD-10-CM | POA: Diagnosis present

## 2015-08-10 DIAGNOSIS — Z98891 History of uterine scar from previous surgery: Secondary | ICD-10-CM

## 2015-08-10 DIAGNOSIS — F988 Other specified behavioral and emotional disorders with onset usually occurring in childhood and adolescence: Secondary | ICD-10-CM | POA: Diagnosis present

## 2015-08-10 DIAGNOSIS — O99824 Streptococcus B carrier state complicating childbirth: Secondary | ICD-10-CM | POA: Diagnosis present

## 2015-08-10 DIAGNOSIS — Z3482 Encounter for supervision of other normal pregnancy, second trimester: Secondary | ICD-10-CM

## 2015-08-10 DIAGNOSIS — O09899 Supervision of other high risk pregnancies, unspecified trimester: Secondary | ICD-10-CM

## 2015-08-10 DIAGNOSIS — O133 Gestational [pregnancy-induced] hypertension without significant proteinuria, third trimester: Secondary | ICD-10-CM | POA: Diagnosis present

## 2015-08-10 DIAGNOSIS — R51 Headache: Secondary | ICD-10-CM | POA: Diagnosis present

## 2015-08-10 DIAGNOSIS — Z87898 Personal history of other specified conditions: Secondary | ICD-10-CM | POA: Diagnosis not present

## 2015-08-10 DIAGNOSIS — O134 Gestational [pregnancy-induced] hypertension without significant proteinuria, complicating childbirth: Secondary | ICD-10-CM | POA: Diagnosis present

## 2015-08-10 DIAGNOSIS — O1493 Unspecified pre-eclampsia, third trimester: Secondary | ICD-10-CM | POA: Diagnosis present

## 2015-08-10 DIAGNOSIS — R110 Nausea: Secondary | ICD-10-CM | POA: Diagnosis present

## 2015-08-10 HISTORY — DX: Other psychoactive substance abuse, uncomplicated: F19.10

## 2015-08-10 HISTORY — DX: Cannabis use, unspecified, in remission: F12.91

## 2015-08-10 HISTORY — DX: Nicotine dependence, unspecified, uncomplicated: F17.200

## 2015-08-10 HISTORY — DX: History of uterine scar from previous surgery: Z98.891

## 2015-08-10 HISTORY — DX: Personal history of gestational diabetes: Z86.32

## 2015-08-10 HISTORY — DX: Other specified postprocedural states: Z98.890

## 2015-08-10 HISTORY — DX: Personal history of other specified conditions: Z87.898

## 2015-08-10 HISTORY — DX: Personal history of other mental and behavioral disorders: Z86.59

## 2015-08-10 LAB — COMPREHENSIVE METABOLIC PANEL
ALT: 13 U/L — AB (ref 14–54)
AST: 18 U/L (ref 15–41)
Albumin: 3.5 g/dL (ref 3.5–5.0)
Alkaline Phosphatase: 90 U/L (ref 38–126)
Anion gap: 5 (ref 5–15)
BUN: 10 mg/dL (ref 6–20)
CALCIUM: 9 mg/dL (ref 8.9–10.3)
CO2: 24 mmol/L (ref 22–32)
CREATININE: 0.44 mg/dL (ref 0.44–1.00)
Chloride: 105 mmol/L (ref 101–111)
Glucose, Bld: 72 mg/dL (ref 65–99)
Potassium: 3.8 mmol/L (ref 3.5–5.1)
Sodium: 134 mmol/L — ABNORMAL LOW (ref 135–145)
TOTAL PROTEIN: 7.2 g/dL (ref 6.5–8.1)
Total Bilirubin: 0.6 mg/dL (ref 0.3–1.2)

## 2015-08-10 LAB — URINALYSIS, ROUTINE W REFLEX MICROSCOPIC
Bilirubin Urine: NEGATIVE
Glucose, UA: NEGATIVE mg/dL
Hgb urine dipstick: NEGATIVE
Ketones, ur: NEGATIVE mg/dL
LEUKOCYTES UA: NEGATIVE
NITRITE: NEGATIVE
PROTEIN: NEGATIVE mg/dL
UROBILINOGEN UA: 0.2 mg/dL (ref 0.0–1.0)
pH: 6 (ref 5.0–8.0)

## 2015-08-10 LAB — POCT URINALYSIS DIP (DEVICE)
Bilirubin Urine: NEGATIVE
Glucose, UA: NEGATIVE mg/dL
Hgb urine dipstick: NEGATIVE
Ketones, ur: NEGATIVE mg/dL
LEUKOCYTES UA: NEGATIVE
NITRITE: NEGATIVE
Protein, ur: NEGATIVE mg/dL
UROBILINOGEN UA: 0.2 mg/dL (ref 0.0–1.0)
pH: 7 (ref 5.0–8.0)

## 2015-08-10 LAB — CBC
HEMATOCRIT: 31.4 % — AB (ref 36.0–46.0)
Hemoglobin: 10.4 g/dL — ABNORMAL LOW (ref 12.0–15.0)
MCH: 29.6 pg (ref 26.0–34.0)
MCHC: 33.1 g/dL (ref 30.0–36.0)
MCV: 89.5 fL (ref 78.0–100.0)
Platelets: 207 10*3/uL (ref 150–400)
RBC: 3.51 MIL/uL — AB (ref 3.87–5.11)
RDW: 12.8 % (ref 11.5–15.5)
WBC: 12.7 10*3/uL — AB (ref 4.0–10.5)

## 2015-08-10 LAB — PROTEIN / CREATININE RATIO, URINE
CREATININE, URINE: 200 mg/dL
Protein Creatinine Ratio: 0.06 mg/mg{Cre} (ref 0.00–0.15)
Total Protein, Urine: 11 mg/dL

## 2015-08-10 MED ORDER — ONDANSETRON HCL 4 MG/2ML IJ SOLN: 4.0000 mg | Freq: Four times a day (QID) | INTRAMUSCULAR | Status: DC | PRN

## 2015-08-10 MED ORDER — OXYTOCIN 40 UNITS IN LACTATED RINGERS INFUSION - SIMPLE MED: 62.5000 mL/h | INTRAVENOUS | Status: DC

## 2015-08-10 MED ORDER — PHENYLEPHRINE 40 MCG/ML (10ML) SYRINGE FOR IV PUSH (FOR BLOOD PRESSURE SUPPORT)
80.0000 ug | PREFILLED_SYRINGE | INTRAVENOUS | Status: DC | PRN
  Filled 2015-08-10: qty 20

## 2015-08-10 MED ORDER — LIDOCAINE HCL (PF) 1 % IJ SOLN: 30.0000 mL | INTRAMUSCULAR | Status: DC | PRN

## 2015-08-10 MED ORDER — FENTANYL CITRATE (PF) 100 MCG/2ML IJ SOLN: 50.0000 ug | INTRAMUSCULAR | Status: DC | PRN

## 2015-08-10 MED ORDER — EPHEDRINE 5 MG/ML INJ: 10.0000 mg | INTRAVENOUS | Status: DC | PRN

## 2015-08-10 MED ORDER — DEXTROSE 5 % IV SOLN
5.0000 10*6.[IU] | Freq: Once | INTRAVENOUS | Status: AC
  Administered 2015-08-10: 5 10*6.[IU] via INTRAVENOUS
  Filled 2015-08-10: qty 5

## 2015-08-10 MED ORDER — OXYTOCIN BOLUS FROM INFUSION: 500.0000 mL | INTRAVENOUS | Status: DC

## 2015-08-10 MED ORDER — LACTATED RINGERS IV SOLN: 500.0000 mL | INTRAVENOUS | Status: DC | PRN

## 2015-08-10 MED ORDER — LACTATED RINGERS IV SOLN
INTRAVENOUS | Status: DC
  Administered 2015-08-10 – 2015-08-11 (×2): via INTRAVENOUS

## 2015-08-10 MED ORDER — FENTANYL 2.5 MCG/ML BUPIVACAINE 1/10 % EPIDURAL INFUSION (WH - ANES)
14.0000 mL/h | INTRAMUSCULAR | Status: DC | PRN
  Administered 2015-08-11: 14 mL/h via EPIDURAL
  Filled 2015-08-10: qty 125

## 2015-08-10 MED ORDER — CITRIC ACID-SODIUM CITRATE 334-500 MG/5ML PO SOLN: 30.0000 mL | ORAL | Status: DC | PRN

## 2015-08-10 MED ORDER — PENICILLIN G POTASSIUM 5000000 UNITS IJ SOLR
2.5000 10*6.[IU] | INTRAVENOUS | Status: DC
  Administered 2015-08-11: 2.5 10*6.[IU] via INTRAVENOUS
  Filled 2015-08-10 (×5): qty 2.5

## 2015-08-10 MED ORDER — FLEET ENEMA 7-19 GM/118ML RE ENEM: 1.0000 | ENEMA | RECTAL | Status: DC | PRN

## 2015-08-10 MED ORDER — OXYTOCIN 40 UNITS IN LACTATED RINGERS INFUSION - SIMPLE MED
1.0000 m[IU]/min | INTRAVENOUS | Status: DC
  Administered 2015-08-10: 1 m[IU]/min via INTRAVENOUS
  Administered 2015-08-11: 2 m[IU]/min via INTRAVENOUS
  Administered 2015-08-11: 4 m[IU]/min via INTRAVENOUS
  Filled 2015-08-10: qty 1000

## 2015-08-10 MED ORDER — DIPHENHYDRAMINE HCL 50 MG/ML IJ SOLN: 12.5000 mg | INTRAMUSCULAR | Status: DC | PRN

## 2015-08-10 MED ORDER — OXYCODONE-ACETAMINOPHEN 5-325 MG PO TABS: 2.0000 | ORAL_TABLET | ORAL | Status: DC | PRN

## 2015-08-10 MED ORDER — ACETAMINOPHEN 325 MG PO TABS
650.0000 mg | ORAL_TABLET | ORAL | Status: DC | PRN
  Administered 2015-08-11: 650 mg via ORAL
  Filled 2015-08-10: qty 2

## 2015-08-10 MED ORDER — TERBUTALINE SULFATE 1 MG/ML IJ SOLN
0.2500 mg | Freq: Once | INTRAMUSCULAR | Status: DC | PRN
Start: 2015-08-10 — End: 2015-08-11
  Filled 2015-08-10: qty 1

## 2015-08-10 MED ORDER — OXYCODONE-ACETAMINOPHEN 5-325 MG PO TABS: 1.0000 | ORAL_TABLET | ORAL | Status: DC | PRN

## 2015-08-10 NOTE — Patient Instructions (Addendum)
Vaginal Delivery °During delivery, your health care provider will help you give birth to your baby. During a vaginal delivery, you will work to push the baby out of your vagina. However, before you can push your baby out, a few things need to happen. The opening of your uterus (cervix) has to soften, thin out, and open up (dilate) all the way to 10 cm. Also, your baby has to move down from the uterus into your vagina.  °SIGNS OF LABOR  °Your health care provider will first need to make sure you are in labor. Signs of labor include:  °· Passing what is called the mucous plug before labor begins. This is a small amount of blood-stained mucus. °· Having regular, painful uterine contractions.   °· The time between contractions gets shorter.   °· The discomfort and pain gradually get more intense. °· Contraction pains get worse when walking and do not go away when resting.   °· Your cervix becomes thinner (effacement) and dilates. °BEFORE THE DELIVERY °Once you are in labor and admitted into the hospital or care center, your health care provider may do the following:  °· Perform a complete physical exam. °· Review any complications related to pregnancy or labor.  °· Check your blood pressure, pulse, temperature, and heart rate (vital signs).   °· Determine if, and when, the rupture of amniotic membranes occurred. °· Do a vaginal exam (using a sterile glove and lubricant) to determine:   °¨ The position (presentation) of the baby. Is the baby's head presenting first (vertex) in the birth canal (vagina), or are the feet or buttocks first (breech)?   °¨ The level (station) of the baby's head within the birth canal.   °¨ The effacement and dilatation of the cervix.   °· An electronic fetal monitor is usually placed on your abdomen when you first arrive. This is used to monitor your contractions and the baby's heart rate. °¨ When the monitor is on your abdomen (external fetal monitor), it can only pick up the frequency and  length of your contractions. It cannot tell the strength of your contractions. °¨ If it becomes necessary for your health care provider to know exactly how strong your contractions are or to see exactly what the baby's heart rate is doing, an internal monitor may be inserted into your vagina and uterus. Your health care provider will discuss the benefits and risks of using an internal monitor and obtain your permission before inserting the device. °¨ Continuous fetal monitoring may be needed if you have an epidural, are receiving certain medicines (such as oxytocin), or have pregnancy or labor complications. °· An IV access tube may be placed into a vein in your arm to deliver fluids and medicines if necessary. °THREE STAGES OF LABOR AND DELIVERY °Normal labor and delivery is divided into three stages. °First Stage °This stage starts when you begin to contract regularly and your cervix begins to efface and dilate. It ends when your cervix is completely open (fully dilated). The first stage is the longest stage of labor and can last from 3 hours to 15 hours.  °Several methods are available to help with labor pain. You and your health care provider will decide which option is best for you. Options include:  °· Opioid medicines. These are strong pain medicines that you can get through your IV tube or as a shot into your muscle. These medicines lessen pain but do not make it go away completely.  °· Epidural. A medicine is given through a thin tube that   is inserted in your back. The medicine numbs the lower part of your body and prevents any pain in that area. °· Paracervical pain medicine. This is an injection of an anesthetic on each side of your cervix.   °· You may request natural childbirth, which does not involve the use of pain medicines or an epidural during labor and delivery. Instead, you will use other things, such as breathing exercises, to help cope with the pain. °Second Stage °The second stage of labor  begins when your cervix is fully dilated at 10 cm. It continues until you push your baby down through the birth canal and the baby is born. This stage can take only minutes or several hours. °· The location of your baby's head as it moves through the birth canal is reported as a number called a station. If the baby's head has not started its descent, the station is described as being at minus 3 (-3). When your baby's head is at the zero station, it is at the middle of the birth canal and is engaged in the pelvis. The station of your baby helps indicate the progress of the second stage of labor. °· When your baby is born, your health care provider may hold the baby with his or her head lowered to prevent amniotic fluid, mucus, and blood from getting into the baby's lungs. The baby's mouth and nose may be suctioned with a small bulb syringe to remove any additional fluid. °· Your health care provider may then place the baby on your stomach. It is important to keep the baby from getting cold. To do this, the health care provider will dry the baby off, place the baby directly on your skin (with no blankets between you and the baby), and cover the baby with warm, dry blankets.   °· The umbilical cord is cut. °Third Stage °During the third stage of labor, your health care provider will deliver the placenta (afterbirth) and make sure your bleeding is under control. The delivery of the placenta usually takes about 5 minutes but can take up to 30 minutes. After the placenta is delivered, a medicine may be given either by IV or injection to help contract the uterus and control bleeding. If you are planning to breastfeed, you can try to do so now. °After you deliver the placenta, your uterus should contract and get very firm. If your uterus does not remain firm, your health care provider will massage it. This is important because the contraction of the uterus helps cut off bleeding at the site where the placenta was attached  to your uterus. If your uterus does not contract properly and stay firm, you may continue to bleed heavily. If there is a lot of bleeding, medicines may be given to contract the uterus and stop the bleeding.  °  °This information is not intended to replace advice given to you by your health care provider. Make sure you discuss any questions you have with your health care provider. °  °Document Released: 07/17/2008 Document Revised: 10/29/2014 Document Reviewed: 06/04/2012 °Elsevier Interactive Patient Education ©2016 [employer] ° °
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
## 2015-08-10 NOTE — Progress Notes (Signed)
Labor Progress Note  Stacy NashDesiree M Moss is a 24 y.o. [phone] at 5327w3d  admitted for induction of labor due to Hypertension.  S: Patient feeling a lot of pressure but not regular contractions. Had felt cramping when Foley Bulb was in place.   O:  BP 134/64 mmHg  Pulse 83  Temp(Src) 97.6 F (36.4 C) (Oral)  Resp 18  Ht 5\' 9"  (1.753 m)  Wt 102.694 kg (226 lb 6.4 oz)  BMI 33.42 kg/m2  SpO2 100%  LMP 10/22/2014 (Approximate)    FHT:  FHR: 140 bpm, variability: moderate,  accelerations:  Present,  decelerations:  Absent UC:   regular, every 8-10 minutes SVE:   Dilation: 3 Effacement (%): 60 Station: -3 Exam by:: E. Johnson,RN  SROM/AROM: Intact  Labs: Lab Results  Component Value Date   WBC 12.7* 08/10/2015   HGB 10.4* 08/10/2015   HCT 31.4* 08/10/2015   MCV 89.5 08/10/2015   PLT 207 08/10/2015    Assessment / Plan: 24 y.o. [id number] 3827w3d admitted for IOL 2/2 gHTN in early labor Induction of labor due to gestational hypertension, now s/p FB  Labor: FB out at ~2245. Will augment with pitocin 1x1.  Fetal Wellbeing:  Category I Pain Control:  Wants to try to not have an epidural but expects that she will want it once further along (had for her other two children) Anticipated MOD:  NSVD   Dani GobbleHillary Fitzgerald, MD [facility], PGY-1

## 2015-08-10 NOTE — Progress Notes (Signed)
Subjective:  Garner NashDesiree M Schmoll is a 24 y.o. [phone] at 4436w3d being seen today for ongoing prenatal care.  Patient reports headache and seeing black spots and flashes since esterday.  Contractions: Not present.  Vag. Bleeding: None. Movement: Present. Denies leaking of fluid.   The following portions of the patient's history were reviewed and updated as appropriate: allergies, current medications, past family history, past medical history, past social history, past surgical history and problem list. Problem list updated.  Objective:   Filed Vitals:   08/10/15 1127  BP: 143/87  Pulse: 75  Temp: 98.5 F (36.9 C)  Weight: 225 lb 12.8 oz (102.422 kg)    Fetal Status: Fetal Heart Rate (bpm): 155   Movement: Present     General:  Alert, oriented and cooperative. Patient is in no acute distress.  Skin: Skin is warm and dry. No rash noted.   Cardiovascular: Normal heart rate noted  Respiratory: Normal respiratory effort, no problems with respiration noted  Abdomen: Soft, gravid, appropriate for gestational age. Pain/Pressure: Present     Pelvic: Vag. Bleeding: None Vag D/C Character: Mucous   Cervical exam deferred        Extremities: Normal range of motion.  Edema: Trace  Mental Status: Normal mood and affect. Normal behavior. Normal judgment and thought content.   Urinalysis: Urine Protein: Negative Urine Glucose: Negative  Assessment and Plan:  Pregnancy: [id number] at 6836w3d  1. Encounter for supervision of other normal pregnancy in second trimester   2. Group B Streptococcus carrier, antepartum   Term labor symptoms and general obstetric precautions including but not limited to vaginal bleeding, contractions, leaking of fluid and fetal movement were reviewed in detail with the patient. Please refer to After Visit Summary for other counseling recommendations.  To MAU for Pre-E work-up   Dorathy KinsmanVirginia Joffrey Kerce, CNM

## 2015-08-10 NOTE — Progress Notes (Signed)
Breastfeeding Tip of the Week reviewed.  Headaches for 1 week - severe.  Seeing sparkles.  Blurry vision also.

## 2015-08-10 NOTE — MAU Note (Signed)
Patient states she was seen down in clinic and sent up to MAU for PIH workup.  BP was 135/76.  She is having a headache and "I'm seeing spots."

## 2015-08-11 ENCOUNTER — Inpatient Hospital Stay (HOSPITAL_COMMUNITY): Payer: Medicaid Other | Admitting: Anesthesiology

## 2015-08-11 ENCOUNTER — Encounter (HOSPITAL_COMMUNITY)
Admission: AD | Disposition: A | Discharge status: Home / Self Care | Payer: Self-pay | Source: Ambulatory Visit | Attending: Obstetrics & Gynecology

## 2015-08-11 ENCOUNTER — Encounter (HOSPITAL_COMMUNITY): Payer: Self-pay

## 2015-08-11 DIAGNOSIS — O134 Gestational [pregnancy-induced] hypertension without significant proteinuria, complicating childbirth: Secondary | ICD-10-CM

## 2015-08-11 DIAGNOSIS — O99824 Streptococcus B carrier state complicating childbirth: Secondary | ICD-10-CM

## 2015-08-11 DIAGNOSIS — O1493 Unspecified pre-eclampsia, third trimester: Secondary | ICD-10-CM | POA: Diagnosis present

## 2015-08-11 DIAGNOSIS — Z3A37 37 weeks gestation of pregnancy: Secondary | ICD-10-CM

## 2015-08-11 DIAGNOSIS — Z98891 History of uterine scar from previous surgery: Secondary | ICD-10-CM

## 2015-08-11 LAB — DIC (DISSEMINATED INTRAVASCULAR COAGULATION) PANEL
APTT: 32 s (ref 24–37)
D DIMER QUANT: 2.2 ug{FEU}/mL — AB (ref 0.00–0.48)
FIBRINOGEN: 371 mg/dL (ref 204–475)
PLATELETS: 193 10*3/uL (ref 150–400)
SMEAR REVIEW: NONE SEEN

## 2015-08-11 LAB — RPR: RPR Ser Ql: NONREACTIVE

## 2015-08-11 LAB — DIC (DISSEMINATED INTRAVASCULAR COAGULATION)PANEL
INR: 0.98 (ref 0.00–1.49)
Prothrombin Time: 13.2 seconds (ref 11.6–15.2)

## 2015-08-11 LAB — CBC
HEMATOCRIT: 27.9 % — AB (ref 36.0–46.0)
HEMOGLOBIN: 9.4 g/dL — AB (ref 12.0–15.0)
MCH: 29.8 pg (ref 26.0–34.0)
MCHC: 33.7 g/dL (ref 30.0–36.0)
MCV: 88.6 fL (ref 78.0–100.0)
Platelets: 217 10*3/uL (ref 150–400)
RBC: 3.15 MIL/uL — AB (ref 3.87–5.11)
RDW: 12.9 % (ref 11.5–15.5)
WBC: 12.7 10*3/uL — AB (ref 4.0–10.5)

## 2015-08-11 LAB — HEMOGLOBIN AND HEMATOCRIT, BLOOD
HCT: 25.2 % — ABNORMAL LOW (ref 36.0–46.0)
Hemoglobin: 8.4 g/dL — ABNORMAL LOW (ref 12.0–15.0)

## 2015-08-11 LAB — HIV ANTIBODY (ROUTINE TESTING W REFLEX): HIV Screen 4th Generation wRfx: NONREACTIVE

## 2015-08-11 LAB — MASSIVE TRANSFUSION PROTOCOL ORDER (BLOOD BANK NOTIFICATION)

## 2015-08-11 SURGERY — Surgical Case
Anesthesia: Epidural

## 2015-08-11 MED ORDER — PROMETHAZINE HCL 25 MG/ML IJ SOLN: 6.2500 mg | INTRAMUSCULAR | Status: DC | PRN

## 2015-08-11 MED ORDER — SCOPOLAMINE 1 MG/3DAYS TD PT72
MEDICATED_PATCH | TRANSDERMAL | Status: AC
  Filled 2015-08-11: qty 1

## 2015-08-11 MED ORDER — LIDOCAINE-EPINEPHRINE (PF) 2 %-1:200000 IJ SOLN
INTRAMUSCULAR | Status: AC
  Filled 2015-08-11: qty 20

## 2015-08-11 MED ORDER — LACTATED RINGERS IV SOLN
INTRAVENOUS | Status: DC | PRN
  Administered 2015-08-11 (×5): via INTRAVENOUS

## 2015-08-11 MED ORDER — OXYTOCIN 40 UNITS IN LACTATED RINGERS INFUSION - SIMPLE MED: 62.5000 mL/h | INTRAVENOUS | Status: AC

## 2015-08-11 MED ORDER — OXYCODONE-ACETAMINOPHEN 5-325 MG PO TABS
2.0000 | ORAL_TABLET | ORAL | Status: DC | PRN
  Administered 2015-08-11 – 2015-08-14 (×11): 2 via ORAL
  Filled 2015-08-11 (×11): qty 2

## 2015-08-11 MED ORDER — NALBUPHINE HCL 10 MG/ML IJ SOLN: 5.0000 mg | Freq: Once | INTRAMUSCULAR | Status: DC | PRN

## 2015-08-11 MED ORDER — LIDOCAINE HCL (PF) 1 % IJ SOLN: 30.0000 mL | INTRAMUSCULAR | Status: DC | PRN

## 2015-08-11 MED ORDER — METHYLERGONOVINE MALEATE 0.2 MG PO TABS
0.2000 mg | ORAL_TABLET | ORAL | Status: DC | PRN
Start: 2015-08-11 — End: 2015-08-14

## 2015-08-11 MED ORDER — LACTATED RINGERS IV SOLN
INTRAVENOUS | Status: DC
  Administered 2015-08-11: 23:00:00 via INTRAVENOUS

## 2015-08-11 MED ORDER — SIMETHICONE 80 MG PO CHEW
80.0000 mg | CHEWABLE_TABLET | ORAL | Status: DC
  Administered 2015-08-14: 80 mg via ORAL
  Filled 2015-08-11 (×3): qty 1

## 2015-08-11 MED ORDER — PHENYLEPHRINE 40 MCG/ML (10ML) SYRINGE FOR IV PUSH (FOR BLOOD PRESSURE SUPPORT)
PREFILLED_SYRINGE | INTRAVENOUS | Status: AC
  Filled 2015-08-11: qty 10

## 2015-08-11 MED ORDER — MEPERIDINE HCL 25 MG/ML IJ SOLN: 6.2500 mg | INTRAMUSCULAR | Status: DC | PRN

## 2015-08-11 MED ORDER — KETOROLAC TROMETHAMINE 30 MG/ML IJ SOLN: 30.0000 mg | Freq: Once | INTRAMUSCULAR | Status: DC | PRN

## 2015-08-11 MED ORDER — NALOXONE HCL 0.4 MG/ML IJ SOLN: 0.4000 mg | INTRAMUSCULAR | Status: DC | PRN

## 2015-08-11 MED ORDER — SODIUM BICARBONATE 8.4 % IV SOLN
INTRAVENOUS | Status: DC | PRN
  Administered 2015-08-11 (×2): 10 mL via EPIDURAL

## 2015-08-11 MED ORDER — SODIUM CHLORIDE 0.9 % IV SOLN
10000.0000 ug | INTRAVENOUS | Status: DC | PRN
  Administered 2015-08-11: 60 ug/min via INTRAVENOUS

## 2015-08-11 MED ORDER — MORPHINE SULFATE (PF) 0.5 MG/ML IJ SOLN
INTRAMUSCULAR | Status: AC
  Filled 2015-08-11: qty 100

## 2015-08-11 MED ORDER — PRENATAL MULTIVITAMIN CH
1.0000 | ORAL_TABLET | Freq: Every day | ORAL | Status: DC
  Administered 2015-08-12 – 2015-08-14 (×3): 1 via ORAL
  Filled 2015-08-11 (×3): qty 1

## 2015-08-11 MED ORDER — SENNOSIDES-DOCUSATE SODIUM 8.6-50 MG PO TABS
2.0000 | ORAL_TABLET | ORAL | Status: DC
  Administered 2015-08-11 – 2015-08-14 (×3): 2 via ORAL
  Filled 2015-08-11 (×3): qty 2

## 2015-08-11 MED ORDER — LANOLIN HYDROUS EX OINT: 1.0000 "application " | TOPICAL_OINTMENT | CUTANEOUS | Status: DC | PRN

## 2015-08-11 MED ORDER — ACETAMINOPHEN 325 MG PO TABS: 650.0000 mg | ORAL_TABLET | ORAL | Status: DC | PRN

## 2015-08-11 MED ORDER — KETOROLAC TROMETHAMINE 30 MG/ML IJ SOLN: 30.0000 mg | Freq: Four times a day (QID) | INTRAMUSCULAR | Status: AC | PRN

## 2015-08-11 MED ORDER — ZOLPIDEM TARTRATE 5 MG PO TABS: 5.0000 mg | ORAL_TABLET | Freq: Every evening | ORAL | Status: DC | PRN

## 2015-08-11 MED ORDER — SCOPOLAMINE 1 MG/3DAYS TD PT72
MEDICATED_PATCH | TRANSDERMAL | Status: DC | PRN
  Administered 2015-08-11: 1 via TRANSDERMAL

## 2015-08-11 MED ORDER — LACTATED RINGERS IV SOLN: 500.0000 mL | INTRAVENOUS | Status: DC | PRN

## 2015-08-11 MED ORDER — SIMETHICONE 80 MG PO CHEW
80.0000 mg | CHEWABLE_TABLET | ORAL | Status: DC | PRN
  Administered 2015-08-11 – 2015-08-12 (×2): 80 mg via ORAL

## 2015-08-11 MED ORDER — OXYTOCIN 10 UNIT/ML IJ SOLN
40.0000 [IU] | INTRAMUSCULAR | Status: DC | PRN
  Administered 2015-08-11: 40 [IU] via INTRAVENOUS

## 2015-08-11 MED ORDER — DIPHENHYDRAMINE HCL 50 MG/ML IJ SOLN: 12.5000 mg | INTRAMUSCULAR | Status: DC | PRN

## 2015-08-11 MED ORDER — OXYCODONE-ACETAMINOPHEN 5-325 MG PO TABS: 2.0000 | ORAL_TABLET | ORAL | Status: DC | PRN

## 2015-08-11 MED ORDER — NALBUPHINE HCL 10 MG/ML IJ SOLN: 5.0000 mg | INTRAMUSCULAR | Status: DC | PRN

## 2015-08-11 MED ORDER — CITRIC ACID-SODIUM CITRATE 334-500 MG/5ML PO SOLN: 30.0000 mL | ORAL | Status: DC | PRN

## 2015-08-11 MED ORDER — DIPHENHYDRAMINE HCL 25 MG PO CAPS: 25.0000 mg | ORAL_CAPSULE | ORAL | Status: DC | PRN

## 2015-08-11 MED ORDER — SODIUM BICARBONATE 8.4 % IV SOLN
INTRAVENOUS | Status: AC
  Filled 2015-08-11: qty 50

## 2015-08-11 MED ORDER — PHENYLEPHRINE 8 MG IN D5W 100 ML (0.08MG/ML) PREMIX OPTIME
INJECTION | INTRAVENOUS | Status: AC
  Filled 2015-08-11: qty 100

## 2015-08-11 MED ORDER — SODIUM CHLORIDE 0.9 % IJ SOLN: 3.0000 mL | INTRAMUSCULAR | Status: DC | PRN

## 2015-08-11 MED ORDER — HYDROMORPHONE HCL 1 MG/ML IJ SOLN: 0.2500 mg | INTRAMUSCULAR | Status: DC | PRN

## 2015-08-11 MED ORDER — SIMETHICONE 80 MG PO CHEW
80.0000 mg | CHEWABLE_TABLET | Freq: Three times a day (TID) | ORAL | Status: DC
  Administered 2015-08-11 – 2015-08-14 (×10): 80 mg via ORAL
  Filled 2015-08-11 (×10): qty 1

## 2015-08-11 MED ORDER — FENTANYL CITRATE (PF) 100 MCG/2ML IJ SOLN
INTRAMUSCULAR | Status: AC
  Filled 2015-08-11: qty 4

## 2015-08-11 MED ORDER — TETANUS-DIPHTH-ACELL PERTUSSIS 5-2.5-18.5 LF-MCG/0.5 IM SUSP: 0.5000 mL | Freq: Once | INTRAMUSCULAR | Status: DC

## 2015-08-11 MED ORDER — METHYLERGONOVINE MALEATE 0.2 MG/ML IJ SOLN: 0.2000 mg | INTRAMUSCULAR | Status: DC | PRN

## 2015-08-11 MED ORDER — SODIUM CHLORIDE 0.9 % IV SOLN
INTRAVENOUS | Status: DC | PRN
  Administered 2015-08-11: 08:00:00 via INTRAVENOUS

## 2015-08-11 MED ORDER — OXYCODONE-ACETAMINOPHEN 5-325 MG PO TABS: 1.0000 | ORAL_TABLET | ORAL | Status: DC | PRN

## 2015-08-11 MED ORDER — CEFAZOLIN SODIUM-DEXTROSE 2-3 GM-% IV SOLR
INTRAVENOUS | Status: AC
  Filled 2015-08-11: qty 50

## 2015-08-11 MED ORDER — OXYCODONE-ACETAMINOPHEN 5-325 MG PO TABS
1.0000 | ORAL_TABLET | ORAL | Status: DC | PRN
  Administered 2015-08-14: 1 via ORAL
  Filled 2015-08-11: qty 1

## 2015-08-11 MED ORDER — ONDANSETRON HCL 4 MG/2ML IJ SOLN: 4.0000 mg | Freq: Four times a day (QID) | INTRAMUSCULAR | Status: DC | PRN

## 2015-08-11 MED ORDER — ONDANSETRON HCL 4 MG/2ML IJ SOLN
INTRAMUSCULAR | Status: AC
  Filled 2015-08-11: qty 2

## 2015-08-11 MED ORDER — FENTANYL CITRATE (PF) 100 MCG/2ML IJ SOLN
INTRAMUSCULAR | Status: DC | PRN
  Administered 2015-08-11: 100 ug via INTRAVENOUS

## 2015-08-11 MED ORDER — DIBUCAINE 1 % RE OINT: 1.0000 "application " | TOPICAL_OINTMENT | RECTAL | Status: DC | PRN

## 2015-08-11 MED ORDER — ACETAMINOPHEN 500 MG PO TABS
1000.0000 mg | ORAL_TABLET | Freq: Four times a day (QID) | ORAL | Status: AC
  Administered 2015-08-11: 1000 mg via ORAL
  Filled 2015-08-11: qty 2

## 2015-08-11 MED ORDER — PHENYLEPHRINE HCL 10 MG/ML IJ SOLN
INTRAMUSCULAR | Status: DC | PRN
  Administered 2015-08-11: 80 ug via INTRAVENOUS
  Administered 2015-08-11: 120 ug via INTRAVENOUS

## 2015-08-11 MED ORDER — ONDANSETRON HCL 4 MG/2ML IJ SOLN
INTRAMUSCULAR | Status: DC | PRN
  Administered 2015-08-11: 4 mg via INTRAVENOUS

## 2015-08-11 MED ORDER — OXYTOCIN 40 UNITS IN LACTATED RINGERS INFUSION - SIMPLE MED: 62.5000 mL/h | INTRAVENOUS | Status: DC

## 2015-08-11 MED ORDER — DIPHENHYDRAMINE HCL 25 MG PO CAPS: 25.0000 mg | ORAL_CAPSULE | Freq: Four times a day (QID) | ORAL | Status: DC | PRN

## 2015-08-11 MED ORDER — IBUPROFEN 600 MG PO TABS
600.0000 mg | ORAL_TABLET | Freq: Four times a day (QID) | ORAL | Status: DC | PRN
  Administered 2015-08-13: 600 mg via ORAL

## 2015-08-11 MED ORDER — DEXTROSE 5 % IV SOLN
1.0000 ug/kg/h | INTRAVENOUS | Status: DC | PRN
  Filled 2015-08-11: qty 2

## 2015-08-11 MED ORDER — LIDOCAINE HCL (PF) 1 % IJ SOLN
INTRAMUSCULAR | Status: DC | PRN
  Administered 2015-08-11: 2 mL via EPIDURAL
  Administered 2015-08-11: 3 mL via EPIDURAL
  Administered 2015-08-11: 5 mL via EPIDURAL

## 2015-08-11 MED ORDER — SCOPOLAMINE 1 MG/3DAYS TD PT72
1.0000 | MEDICATED_PATCH | Freq: Once | TRANSDERMAL | Status: DC
  Filled 2015-08-11: qty 1

## 2015-08-11 MED ORDER — DEXAMETHASONE SODIUM PHOSPHATE 4 MG/ML IJ SOLN
INTRAMUSCULAR | Status: DC | PRN
  Administered 2015-08-11: 4 mg via INTRAVENOUS

## 2015-08-11 MED ORDER — ACETAMINOPHEN 500 MG PO TABS: 500.0000 mg | ORAL_TABLET | Freq: Four times a day (QID) | ORAL | Status: DC | PRN

## 2015-08-11 MED ORDER — MORPHINE SULFATE (PF) 0.5 MG/ML IJ SOLN
INTRAMUSCULAR | Status: DC | PRN
  Administered 2015-08-11: 4 mg via EPIDURAL

## 2015-08-11 MED ORDER — METRONIDAZOLE 0.75 % VA GEL
1.0000 | Freq: Two times a day (BID) | VAGINAL | Status: DC
  Administered 2015-08-11: 1 via VAGINAL
  Filled 2015-08-11: qty 70

## 2015-08-11 MED ORDER — LACTATED RINGERS IV SOLN
INTRAVENOUS | Status: DC
Start: 2015-08-11 — End: 2015-08-11

## 2015-08-11 MED ORDER — IBUPROFEN 600 MG PO TABS
600.0000 mg | ORAL_TABLET | Freq: Four times a day (QID) | ORAL | Status: DC
  Administered 2015-08-11 – 2015-08-13 (×6): 600 mg via ORAL
  Filled 2015-08-11 (×7): qty 1

## 2015-08-11 MED ORDER — CEFAZOLIN SODIUM-DEXTROSE 2-3 GM-% IV SOLR
INTRAVENOUS | Status: DC | PRN
  Administered 2015-08-11: 2 g via INTRAVENOUS

## 2015-08-11 MED ORDER — WITCH HAZEL-GLYCERIN EX PADS: 1.0000 "application " | MEDICATED_PAD | CUTANEOUS | Status: DC | PRN

## 2015-08-11 MED ORDER — MENTHOL 3 MG MT LOZG: 1.0000 | LOZENGE | OROMUCOSAL | Status: DC | PRN

## 2015-08-11 MED ORDER — OXYTOCIN BOLUS FROM INFUSION: 500.0000 mL | INTRAVENOUS | Status: DC

## 2015-08-11 MED ORDER — LACTATED RINGERS IV SOLN
INTRAVENOUS | Status: DC | PRN
  Administered 2015-08-11: 07:00:00 via INTRAVENOUS

## 2015-08-11 MED ORDER — FLEET ENEMA 7-19 GM/118ML RE ENEM: 1.0000 | ENEMA | RECTAL | Status: DC | PRN

## 2015-08-11 MED ORDER — OXYTOCIN 10 UNIT/ML IJ SOLN
INTRAMUSCULAR | Status: AC
  Filled 2015-08-11: qty 4

## 2015-08-11 MED ORDER — ONDANSETRON HCL 4 MG/2ML IJ SOLN: 4.0000 mg | Freq: Three times a day (TID) | INTRAMUSCULAR | Status: DC | PRN

## 2015-08-11 SURGICAL SUPPLY — 35 items
CLAMP CORD UMBIL (MISCELLANEOUS) IMPLANT
CLOTH BEACON ORANGE TIMEOUT ST (SAFETY) ×3 IMPLANT
DRAPE SHEET LG 3/4 BI-LAMINATE (DRAPES) IMPLANT
DRSG OPSITE POSTOP 4X10 (GAUZE/BANDAGES/DRESSINGS) ×3 IMPLANT
DURAPREP 26ML APPLICATOR (WOUND CARE) ×6 IMPLANT
ELECT REM PT RETURN 9FT ADLT (ELECTROSURGICAL) ×3
ELECTRODE REM PT RTRN 9FT ADLT (ELECTROSURGICAL) ×1 IMPLANT
EXTRACTOR VACUUM BELL STYLE (SUCTIONS) IMPLANT
GLOVE BIOGEL PI IND STRL 8 (GLOVE) ×1 IMPLANT
GLOVE BIOGEL PI INDICATOR 8 (GLOVE) ×2
GLOVE ECLIPSE 8.0 STRL XLNG CF (GLOVE) ×3 IMPLANT
GOWN STRL REUS W/TWL LRG LVL3 (GOWN DISPOSABLE) ×6 IMPLANT
KIT ABG SYR 3ML LUER SLIP (SYRINGE) ×3 IMPLANT
LIQUID BAND (GAUZE/BANDAGES/DRESSINGS) ×6 IMPLANT
NEEDLE HYPO 18GX1.5 BLUNT FILL (NEEDLE) ×3 IMPLANT
NEEDLE HYPO 22GX1.5 SAFETY (NEEDLE) ×3 IMPLANT
NEEDLE HYPO 25X5/8 SAFETYGLIDE (NEEDLE) ×3 IMPLANT
NS IRRIG 1000ML POUR BTL (IV SOLUTION) ×3 IMPLANT
PACK C SECTION WH (CUSTOM PROCEDURE TRAY) ×3 IMPLANT
PAD OB MATERNITY 4.3X12.25 (PERSONAL CARE ITEMS) ×3 IMPLANT
PENCIL SMOKE EVAC W/HOLSTER (ELECTROSURGICAL) ×3 IMPLANT
RTRCTR C-SECT PINK 25CM LRG (MISCELLANEOUS) IMPLANT
STAPLER VISISTAT 35W (STAPLE) ×3 IMPLANT
SUT CHROMIC 0 CT 1 (SUTURE) ×3 IMPLANT
SUT MNCRL 0 VIOLET CTX 36 (SUTURE) ×5 IMPLANT
SUT MONOCRYL 0 CTX 36 (SUTURE) ×10
SUT PLAIN 2 0 (SUTURE)
SUT PLAIN 2 0 XLH (SUTURE) IMPLANT
SUT PLAIN ABS 2-0 CT1 27XMFL (SUTURE) IMPLANT
SUT VIC AB 0 CTX 36 (SUTURE) ×2
SUT VIC AB 0 CTX36XBRD ANBCTRL (SUTURE) ×1 IMPLANT
SUT VIC AB 4-0 KS 27 (SUTURE) IMPLANT
SYR 20CC LL (SYRINGE) ×6 IMPLANT
TOWEL OR 17X24 6PK STRL BLUE (TOWEL DISPOSABLE) ×3 IMPLANT
TRAY FOLEY CATH SILVER 14FR (SET/KITS/TRAYS/PACK) IMPLANT

## 2015-08-11 NOTE — Progress Notes (Signed)
Pt complained of intense pain in lower abdomen. Perform sterile vaginal exam Unable to determine presentation. Called for additional nursing staff to assist

## 2015-08-11 NOTE — Transfer of Care (Signed)
Immediate Anesthesia Transfer of Care Note  Patient: Stacy Moss  Procedure(s) Performed: Procedure(s): CESAREAN SECTION (N/A)  Patient Location: PACU  Anesthesia Type:Epidural  Level of Consciousness: awake, alert  and oriented  Airway & Oxygen Therapy: Patient Spontanous Breathing  Post-op Assessment: Report given to RN and Post -op Vital signs reviewed and stable  Post vital signs: Reviewed and stable  Last Vitals:  Filed Vitals:   08/11/15 0630  BP: 147/75  Pulse: 87  Temp:   Resp: 18    Complications: No apparent anesthesia complications

## 2015-08-11 NOTE — Progress Notes (Signed)
Multiple nurses at bedside for assistance, CNM and MD at bedside. Agreed STAT c/s was necessary. Transport to OR via bed.

## 2015-08-11 NOTE — Anesthesia Postprocedure Evaluation (Signed)
Anesthesia Post Note  Patient: Stacy Moss  Procedure(s) Performed: Procedure(s) (LRB): CESAREAN SECTION (N/A)  Anesthesia type: Epidural  Patient location: PACU  Post pain: Pain level controlled  Post assessment: Post-op Vital signs reviewed  Last Vitals:  Filed Vitals:   08/11/15 1444  BP: 128/71  Pulse: 69  Temp: 36.9 C  Resp: 20    Post vital signs: Reviewed  Level of consciousness: awake  Complications: No apparent anesthesia complications

## 2015-08-11 NOTE — Anesthesia Procedure Notes (Signed)
Epidural Patient location during procedure: OB  Staffing Anesthesiologist: Jaylenn Baiza EDWARD Performed by: anesthesiologist   Preanesthetic Checklist Completed: patient identified, pre-op evaluation, timeout performed, IV checked, risks and benefits discussed and monitors and equipment checked  Epidural Patient position: sitting Prep: DuraPrep Patient monitoring: blood pressure and continuous pulse ox Approach: midline Location: L3-L4 Injection technique: LOR air  Needle:  Needle type: Tuohy  Needle gauge: 17 G Needle length: 9 cm Needle insertion depth: 7 cm Catheter size: 19 Gauge Catheter at skin depth: 12 cm Test dose: negative and Other (1% Lidocaine)  Additional Notes Patient identified.  Risk benefits discussed including failed block, incomplete pain control, headache, nerve damage, paralysis, blood pressure changes, nausea, vomiting, reactions to medication both toxic or allergic, and postpartum back pain.  Patient expressed understanding and wished to proceed.  All questions were answered.  Sterile technique used throughout procedure and epidural site dressed with sterile barrier dressing. No paresthesia or other complications noted. The patient did not experience any signs of intravascular injection such as tinnitus or metallic taste in mouth nor signs of intrathecal spread such as rapid motor block. Please see nursing notes for vital signs. Reason for block:procedure for pain   

## 2015-08-11 NOTE — Anesthesia Preprocedure Evaluation (Addendum)
Anesthesia Evaluation  Patient identified by MRN, date of birth, ID band Patient awake    Reviewed: Allergy & Precautions, NPO status , Patient's Chart, lab work & pertinent test results  History of Anesthesia Complications Negative for: history of anesthetic complications  Airway Mallampati: II  TM Distance: >3 FB Neck ROM: Full    Dental  (+) Teeth Intact, Dental Advisory Given   Pulmonary Current Smoker,    Pulmonary exam normal        Cardiovascular hypertension, Normal cardiovascular exam     Neuro/Psych Seizures -,  PSYCHIATRIC DISORDERS Anxiety Depression Bipolar Disorder    GI/Hepatic negative GI ROS, (+)     substance abuse  ,   Endo/Other  Obesity   Renal/GU negative Renal ROS  negative genitourinary   Musculoskeletal negative musculoskeletal ROS (+)   Abdominal   Peds negative pediatric ROS (+)  Hematology  (+) Blood dyscrasia, anemia ,   Anesthesia Other Findings   Reproductive/Obstetrics (+) Pregnancy                           Anesthesia Physical Anesthesia Plan  ASA: III and emergent  Anesthesia Plan: Epidural   Post-op Pain Management:    Induction:   Airway Management Planned:   Additional Equipment:   Intra-op Plan:   Post-operative Plan:   Informed Consent: I have reviewed the patients History and Physical, chart, labs and discussed the procedure including the risks, benefits and alternatives for the proposed anesthesia with the patient or authorized representative who has indicated his/her understanding and acceptance.   Dental advisory given  Plan Discussed with: CRNA and Surgeon  Anesthesia Plan Comments: (Patient identified. Risks/Benefits/Options discussed with patient including but not limited to bleeding, infection, nerve damage, paralysis, failed block, incomplete pain control, headache, blood pressure changes, nausea, vomiting, reactions to  medication both or allergic, itching and postpartum back pain. Confirmed with bedside nurse the patient's most recent platelet count. Confirmed with patient that they are not currently taking any anticoagulation, have any bleeding history or any family history of bleeding disorders. Patient expressed understanding and wished to proceed. All questions were answered.  For stat C/S for uterine rupture with functioning epidural in place and dosed.)       Anesthesia Quick Evaluation

## 2015-08-11 NOTE — Lactation Note (Signed)
This note was copied from the chart of Stacy Raiford Simmondsesiree Jenson. Lactation Consultation Note      Initial consult with this mom of a 37 4/7 week baby, born after mom  Had a ruptured uterus, and baby was found at c-section to be floating in her abdomen. The baby had apgars of 2/4/and 5. Mom wants oto provide EBM, so I started her pumping with DEP, reviewed hand expression, and mom was able to express some drops of colostrum. NICU booklet reviewed, as well as lactation services. Mom encouraged to do skin to skin when she is able to go to NICU. Mom knows to call for questions/concerns.   Patient Name: Stacy Moss     Maternal Data    Feeding    LATCH Score/Interventions                      Lactation Tools Discussed/Used     Consult Status      Alfred LevinsLee, Theona Muhs Anne Moss, 3:46 PM

## 2015-08-11 NOTE — Anesthesia Postprocedure Evaluation (Signed)
  Anesthesia Post-op Note  Patient: Stacy Moss  Procedure(s) Performed: Procedure(s): CESAREAN SECTION (N/A)  Patient Location: [facility]  Anesthesia Type:Epidural  Level of Consciousness: awake, alert  and oriented  Airway and Oxygen Therapy: Patient Spontanous Breathing  Post-op Pain: none  Post-op Assessment: Post-op Vital signs reviewed and Patient's Cardiovascular Status Stable              Post-op Vital Signs: Reviewed and stable  Last Vitals:  Filed Vitals:   08/11/15 1444  BP: 128/71  Pulse: 69  Temp: 36.9 C  Resp: 20    Complications: No apparent anesthesia complications

## 2015-08-11 NOTE — Op Note (Signed)
Preoperative diagnosis:  1.  Intrauterine pregnancy at 3030w4d  weeks gestation                                         2.  Previous Caesarean section with subsequent successful VBAC                                         3.  Pre eclampsia with severe features(CNS)                                         4.  Uterine rupture   Postoperative diagnosis:  Same as above plus tear through the right parametrial vessels under the bladder from the uterine rupture  Procedure: Repeat emergency cesarean section  Surgeon:  Lazaro ArmsLuther H Eure MD  Assistant:    Anesthesia: Epidural  Findings:   The patient was undergoing induction of labor and the fetal heart rate had not been an issue at all, reassuring throughout.  She experienced spontaneous rupture of membranes at 0644.  She had 2 variables associated with this with good return and reactivity.  However 0 653 there was a loss of the ability to get the fetal heart rate.  I was called and entered the room at 0 656.  I examined the patient and there was no fetal part in the pelvis and palpated her abdomen and the fetus seemed to be in the right upper quadrant.  At 0 659 I called a code cesarean section for uterine rupture.  The patient left the room at 0701 and the baby was delivered at 0799    The fetus umbilical cord and placenta were in the right upper quadrant and upper abdomen.  The uterus was completely ruptured from the right lower part of the lower uterine segment underneath the bladder and the parametrial space at a 45 angle up to the left upper portion of the uterus.  The baby was delivered using a vacuum because I could not get the baby down out of the right upper quadrant any other way.  A cord gas was obtained in the pH was 6.86.   was delivered a viable female with Apgars of 2 and 4 and 5 at 10 minutes weighing 5 lbs. 14 oz. Uterus, tubes and ovaries were all normal.  There were no other significant findings  Description of operation:  Patient was  taken to the operating room in emergent fashion as stated as above.  She had a labor epidural placed previously.  Dr. Arby BarretteHatchett was present and I made sure that she had an adequate anesthetic level as far as he was concerned.  She did.  She was prepped and draped in the usual sterile fashion but again emergently.  A count was done preoperatively. a Foley catheter was already placed. A Pfannenstiel skin incision was made and carried down sharply to the rectus fascia which was scored in the midline extended laterally. The fascia was taken off the muscles both superiorly and without difficulty. The muscles were divided.  The peritoneal cavity was entered.  Again to reiterate the findings stated as above the uterus was already involuted and was ruptured with a very large defect going from  the lower right portion of the lower uterine segment and uterus again transecting the right parametrial vessels all the way up to the left upper quadrant of the uterus .  The fetus umbilical cord and placenta were all free floating in the abdomen specifically in the right upper quadrant.  I delivered delivered a viable female  infant at 62 with Apgars of 2 and 4 and 5 weighing5 lbs 14 oz.  Cord pH was obtained and was 6.86  Dr. Eric Form was present for fetal resuscitation. The uterus was exteriorized.  There were vessels bleeding in the right parametrium really down low uterine segment more paracervical really the ascending branch of the uterine artery cord anastomosis with the vaginal arteries.  3 interrupted sutures were placed to gain hemostasis of this area .  The uterus itself was relatively hemostatic.  It was closed in 2 layers, the first being a running interlocking layer and the second being an imbricating layer using 0 monocryl on a CTX needle.  There was good resulting hemostasis.  Special attention was paid beneath the bladder on the right parametrial vessels and these were found to be hemostatic and again great care was  taken not to injure the bladder.  The uterus tubes and ovaries were all normal. Peritoneal cavity was irrigated vigorously. The muscles and peritoneum were reapproximated loosely. The fascia was closed using 0 Vicryl in running fashion. Subcutaneous tissue was made hemostatic and irrigated. The skin was closed using skin staples.   Blood loss for the procedure was 1000 cc. The patient received 2 gram of Ancef prophylactically. The patient was taken to the recovery room in good stable condition with all counts being correct x3.  EBL 1000 cc  EURE,LUTHER H 08/11/2015 8:11 AM

## 2015-08-12 ENCOUNTER — Encounter (HOSPITAL_COMMUNITY): Payer: Self-pay | Admitting: Obstetrics & Gynecology

## 2015-08-12 LAB — PREPARE FRESH FROZEN PLASMA
UNIT DIVISION: 0
Unit division: 0

## 2015-08-12 LAB — CBC
HEMATOCRIT: 19.3 % — AB (ref 36.0–46.0)
Hemoglobin: 6.6 g/dL — CL (ref 12.0–15.0)
MCH: 31.1 pg (ref 26.0–34.0)
MCHC: 34.2 g/dL (ref 30.0–36.0)
MCV: 91 fL (ref 78.0–100.0)
PLATELETS: 152 10*3/uL (ref 150–400)
RBC: 2.12 MIL/uL — ABNORMAL LOW (ref 3.87–5.11)
RDW: 13.1 % (ref 11.5–15.5)
WBC: 17.4 10*3/uL — AB (ref 4.0–10.5)

## 2015-08-12 LAB — RPR: RPR Ser Ql: NONREACTIVE

## 2015-08-12 MED ORDER — METRONIDAZOLE 0.75 % VA GEL: 1.0000 | Freq: Every day | VAGINAL | Status: DC

## 2015-08-12 MED ORDER — METRONIDAZOLE 0.75 % VA GEL
1.0000 | Freq: Every day | VAGINAL | Status: DC
  Administered 2015-08-12 – 2015-08-13 (×2): 1 via VAGINAL
  Filled 2015-08-12: qty 70

## 2015-08-12 NOTE — Lactation Note (Signed)
This note was copied from the chart of Stacy Moss. Lactation Consultation Note  Patient Name: Stacy Moss YNWGN'FToday's Date: 08/12/2015 Reason for consult: Follow-up assessment   With this mom of a NICU baby, now 9230 hours old, and 37 5/7 weeks CGA. I reviewed DEP teaching and hand expression with mom, and encouraged her to pump /HE every 2-3 hours, and to rest when needed. Her goal should be pumping at least 8 times a day, now for 15 minutes, up to 30 minutes when her milk transitions in.  [facility] which I will do for her early, today, for this Sunday's discharge to home. Mom has an appointment to get a DEP on Monday, 10/24.   Maternal Data    Feeding Feeding Type: Breast Fed Length of feed: 5 min  LATCH Score/Interventions Latch: Repeated attempts needed to sustain latch, nipple held in mouth throughout feeding, stimulation needed to elicit sucking reflex. Intervention(s): Adjust position;Assist with latch;Breast compression  Audible Swallowing: None Intervention(s):  (gave 1ml of pumped colustrum into mouth )  Type of Nipple: Everted at rest and after stimulation  Comfort (Breast/Nipple): Soft / non-tender     Hold (Positioning): Assistance needed to correctly position infant at breast and maintain latch.  LATCH Score: 6  Lactation Tools Discussed/Used WIC Program: Yes Pump Review: Setup, frequency, and cleaning;Other (comment) (premie setting, hand expression)   Consult Status Consult Status: Follow-up Date: 08/13/15 Follow-up type: In-patient    Alfred LevinsLee, Amri Lien Anne 08/12/2015, 3:12 PM

## 2015-08-12 NOTE — Progress Notes (Signed)
Results for Stacy Moss, Stacy Moss (MRN [id number]) as of 08/12/2015 13:54  Ref. Range 08/12/2015 06:38  WBC Latest Ref Range: 4.0-10.5 K/uL 17.4 (H)  RBC Latest Ref Range: 3.87-5.11 MIL/uL 2.12 (L)  Hemoglobin Latest Ref Range: 12.0-15.0 g/dL 6.6 (LL)  HCT Latest Ref Range: 36.0-46.0 % 19.3 (L)  MCV Latest Ref Range: 78.0-100.0 fL 91.0  MCH Latest Ref Range: 26.0-34.0 pg 31.1  MCHC Latest Ref Range: 30.0-36.0 g/dL 78.234.2  RDW Latest Ref Range: 11.5-15.5 % 13.1  Platelets Latest Ref Range: 150-400 K/uL 152   Above results called in to Philipp DeputyKim Shaw, CNM.  No new orders received.

## 2015-08-12 NOTE — Progress Notes (Signed)
Stacy Moss is a 24 y.o. [id number] who is POD 1 s/p emergent c-setion. Patient presented from clinic after she was found to be hypertensive with headache and scotomata at a routine prenatal visit. She was admitted for IOL at 1736w3d and was laboring well until the early morning on day 2 when she experienced sudden excruciated pain despite epidural and loss of station. She was emergently brought to the OR for c-section and found to have a large uterine rupture. Her infant required intubation on delivery and is presently in the NICU.  Subjective: Patient says she is doing "better" today. She reports her pain is adequately controlled with PO Percocet. She also reports that her pre-eclamptic symptoms at the time of admission have resolved. Pertinent negatives on ROS include no blurry vision, no headache, no dyspnea, no epigastric or RUQ pain, no nausea or vomiting, no fevers. Ambulating to bathroom without issues, reports normal voiding. Taking PO. Nausea controlled with scopolamine patch. No BM yet, however endorses "a lot" of flatus. Reports mild lochia with moderate bleeding, which is decreasing.  Objective: Filed Vitals:   08/11/15 1700 08/11/15 2103 08/12/15 0154 08/12/15 0503  BP: 127/80 158/66 105/58 129/57  Pulse: 75 73 81 85  Temp: 98.7 F (37.1 C) 98.7 F (37.1 C) 98.2 F (36.8 C) 98.2 F (36.8 C)  TempSrc: Oral Oral Oral Oral  Resp: 18 18 18 18   Height:      Weight:      SpO2: 100% 100% 100% 100%     GEN: alert, comfortable-appearing woman resting in hospital bed. PULM: CTAB on frontal field exam CV: RRR, S1 and S2 heard, no M/R/G appreciated ABD: Fundus firm below umbilicus. Abdomen appropriately TTP. No epigastric or RUQ pain. No guarding. INCISION: Dressing in place is C/D/I. No erythema, exudate, or bleeding noted. EXTR: No LE edema or calf tenderness.   Assessment / Plan: Stacy Moss is a 24 y.o. [id number] who is POD #1 s/p emergent c-section for uterine rupture during  TOLAC following IOL at 7173w2d for HTN and pre-eclamptic symptoms.  -- Pain controlled on current regimen -- Hemodynamically stable -- Pressures have been WNL for the most part, occasional hypertensive excursions -- Pre-eclampsia symptoms all resolved -- Tolerating PO -- Ambulating and voiding without issue -- Scopolamine for nausea -- Incision well-appearing -- no BM yet, +flatus -- Baby in the NICU, off the ventilator -- Planned contraception method: IUD -- SW consult ordered, will f/u with recs  Diet: Regular PPx: No DVT ppx at this time - patient is low risk and ambulating ad lib Code Status: FULL Dispo: Patient requires at least 2 additional midnights of inpatient-level care following uterine rupture and c-section.  Stacy Moss 08/10/2015, 7:25 AM
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
## 2015-08-13 MED ORDER — CYCLOBENZAPRINE HCL 5 MG PO TABS
5.0000 mg | ORAL_TABLET | Freq: Three times a day (TID) | ORAL | Status: DC | PRN
  Administered 2015-08-14: 5 mg via ORAL
  Filled 2015-08-13 (×2): qty 1

## 2015-08-13 MED ORDER — FERROUS SULFATE 325 (65 FE) MG PO TABS
325.0000 mg | ORAL_TABLET | Freq: Three times a day (TID) | ORAL | Status: DC
  Administered 2015-08-13 – 2015-08-14 (×5): 325 mg via ORAL
  Filled 2015-08-13 (×5): qty 1

## 2015-08-13 MED ORDER — KETOROLAC TROMETHAMINE 30 MG/ML IJ SOLN
30.0000 mg | Freq: Four times a day (QID) | INTRAMUSCULAR | Status: DC | PRN
  Administered 2015-08-13: 30 mg via INTRAVENOUS
  Filled 2015-08-13: qty 1

## 2015-08-13 NOTE — Progress Notes (Addendum)
Subjective: Postpartum Day 2: Cesarean Delivery Patient reports incisional pain, tolerating PO, + flatus and no problems voiding.   Called by RN for pain.   Objective: Vital signs in last 24 hours: Temp:  [97.8 F (36.6 C)-98.2 F (36.8 C)] 98.1 F (36.7 C) (10/22 1115) Pulse Rate:  [97-108] 108 (10/22 1115) Resp:  [16-18] 16 (10/22 1115) BP: (135-143)/(63-88) 143/78 mmHg (10/22 1115) SpO2:  [100 %] 100 % (10/22 1115)  Physical Exam:  General: alert, cooperative and appears stated age. She is moaning with pain but moves well in the bed. She is able move in the bed comfortably. She turns well.  Lochia: appropriate Uterine Fundus: firm Abdominal: Soft. TTP diffusely Incision: no significant drainage DVT Evaluation: No evidence of DVT seen on physical exam.   Recent Labs  08/11/15 0718 08/12/15 0638  HGB 8.4* 6.6*  HCT 25.2* 19.3*    Assessment/Plan: Status post Cesarean section. Doing well postoperatively.   #Pain management: Very reassuring abdominal exam. Will change to Toradol for pain. Continue percocet.   Isa RankinKimberly Niles [facility] 08/13/2015, 2:34 PM

## 2015-08-13 NOTE — Lactation Note (Signed)
This note was copied from the chart of Girl Raiford Simmondsesiree Gambill. Lactation Consultation Note  Patient Name: Girl Raiford SimmondsDesiree Pascoe WGNFA'OToday's Date: 08/13/2015 Reason for consult: Follow-up assessment;Infant < 6lbs;NICU baby;Late preterm infant   Follow up with mom at infant's bedside in NICU. Mom was attempting to latch infant. Infant sleepy and made little effort to latch. Mom did well with positioning, hand expressing and stimulating infant to feed. Mom reports she has been pumping and receiving small amounts. Enc mom to bring any and all EBM to NICU. NICU talked with mom and are planning to attempt to bottle feed infant EBM/Formula today feeding infant ad lib. Reviewed LPT infant behavior and encouraged mom to attempt to BF at every opportunity, STS with infant as both are able, and to continue to pump q 2-3 hours with one 4 hour stretch at night for sleep. [facility] and I encouraged her to pump in NICU while here to visit. Encouraged mom to call Lactation as needed.   Maternal Data Formula Feeding for Exclusion: No Does the patient have breastfeeding experience prior to this delivery?: Yes  Feeding Feeding Type: Breast Fed Length of feed: 0 min  LATCH Score/Interventions Latch: Too sleepy or reluctant, no latch achieved, no sucking elicited. Intervention(s): Skin to skin;Waking techniques Intervention(s): Adjust position;Assist with latch  Audible Swallowing: None Intervention(s): Skin to skin  Type of Nipple: Everted at rest and after stimulation  Comfort (Breast/Nipple): Soft / non-tender     Hold (Positioning): Assistance needed to correctly position infant at breast and maintain latch. Intervention(s): Breastfeeding basics reviewed;Support Pillows;Position options;Skin to skin  LATCH Score: 5  Lactation Tools Discussed/Used Tools: Pump WIC Program: Yes Pump Review: Milk Storage;Setup, frequency, and cleaning   Consult Status Consult Status: Follow-up Follow-up type:  Call as needed    Ed BlalockSharon S Pattrick Bady 08/13/2015, 12:32 PM

## 2015-08-13 NOTE — Progress Notes (Signed)
Subjective: Postpartum Day 2: Cesarean Delivery Patient reports incisional pain, tolerating PO, + flatus and no problems voiding.   Wants something stronger for pain  Objective: Vital signs in last 24 hours: Temp:  [97.8 F (36.6 C)-98.2 F (36.8 C)] 98.1 F (36.7 C) (10/22 1115) Pulse Rate:  [97-108] 108 (10/22 1115) Resp:  [16-18] 16 (10/22 1115) BP: (135-143)/(63-88) 143/78 mmHg (10/22 1115) SpO2:  [100 %] 100 % (10/22 1115)  Physical Exam:  General: alert, cooperative and appears stated age Lochia: appropriate Uterine Fundus: firm Incision: no significant drainage DVT Evaluation: No evidence of DVT seen on physical exam.   Recent Labs  08/11/15 0718 08/12/15 0638  HGB 8.4* 6.6*  HCT 25.2* 19.3*    Assessment/Plan: Status post Cesarean section. Doing well postoperatively.  Continue current care. Plan for discharge tomorrow Breastfeeding Plans IUD for contraception BP is up mildly  Stacy Moss 08/13/2015, 11:34 AM

## 2015-08-14 LAB — TYPE AND SCREEN
ABO/RH(D): AB POS
Antibody Screen: NEGATIVE
Unit division: 0
Unit division: 0
Unit division: 0
Unit division: 0
Unit division: 0
Unit division: 0
Unit division: 0
Unit division: 0
Unit division: 0
Unit division: 0

## 2015-08-14 LAB — CBC
HCT: 18.3 % — ABNORMAL LOW (ref 36.0–46.0)
HCT: 25.5 % — ABNORMAL LOW (ref 36.0–46.0)
Hemoglobin: 5.9 g/dL — CL (ref 12.0–15.0)
Hemoglobin: 8.5 g/dL — ABNORMAL LOW (ref 12.0–15.0)
MCH: 29.9 pg (ref 26.0–34.0)
MCH: 29.8 pg (ref 26.0–34.0)
MCHC: 32.2 g/dL (ref 30.0–36.0)
MCHC: 33.3 g/dL (ref 30.0–36.0)
MCV: 92.9 fL (ref 78.0–100.0)
MCV: 89.5 fL (ref 78.0–100.0)
PLATELETS: 183 10*3/uL (ref 150–400)
PLATELETS: 213 10*3/uL (ref 150–400)
RBC: 1.97 MIL/uL — ABNORMAL LOW (ref 3.87–5.11)
RBC: 2.85 MIL/uL — AB (ref 3.87–5.11)
RDW: 12.2 % (ref 11.5–15.5)
RDW: 13.6 % (ref 11.5–15.5)
WBC: 9.8 10*3/uL (ref 4.0–10.5)
WBC: 11.3 10*3/uL — AB (ref 4.0–10.5)

## 2015-08-14 LAB — PREPARE RBC (CROSSMATCH)

## 2015-08-14 MED ORDER — SODIUM CHLORIDE 0.9 % IV SOLN: Freq: Once | INTRAVENOUS | Status: DC

## 2015-08-14 MED ORDER — SODIUM CHLORIDE 0.9 % IV SOLN
Freq: Once | INTRAVENOUS | Status: AC
  Administered 2015-08-14: 10:00:00 via INTRAVENOUS

## 2015-08-14 MED ORDER — OXYCODONE-ACETAMINOPHEN 5-325 MG PO TABS
1.0000 | ORAL_TABLET | ORAL | Status: DC | PRN
Start: 2015-08-14 — End: 2015-08-28

## 2015-08-14 NOTE — Discharge Summary (Signed)
OB Discharge Summary     Patient Name: Stacy Moss DOB: 07/02/1991 MRN: [id number]  Date of admission: 08/10/2015 Delivering MD: Duane LopeEURE, LUTHER H   Date of discharge: 08/14/2015  Admitting diagnosis: 37WKS,HIGH BP Intrauterine pregnancy: 3243w4d     Secondary diagnosis:  Active Problems:   Gestational hypertension w/o significant proteinuria in 3rd trimester   Pre-eclampsia during pregnancy in third trimester, antepartum   S/P emergency cesarean section (uterine rupture)  Additional problems: none     Discharge diagnosis:as above   Gestational Hypertension                                          S/p uterine rupture                                                                                                 Post partum procedures:noneTransfusion 2 units prbc   Augmentation: Pitocin and Foley Balloon  Complications: uterine rupture  Hospital course:  Induction of Labor With Cesarean Section  24 y.o. yo [id number] at 6243w4d was admitted to the hospital 08/10/2015 for induction of labor. Patient had a labor course significant for uterine rupture. See op note for details. Infant in NICU. Patient had an uncomplicated postpartum course. She is ambulating, tolerating a regular diet, passing flatus, and urinating well.  Patient is discharged home in stable condition today. Her BP was monitored postpartum, BPs wnl or mild elevation, so not discharging with antihypertensive.  Will need f/u in 2-4 days for staple removal and BP check. We have asked our scheduler to assist patient in making these arrangements.                   Antental course also complicated by drug use (MJ, history cocaine use). Social work saw; CPS notified.    Physical exam  Filed Vitals:   08/13/15 1115 08/13/15 1712 08/13/15 2151 08/14/15 0535  BP: 143/78 128/70 144/94 145/81  Pulse: 108 110 113 100  Temp: 98.1 F (36.7 C) 98.2 F (36.8 C) 98.2 F (36.8 C) 99 F (37.2 C)  TempSrc: Oral Oral Oral Oral   Resp: 16 16 20 10   Height:      Weight:      SpO2: 100% 100% 100% 100%   General: alert, cooperative and no distress Lochia: appropriate Uterine Fundus: firm Incision: Healing well with no significant drainage DVT Evaluation: No cords or calf tenderness. Labs: Lab Results  Component Value Date   WBC 17.4* 08/12/2015   HGB 6.6* 08/12/2015   HCT 19.3* 08/12/2015   MCV 91.0 08/12/2015   PLT 152 08/12/2015   CMP Latest Ref Rng 08/10/2015  Glucose 65 - 99 mg/dL 72  BUN 6 - 20 mg/dL 10  Creatinine 8.410.44 - 3.241.00 mg/dL 4.010.44  Sodium 027135 - 253145 mmol/L 134(L)  Potassium 3.5 - 5.1 mmol/L 3.8  Chloride 101 - 111 mmol/L 105  CO2 22 - 32 mmol/L 24  Calcium 8.9 - 10.3 mg/dL 9.0  Total Protein 6.5 -  8.1 g/dL 7.2  Total Bilirubin 0.3 - 1.2 mg/dL 0.6  Alkaline Phos 38 - 126 U/L 90  AST 15 - 41 U/L 18  ALT 14 - 54 U/L 13(L)    Discharge instruction: per After Visit Summary and "Baby and Me Booklet".  Medications:  Current facility-administered medications:  .  acetaminophen (TYLENOL) tablet 650 mg, 650 mg, Oral, Q4H PRN, Lazaro Arms, MD .  cyclobenzaprine (FLEXERIL) tablet 5 mg, 5 mg, Oral, TID PRN, Federico Flake, MD, 5 mg at 08/14/15 0035 .  witch hazel-glycerin (TUCKS) pad 1 application, 1 application, Topical, PRN **AND** dibucaine (NUPERCAINAL) 1 % rectal ointment 1 application, 1 application, Rectal, PRN, Lazaro Arms, MD .  diphenhydrAMINE (BENADRYL) capsule 25 mg, 25 mg, Oral, Q6H PRN, Lazaro Arms, MD .  diphenhydrAMINE (BENADRYL) injection 12.5 mg, 12.5 mg, Intravenous, Q4H PRN **OR** diphenhydrAMINE (BENADRYL) capsule 25 mg, 25 mg, Oral, Q4H PRN, Leilani Able, MD .  ferrous sulfate tablet 325 mg, 325 mg, Oral, TID WC, Reva Bores, MD, 325 mg at 08/14/15 0757 .  ketorolac (TORADOL) 30 MG/ML injection 30 mg, 30 mg, Intravenous, Q6H PRN, Federico Flake, MD, 30 mg at 08/13/15 1715 .  lactated ringers infusion, , Intravenous, Continuous, Lazaro Arms, MD, Last  Rate: 125 mL/hr at 08/11/15 2237 .  lanolin ointment 1 application, 1 application, Topical, PRN, Lazaro Arms, MD .  menthol-cetylpyridinium (CEPACOL) lozenge 3 mg, 1 lozenge, Oral, Q2H PRN, Lazaro Arms, MD .  methylergonovine (METHERGINE) tablet 0.2 mg, 0.2 mg, Oral, Q4H PRN **OR** methylergonovine (METHERGINE) injection 0.2 mg, 0.2 mg, Intramuscular, Q4H PRN, Lazaro Arms, MD .  metroNIDAZOLE (METROGEL) 0.75 % vaginal gel 1 Applicatorful, 1 Applicatorful, Vaginal, QHS, Gerri Spore, MD, 1 Applicatorful at 08/13/15 2151 .  nalbuphine (NUBAIN) injection 5 mg, 5 mg, Intravenous, Q4H PRN **OR** nalbuphine (NUBAIN) injection 5 mg, 5 mg, Subcutaneous, Q4H PRN, Leilani Able, MD .  nalbuphine (NUBAIN) injection 5 mg, 5 mg, Intravenous, Once PRN **OR** nalbuphine (NUBAIN) injection 5 mg, 5 mg, Subcutaneous, Once PRN, Leilani Able, MD .  naloxone (NARCAN) 2 mg in dextrose 5 % 250 mL infusion, 1-4 mcg/kg/hr, Intravenous, Continuous PRN, Leilani Able, MD .  naloxone [facility]) injection 0.4 mg, 0.4 mg, Intravenous, PRN **AND** sodium chloride 0.9 % injection 3 mL, 3 mL, Intravenous, PRN, Leilani Able, MD .  ondansetron (ZOFRAN) injection 4 mg, 4 mg, Intravenous, Q8H PRN, Leilani Able, MD .  oxyCODONE-acetaminophen (PERCOCET/ROXICET) 5-325 MG per tablet 1 tablet, 1 tablet, Oral, Q4H PRN, Lazaro Arms, MD, 1 tablet at 08/14/15 0035 .  oxyCODONE-acetaminophen (PERCOCET/ROXICET) 5-325 MG per tablet 2 tablet, 2 tablet, Oral, Q4H PRN, Lazaro Arms, MD, 2 tablet at 08/14/15 0538 .  prenatal multivitamin tablet 1 tablet, 1 tablet, Oral, Q1200, Lazaro Arms, MD, 1 tablet at 08/13/15 1116 .  scopolamine (TRANSDERM-SCOP) 1 MG/3DAYS 1.5 mg, 1 patch, Transdermal, Once, Leilani Able, MD .  senna-docusate (Senokot-S) tablet 2 tablet, 2 tablet, Oral, Q24H, Lazaro Arms, MD, 2 tablet at 08/14/15 0035 .  simethicone (MYLICON) chewable tablet 80 mg, 80 mg, Oral, TID PC, Lazaro Arms, MD,  80 mg at 08/14/15 0757 .  simethicone (MYLICON) chewable tablet 80 mg, 80 mg, Oral, Q24H, Lazaro Arms, MD, 80 mg at 08/14/15 0035 .  simethicone (MYLICON) chewable tablet 80 mg, 80 mg, Oral, PRN, Lazaro Arms, MD, 80 mg at 08/12/15 2248 .  Tdap (BOOSTRIX) injection 0.5 mL, 0.5 mL, Intramuscular, Once, Amaryllis Dyke  Eure, MD, 0.5 mL at 08/12/15 1158 .  zolpidem (AMBIEN) tablet 5 mg, 5 mg, Oral, QHS PRN, Lazaro Arms, MD After visit meds:    Medication List    STOP taking these medications        metroNIDAZOLE 0.75 % vaginal gel  Commonly known as:  METROGEL VAGINAL      TAKE these medications        acetaminophen 500 MG tablet  Commonly known as:  TYLENOL  Take 500 mg by mouth every 6 (six) hours as needed for headache.        Diet: routine diet  Activity: Advance as tolerated. Pelvic rest for 6 weeks.   Outpatient follow up: 2-4 days staple removal and BP check Follow up Appt:Future Appointments Date Time Provider Department Center  09/21/2015 2:15 PM Allie Bossier, MD [facility]   Follow up Visit: see above  Postpartum contraception: Nexplanon  Newborn Data: Live born female  Birth Weight: 5 lb 14.2 oz (2670 g) APGAR: 2, 4  Baby Feeding: Bottle and Breast Disposition:NICU   08/14/2015 Silvano Bilis, MD  Addendum: Transfusion 2 units prbc. Pt describes herself as fatigued with the activity of visiting the baby, and she will not have much support with her other children when she goes home. As a result, pt will request that transfusion be done, and d/c will be delayed til this pm.

## 2015-08-14 NOTE — Progress Notes (Signed)
CRITICAL VALUE ALERT  Critical value received:  Hgb 5.9  Date of notification:  08/14/15  Time of notification:  1000  Critical value read back: yes  Nurse who received alert:  Glory RosebushBriana Del Wiseman, RN   MD notified (1st page):  Dr. Emelda FearFerguson  Time of first page:  1000  MD notified (2nd page):  Time of second page:  Responding MD:  Emelda FearFerguson  Time MD responded:  1000

## 2015-08-14 NOTE — Lactation Note (Signed)
This note was copied from the chart of Girl Raiford Simmondsesiree Mcfarren. Lactation Consultation Note  Patient Name: Girl Raiford SimmondsDesiree Gammon ZOXWR'UToday's Date: 08/14/2015 Reason for consult: Follow-up assessment;Infant < 6lbs;Late preterm infant;NICU baby   Follow-up consult at 79 hrs for 37.4 week, 5 lbs, 14.2 oz NICU baby.  Mom is a P2 and is being discharged today. Mom reports she is pumping regularly and is getting 10 ml with pumping.   [facility].  Encouraged mom to take top round pieces from pump with her when she leaves. Encouraged mom to pump at least 8 times a day and to keep a pumping log. Mom has been latching some in NICU.  Encouraged mom to get RN to help her with latching and [facility] needed while in [facility]. Discussed outpatient support if needed after baby is discharged.     Maternal Data Formula Feeding for Exclusion: Yes Reason for exclusion: Admission to Intensive Care Unit (ICU) post-partum Has patient been taught Hand Expression?: Yes  Feeding Feeding Type: Breast Milk Nipple Type: Slow - flow Length of feed: 25 min  LATCH Score/Interventions                      Lactation Tools Discussed/Used     Consult Status Consult Status: Complete    Lendon KaVann, Evangaline Jou Walker 08/14/2015, 5:09 PM

## 2015-08-14 NOTE — Discharge Instructions (Signed)
Hypertension During Pregnancy Hypertension is also called high blood pressure. Blood pressure moves blood in your body. Sometimes, the force that moves the blood becomes too strong. When you are pregnant, this condition should be watched carefully. It can cause problems for you and your baby. HOME CARE   Make and keep all of your doctor visits.  Take medicine as told by your doctor. Tell your doctor about all medicines you take.  Eat very little salt.  Exercise regularly.  Do not drink alcohol.  Do not smoke.  Do not have drinks with caffeine.  Lie on your left side when resting.  Your health care provider may ask you to take one low-dose aspirin ( ) each day. GET HELP RIGHT AWAY IF:  You have bad belly (abdominal) pain.  You have sudden puffiness (swelling) in the hands, ankles, or face.  You gain 4 pounds (1.8 kilograms) or more in 1 week.  You throw up (vomit) repeatedly.  You have bleeding from the vagina.  You do not feel the baby moving as much.  You have a headache.  You have blurred or double vision.  You have muscle twitching or spasms.  You have shortness of breath.  You have blue fingernails and lips.  You have blood in your pee (urine). MAKE SURE YOU:  Understand these instructions.  Will watch your condition.  Will get help right away if you are not doing well or get worse.   This information is not intended to replace advice given to you by your health care provider. Make sure you discuss any questions you have with your health care provider.   Document Released: 11/10/2010 Document Revised: 10/29/2014 Document Reviewed: 05/07/2013 Elsevier Interactive Patient Education 2016 Elsevier Inc.   Cesarean Delivery, Care After Refer to this sheet in the next few weeks. These instructions provide you with information on caring for yourself after your procedure. Your health care provider may also give you specific instructions. Your treatment has  been planned according to current medical practices, but problems sometimes occur. Call your health care provider if you have any problems or questions after you go home. HOME CARE INSTRUCTIONS  Only take over-the-counter or prescription medications as directed by your health care provider.  Do not drink alcohol, especially if you are breastfeeding or taking medication to relieve pain.  Do not chew or smoke tobacco.  Continue to use good perineal care. Good perineal care includes:  Wiping your perineum from front to back.  Keeping your perineum clean.  Check your surgical cut (incision) daily for increased redness, drainage, swelling, or separation of skin.  Clean your incision gently with soap and water every day, and then pat it dry. If your health care provider says it is okay, leave the incision uncovered. Use a bandage (dressing) if the incision is draining fluid or appears irritated. If the adhesive strips across the incision do not fall off within 7 days, carefully peel them off.  Hug a pillow when coughing or sneezing until your incision is healed. This helps to relieve pain.  Do not use tampons or douche until your health care provider says it is okay.  Shower, wash your hair, and take tub baths as directed by your health care provider.  Wear a well-fitting bra that provides breast support.  Limit wearing support panties or control-top hose.  Drink enough fluids to keep your urine clear or pale yellow.  Eat high-fiber foods such as whole grain cereals and breads, brown rice, beans, and  fresh fruits and vegetables every day. These foods may help prevent or relieve constipation.  Resume activities such as climbing stairs, driving, lifting, exercising, or traveling as directed by your health care provider.  Talk to your health care provider about resuming sexual activities. This is dependent upon your risk of infection, your rate of healing, and your comfort and desire to  resume sexual activity.  Try to have someone help you with your household activities and your newborn for at least a few days after you leave the hospital.  Rest as much as possible. Try to rest or take a nap when your newborn is sleeping.  Increase your activities gradually.  Keep all of your scheduled postpartum appointments. It is very important to keep your scheduled follow-up appointments. At these appointments, your health care provider will be checking to make sure that you are healing physically and emotionally. SEEK MEDICAL CARE IF:   You are passing large clots from your vagina. Save any clots to show your health care provider.  You have a foul smelling discharge from your vagina.  You have trouble urinating.  You are urinating frequently.  You have pain when you urinate.  You have a change in your bowel movements.  You have increasing redness, pain, or swelling near your incision.  You have pus draining from your incision.  Your incision is separating.  You have painful, hard, or reddened breasts.  You have a severe headache.  You have blurred vision or see spots.  You feel sad or depressed.  You have thoughts of hurting yourself or your newborn.  You have questions about your care, the care of your newborn, or medications.  You are dizzy or light-headed.  You have a rash.  You have pain, redness, or swelling at the site of the removed intravenous access (IV) tube.  You have nausea or vomiting.  You stopped breastfeeding and have not had a menstrual period within 12 weeks of stopping.  You are not breastfeeding and have not had a menstrual period within 12 weeks of delivery.  You have a fever. SEEK IMMEDIATE MEDICAL CARE IF:  You have persistent pain.  You have chest pain.  You have shortness of breath.  You faint.  You have leg pain.  You have stomach pain.  Your vaginal bleeding saturates 2 or more sanitary pads in 1 hour. MAKE SURE  YOU:   Understand these instructions.  Will watch your condition.  Will get help right away if you are not doing well or get worse.   This information is not intended to replace advice given to you by your health care provider. Make sure you discuss any questions you have with your health care provider.   Document Released: 06/30/2002 Document Revised: 10/29/2014 Document Reviewed: 06/04/2012 Elsevier Interactive Patient Education [employer] Elsevier Inc.

## 2015-08-16 ENCOUNTER — Observation Stay (HOSPITAL_COMMUNITY)
Admission: AD | Admit: 2015-08-16 | Discharge: 2015-08-17 | Disposition: A | Discharge status: Home / Self Care | Payer: Medicaid Other | Source: Ambulatory Visit | Attending: Family Medicine | Admitting: Family Medicine

## 2015-08-16 ENCOUNTER — Encounter (HOSPITAL_COMMUNITY): Payer: Self-pay

## 2015-08-16 DIAGNOSIS — O99345 Other mental disorders complicating the puerperium: Secondary | ICD-10-CM | POA: Insufficient documentation

## 2015-08-16 DIAGNOSIS — O09899 Supervision of other high risk pregnancies, unspecified trimester: Secondary | ICD-10-CM

## 2015-08-16 DIAGNOSIS — O135 Gestational [pregnancy-induced] hypertension without significant proteinuria, complicating the puerperium: Secondary | ICD-10-CM | POA: Diagnosis not present

## 2015-08-16 DIAGNOSIS — O99335 Smoking (tobacco) complicating the puerperium: Secondary | ICD-10-CM | POA: Insufficient documentation

## 2015-08-16 DIAGNOSIS — Z98891 History of uterine scar from previous surgery: Secondary | ICD-10-CM

## 2015-08-16 DIAGNOSIS — D649 Anemia, unspecified: Secondary | ICD-10-CM | POA: Diagnosis not present

## 2015-08-16 DIAGNOSIS — F319 Bipolar disorder, unspecified: Secondary | ICD-10-CM | POA: Diagnosis not present

## 2015-08-16 DIAGNOSIS — F17210 Nicotine dependence, cigarettes, uncomplicated: Secondary | ICD-10-CM | POA: Insufficient documentation

## 2015-08-16 DIAGNOSIS — Z8759 Personal history of other complications of pregnancy, childbirth and the puerperium: Secondary | ICD-10-CM

## 2015-08-16 DIAGNOSIS — Z8659 Personal history of other mental and behavioral disorders: Secondary | ICD-10-CM

## 2015-08-16 DIAGNOSIS — F419 Anxiety disorder, unspecified: Secondary | ICD-10-CM | POA: Diagnosis not present

## 2015-08-16 DIAGNOSIS — O99322 Drug use complicating pregnancy, second trimester: Secondary | ICD-10-CM | POA: Diagnosis present

## 2015-08-16 LAB — COMPREHENSIVE METABOLIC PANEL
ALT: 18 U/L (ref 14–54)
AST: 23 U/L (ref 15–41)
Albumin: 2.4 g/dL — ABNORMAL LOW (ref 3.5–5.0)
Alkaline Phosphatase: 47 U/L (ref 38–126)
Anion gap: 3 — ABNORMAL LOW (ref 5–15)
BILIRUBIN TOTAL: 0.4 mg/dL (ref 0.3–1.2)
BUN: 16 mg/dL (ref 6–20)
CHLORIDE: 110 mmol/L (ref 101–111)
CO2: 25 mmol/L (ref 22–32)
CREATININE: 0.58 mg/dL (ref 0.44–1.00)
Calcium: 8 mg/dL — ABNORMAL LOW (ref 8.9–10.3)
Glucose, Bld: 95 mg/dL (ref 65–99)
POTASSIUM: 3.5 mmol/L (ref 3.5–5.1)
Sodium: 138 mmol/L (ref 135–145)
TOTAL PROTEIN: 5.3 g/dL — AB (ref 6.5–8.1)

## 2015-08-16 LAB — CBC
HEMATOCRIT: 19.8 % — AB (ref 36.0–46.0)
HEMATOCRIT: 22.2 % — AB (ref 36.0–46.0)
HEMOGLOBIN: 7.8 g/dL — AB (ref 12.0–15.0)
Hemoglobin: 6.9 g/dL — CL (ref 12.0–15.0)
MCH: 30.6 pg (ref 26.0–34.0)
MCH: 30.8 pg (ref 26.0–34.0)
MCHC: 34.8 g/dL (ref 30.0–36.0)
MCHC: 35.1 g/dL (ref 30.0–36.0)
MCV: 87.1 fL (ref 78.0–100.0)
MCV: 88.4 fL (ref 78.0–100.0)
PLATELETS: 237 10*3/uL (ref 150–400)
Platelets: 190 10*3/uL (ref 150–400)
RBC: 2.24 MIL/uL — ABNORMAL LOW (ref 3.87–5.11)
RBC: 2.55 MIL/uL — ABNORMAL LOW (ref 3.87–5.11)
RDW: 13.5 % (ref 11.5–15.5)
RDW: 14.4 % (ref 11.5–15.5)
WBC: 8.5 10*3/uL (ref 4.0–10.5)
WBC: 14.3 10*3/uL — AB (ref 4.0–10.5)

## 2015-08-16 LAB — PREPARE RBC (CROSSMATCH)

## 2015-08-16 MED ORDER — DIPHENHYDRAMINE HCL 25 MG PO CAPS
25.0000 mg | ORAL_CAPSULE | Freq: Once | ORAL | Status: AC
  Administered 2015-08-16: 25 mg via ORAL
  Filled 2015-08-16: qty 1

## 2015-08-16 MED ORDER — CALCIUM CARBONATE ANTACID 500 MG PO CHEW
2.0000 | CHEWABLE_TABLET | ORAL | Status: DC | PRN
  Filled 2015-08-16: qty 2

## 2015-08-16 MED ORDER — METHYLERGONOVINE MALEATE 0.2 MG PO TABS
0.2000 mg | ORAL_TABLET | Freq: Three times a day (TID) | ORAL | Status: DC
  Administered 2015-08-16 – 2015-08-17 (×6): 0.2 mg via ORAL
  Filled 2015-08-16 (×6): qty 1

## 2015-08-16 MED ORDER — SODIUM CHLORIDE 0.9 % IV SOLN
Freq: Once | INTRAVENOUS | Status: AC
  Administered 2015-08-16: 03:00:00 via INTRAVENOUS

## 2015-08-16 MED ORDER — IBUPROFEN 600 MG PO TABS
600.0000 mg | ORAL_TABLET | Freq: Four times a day (QID) | ORAL | Status: DC
  Administered 2015-08-16 – 2015-08-17 (×6): 600 mg via ORAL
  Filled 2015-08-16 (×6): qty 1

## 2015-08-16 MED ORDER — IBUPROFEN 800 MG PO TABS: 800.0000 mg | ORAL_TABLET | Freq: Three times a day (TID) | ORAL | Status: DC | PRN

## 2015-08-16 MED ORDER — DOCUSATE SODIUM 100 MG PO CAPS
100.0000 mg | ORAL_CAPSULE | Freq: Every day | ORAL | Status: DC
  Administered 2015-08-16: 100 mg via ORAL
  Filled 2015-08-16 (×2): qty 1

## 2015-08-16 MED ORDER — OXYCODONE-ACETAMINOPHEN 5-325 MG PO TABS
1.0000 | ORAL_TABLET | ORAL | Status: DC | PRN
  Administered 2015-08-16 (×2): 2 via ORAL
  Administered 2015-08-17 (×3): 1 via ORAL
  Filled 2015-08-16 (×2): qty 1
  Filled 2015-08-16 (×2): qty 2
  Filled 2015-08-16 (×2): qty 1

## 2015-08-16 MED ORDER — PRENATAL MULTIVITAMIN CH
1.0000 | ORAL_TABLET | Freq: Every day | ORAL | Status: DC
  Administered 2015-08-16 – 2015-08-17 (×2): 1 via ORAL
  Filled 2015-08-16 (×3): qty 1

## 2015-08-16 NOTE — H&P (Signed)
Chief Complaint  Patient presents with  . Vaginal Bleeding   HPI Comments: [name] is a 24 y.o. [id number] who is S/P c-section after uterine rupture on 08/11/15. She was DC home on 08/14/15. Review of chart shows pregnancy complicated by preeclampsia with severe features.  Was attempting TOLAC, when had uterine rupture leading to emergent cesarean.  Discharge was at POD#3.  Yesterday, she states that today she spent time running errands, and going to [employer]. After that she was at home standing at the sink washing dishes and she started to have a large gush of BRB from vagina. Also complains of having dizziness, lightheadedness, worse with standing and improved with rest.  No difficulty breathing, fevers, chills, nausea, vomiting.  Vaginal Bleeding The patient's primary symptoms include vaginal bleeding. This is a new problem. The current episode started today (around 0000). The problem occurs constantly. The problem has been unchanged. The patient is experiencing no pain. The vaginal discharge was bloody. The vaginal bleeding is heavier than menses. She has been passing clots. She has not been passing tissue. The symptoms are aggravated by activity. She has tried nothing for the symptoms. The treatment provided no relief.     Past Medical History  Diagnosis Date  . Hx of tonsillectomy   . S/P cesarean section 05/25/2012  . Seizures [facility](HCC)     age [age]- after fell and hit head  . Hypertension     on meds 2011  . Anxiety   . Mental disorder   . Depression   . Bipolar affective (HCC)   . ADD (attention deficit disorder)   . Polysubstance abuse 10/2014    Ectasy use  . History of marijuana use   . History of cocaine use 10/2014  . History of suicidal ideation   . Smoker   . History of VBAC   . History of gestational diabetes mellitus (GDM)     with prior pregnancy  . Hx of eye surgery 02/2015    Past  Surgical History  Procedure Laterality Date  . Tonsillectomy    . Cesarean section  05/25/2012    Procedure: CESAREAN SECTION; Surgeon: Sherron MondayJody Bovard, MD; Location: [facility]S; Service: Gynecology; Laterality: N/A; Primary cesarean section with delivery of baby girl at 0700. Apgars9/9.  Marland Kitchen. Orif orbital fracture Left 02/25/2015    Procedure: OPEN REDUCTION INTERNAL FIXATION (ORIF) LEFT ORBITAL FRACTURE; Surgeon: Melvenia BeamMitchell Gore, MD; [facility]; Service: ENT; Laterality: Left;  . Cesarean section N/A 08/11/2015    Procedure: CESAREAN SECTION; Surgeon: Lazaro ArmsLuther H Eure, MD; Location: [facility]S; Service: Obstetrics; Laterality: N/A;    Family History  Problem Relation Age of Onset  . Anesthesia problems Neg Hx   . Cancer Maternal Grandmother   . Hypertension Maternal Grandmother   . Hypertension Mother     Social History  Substance Use Topics  . Smoking status: Current Every Day Smoker -- 0.25 packs/day for 10 years    Types: Cigarettes  . Smokeless tobacco: Never Used  . Alcohol Use: No     Comment: Former EtOH abuse    Allergies: No Known Allergies  Prescriptions prior to admission  Medication Sig Dispense Refill Last Dose  . acetaminophen (TYLENOL) 500 MG tablet Take 500 mg by mouth every 6 (six) hours as needed for headache.   08/09/2015 at Unknown time  . oxyCODONE-acetaminophen (PERCOCET/ROXICET) 5-325 MG tablet Take 1 tablet by mouth every 4 (four) hours as needed (for pain scale 4-7). 30 tablet 0     Review  of Systems  Genitourinary: Positive for vaginal bleeding.  All other 10 systems negative.  Physical Exam   Blood pressure 141/74, pulse 93, temperature 98 F (36.7 C), temperature source Oral, resp. rate 16, last menstrual period 10/22/2014, SpO2 100 %, unknown if currently breastfeeding.  Physical Exam  Nursing note and vitals reviewed. Constitutional: She is oriented to  person, place, and time. She appears well-developed and well-nourished. No distress.  HENT:  Head: Normocephalic. External ears normal bilaterally.  Conjunctiva pale.  EOMI, no icterus. Neck: supple, no LAD, thyroid non enlarged. Cardiovascular: Normal rate. No murmur Respiratory: Effort normal. CTA bilaterally GI: Soft. There is no tenderness. Incision clean, dry, intact.  Honeycomb dressing and staples present. Genitourinary:  Fundus firm, scant amount of vaginal bleeding at this time.  Neurological: She is alert and oriented to person, place, and time.  Skin: Skin is warm and dry.  Psychiatric: She has a normal mood and affect.   When patient first arrived there was a large amount of BRB soaking the pad/sheets used by EMS. Cleaned up and new pad applied by RN.    Lab Results Last 24 Hours    Results for orders placed or performed during the hospital encounter of 08/16/15 (from the past 24 hour(s))  CBC Status: Abnormal   Collection Time: 08/16/15 12:45 AM  Result Value Ref Range   WBC 8.5 4.0 - 10.5 K/uL   RBC 2.24 (L) 3.87 - 5.11 MIL/uL   Hemoglobin 6.9 (LL) 12.0 - 15.0 g/dL   HCT 16.1 (L) 09.6 - 04.5 %   MCV 88.4 78.0 - 100.0 fL   MCH 30.8 26.0 - 34.0 pg   MCHC 34.8 30.0 - 36.0 g/dL   RDW 40.9 81.1 - 91.4 %   Platelets 237 150 - 400 K/uL      MAU Course  Procedures    Assessment and Plan  1.  Symptomatic anemia 2.  Delayed PPH OBS on 3rd floor 2 units PRBC Recheck CBC after transfusion. Ibuprofen  q6 hours  Percocet prn Methergine 0.2mg  TID If continues to have bleeding, may need to have [place].  Levie Heritage, DO 08/16/2015 7:12 AM

## 2015-08-16 NOTE — Progress Notes (Signed)
Chaplain provided spiritual care visitation to the patient to offer emotional support and prayer of peace and well being on her health and that of her new born and other family support.  She was appreciative of the visit. Chaplain Janell QuietAudrey Thornton

## 2015-08-16 NOTE — Progress Notes (Signed)
Staples removed per order... Patient tolerated well. Incision clean and dry. New honeycomb placed, and patient instructed to remove on Thursday... Verbalized understanding.

## 2015-08-16 NOTE — Progress Notes (Signed)
Thressa ShellerHeather Hogan CNM spoke to pt about possibility of being discharged to home tonight.  Pt agreed but first per [facility], have pt sit on side of bed and then ambulate in room.  Assisted pt up slowly, she sit on side of bed for a few minutes, stood up, but became dizzy and had to lay back in bed.  Notified Heather CNM and pt will be admit to 3rd floor.  Vitals taken: BP 128/73, P 100.  PO fluids given.

## 2015-08-16 NOTE — MAU Provider Note (Signed)
History     CSN: [id number]  Arrival date and time: 08/16/15 0027   First Provider Initiated Contact with Patient 08/16/15 0130      Chief Complaint  Patient presents with  . Vaginal Bleeding   HPI Comments: Stacy Moss is a 24 y.o. [id number] who is S/P c-section after uterine rupture on 08/11/15. She was DC home on 08/14/15. She states that today she spent time running errands, and going to [employer]. After that she was at home standing at the sink washing dishes and she started to have a large gush of BRB.   Vaginal Bleeding The patient's primary symptoms include vaginal bleeding. This is a new problem. The current episode started today (around 0000). The problem occurs constantly. The problem has been unchanged. The patient is experiencing no pain. The vaginal discharge was bloody. The vaginal bleeding is heavier than menses. She has been passing clots. She has not been passing tissue. The symptoms are aggravated by activity. She has tried nothing for the symptoms. The treatment provided no relief.     Past Medical History  Diagnosis Date  . Hx of tonsillectomy   . S/P cesarean section 05/25/2012  . Seizures [facility](HCC)     age [age]- after fell and hit head  . Hypertension     on meds 2011  . Anxiety   . Mental disorder   . Depression   . Bipolar affective (HCC)   . ADD (attention deficit disorder)   . Polysubstance abuse 10/2014    Ectasy use  . History of marijuana use   . History of cocaine use 10/2014  . History of suicidal ideation   . Smoker   . History of VBAC   . History of gestational diabetes mellitus (GDM)     with prior pregnancy  . Hx of eye surgery 02/2015    Past Surgical History  Procedure Laterality Date  . Tonsillectomy    . Cesarean section  05/25/2012    Procedure: CESAREAN SECTION;  Surgeon: Sherron MondayJody Bovard, MD;  Location: [facility] ORS;  Service: Gynecology;  Laterality: N/A;  Primary cesarean section with delivery of baby girl at 0700. Apgars9/9.  Marland Kitchen. Orif  orbital fracture Left 02/25/2015    Procedure: OPEN REDUCTION INTERNAL FIXATION (ORIF) LEFT ORBITAL FRACTURE;  Surgeon: Melvenia BeamMitchell Gore, MD;  [facility];  Service: ENT;  Laterality: Left;  . Cesarean section N/A 08/11/2015    Procedure: CESAREAN SECTION;  Surgeon: Lazaro ArmsLuther H Eure, MD;  Location: [facility];  Service: Obstetrics;  Laterality: N/A;    Family History  Problem Relation Age of Onset  . Anesthesia problems Neg Hx   . Cancer Maternal Grandmother   . Hypertension Maternal Grandmother   . Hypertension Mother     Social History  Substance Use Topics  . Smoking status: Current Every Day Smoker -- 0.25 packs/day for 10 years    Types: Cigarettes  . Smokeless tobacco: Never Used  . Alcohol Use: No     Comment: Former EtOH abuse    Allergies: No Known Allergies  Prescriptions prior to admission  Medication Sig Dispense Refill Last Dose  . acetaminophen (TYLENOL) 500 MG tablet Take 500 mg by mouth every 6 (six) hours as needed for headache.   08/09/2015 at Unknown time  . oxyCODONE-acetaminophen (PERCOCET/ROXICET) 5-325 MG tablet Take 1 tablet by mouth every 4 (four) hours as needed (for pain scale 4-7). 30 tablet 0     Review of Systems  Genitourinary: Positive for vaginal bleeding.  Physical Exam   Blood pressure 141/74, pulse 93, temperature 98 F (36.7 C), temperature source Oral, resp. rate 16, last menstrual period 10/22/2014, SpO2 100 %, unknown if currently breastfeeding.  Physical Exam  Nursing note and vitals reviewed. Constitutional: She is oriented to person, place, and time. She appears well-developed and well-nourished. No distress.  HENT:  Head: Normocephalic.  Cardiovascular: Normal rate.   Respiratory: Effort normal.  GI: Soft. There is no tenderness.  Genitourinary:  Fundus firm, scant amount of vaginal bleeding at this time.   Neurological: She is alert and oriented to person, place, and time.  Skin: Skin is warm and dry.  Psychiatric: She has a  normal mood and affect.   When patient first arrived there was a large amount of BRB soaking the pad/sheets used by EMS. Cleaned up and new pad applied by RN.   Results for orders placed or performed during the hospital encounter of 08/16/15 (from the past 24 hour(s))  CBC     Status: Abnormal   Collection Time: 08/16/15 12:45 AM  Result Value Ref Range   WBC 8.5 4.0 - 10.5 K/uL   RBC 2.24 (L) 3.87 - 5.11 MIL/uL   Hemoglobin 6.9 (LL) 12.0 - 15.0 g/dL   HCT 16.1 (L) 09.6 - 04.5 %   MCV 88.4 78.0 - 100.0 fL   MCH 30.8 26.0 - 34.0 pg   MCHC 34.8 30.0 - 36.0 g/dL   RDW 40.9 81.1 - 91.4 %   Platelets 237 150 - 400 K/uL    MAU Course  Procedures  MDM 0154: D/W Dr. Adrian Blackwater. If patient able to ambulate without getting dizzy then may be DC home with methergine after dose of feraheme. If unable to ambulate then will admit to 3rd floor for 2U PRBCs and methergine. 0208: Patient was unable to ambulate. Once standing by the side of the bed she became very dizzy.   Assessment and Plan  Symptomatic anemia Delayed PPH OBS on 3rd floor 2 units PRBC   Tawnya Crook 08/16/2015, 1:42 AM

## 2015-08-16 NOTE — Lactation Note (Signed)
This note was copied from the chart of Girl Raiford Simmondsesiree Diviney. Lactation Consultation Note  Patient Name: Girl Raiford SimmondsDesiree Krauss ZOXWR'UToday's Date: 08/16/2015 Reason for consult: Follow-up assessment   With this mom of a NICU baby, now 525 days old, and 37 4/7 weeks CGA. Mom was readmtited to the hospital for vaginal bleeding. She reports she has pumped 4 ounces of milk this morning. She also said the baby may be discharged home with her tomorrow. i told mom to call for me if she wanted help with latching her baby in the NICU today. I will follow up with her and baby later today.   Maternal Data    Feeding Feeding Type: Breast Fed Nipple Type: Slow - flow Length of feed: 20 min  LATCH Score/Interventions Latch: Repeated attempts needed to sustain latch, nipple held in mouth throughout feeding, stimulation needed to elicit sucking reflex. Intervention(s): Skin to skin Intervention(s): Adjust position;Assist with latch  Audible Swallowing: A few with stimulation Intervention(s): Skin to skin Intervention(s): Skin to skin;Hand expression  Type of Nipple: Everted at rest and after stimulation  Comfort (Breast/Nipple): Soft / non-tender     Hold (Positioning): Assistance needed to correctly position infant at breast and maintain latch.  LATCH Score: 7  Lactation Tools Discussed/Used [facility]IC Program[facility] is using loaner DEP at this time)   Consult Status Consult Status: PRN Follow-up type: In-patient (NICU)    Alfred LevinsLee, Masiyah Engen Anne 08/16/2015, 2:50 PM

## 2015-08-16 NOTE — MAU Note (Signed)
Vaginal bleeding since midnight

## 2015-08-17 ENCOUNTER — Encounter (HOSPITAL_COMMUNITY): Payer: Self-pay | Admitting: *Deleted

## 2015-08-17 ENCOUNTER — Encounter: Payer: Medicaid Other | Admitting: Certified Nurse Midwife

## 2015-08-17 DIAGNOSIS — O9903 Anemia complicating the puerperium: Secondary | ICD-10-CM | POA: Diagnosis not present

## 2015-08-17 LAB — TYPE AND SCREEN
ABO/RH(D): AB POS
ANTIBODY SCREEN: NEGATIVE
Unit division: 0
Unit division: 0
Unit division: 0
Unit division: 0

## 2015-08-17 LAB — CBC
HEMATOCRIT: 19.2 % — AB (ref 36.0–46.0)
HEMATOCRIT: 25.2 % — AB (ref 36.0–46.0)
HEMOGLOBIN: 8.5 g/dL — AB (ref 12.0–15.0)
Hemoglobin: 6.6 g/dL — CL (ref 12.0–15.0)
MCH: 30.3 pg (ref 26.0–34.0)
MCH: 29.4 pg (ref 26.0–34.0)
MCHC: 34.4 g/dL (ref 30.0–36.0)
MCHC: 33.7 g/dL (ref 30.0–36.0)
MCV: 88.1 fL (ref 78.0–100.0)
MCV: 87.2 fL (ref 78.0–100.0)
Platelets: 188 10*3/uL (ref 150–400)
Platelets: 220 10*3/uL (ref 150–400)
RBC: 2.18 MIL/uL — AB (ref 3.87–5.11)
RBC: 2.89 MIL/uL — AB (ref 3.87–5.11)
RDW: 14.9 % (ref 11.5–15.5)
RDW: 14.4 % (ref 11.5–15.5)
WBC: 12.3 10*3/uL — AB (ref 4.0–10.5)
WBC: 16.7 10*3/uL — ABNORMAL HIGH (ref 4.0–10.5)

## 2015-08-17 LAB — PREPARE RBC (CROSSMATCH)

## 2015-08-17 MED ORDER — METHYLERGONOVINE MALEATE 0.2 MG PO TABS: 0.2000 mg | ORAL_TABLET | Freq: Three times a day (TID) | ORAL | Status: DC

## 2015-08-17 MED ORDER — ACETAMINOPHEN 325 MG PO TABS
650.0000 mg | ORAL_TABLET | Freq: Once | ORAL | Status: AC
  Administered 2015-08-17: 650 mg via ORAL
  Filled 2015-08-17: qty 2

## 2015-08-17 MED ORDER — DIPHENHYDRAMINE HCL 25 MG PO CAPS
25.0000 mg | ORAL_CAPSULE | Freq: Once | ORAL | Status: AC
  Administered 2015-08-17: 25 mg via ORAL
  Filled 2015-08-17: qty 1

## 2015-08-17 MED ORDER — SODIUM CHLORIDE 0.9 % IV SOLN
Freq: Once | INTRAVENOUS | Status: AC
  Administered 2015-08-17: 09:00:00 via INTRAVENOUS

## 2015-08-17 MED ORDER — FERROUS SULFATE 325 (65 FE) MG PO TABS
325.0000 mg | ORAL_TABLET | Freq: Three times a day (TID) | ORAL | Status: DC
  Administered 2015-08-17 (×2): 325 mg via ORAL
  Filled 2015-08-17 (×2): qty 1

## 2015-08-17 MED ORDER — DOCUSATE SODIUM 100 MG PO CAPS
100.0000 mg | ORAL_CAPSULE | Freq: Two times a day (BID) | ORAL | Status: DC
  Administered 2015-08-17 (×2): 100 mg via ORAL
  Filled 2015-08-17 (×2): qty 1

## 2015-08-17 NOTE — Discharge Summary (Signed)
Physician Discharge Summary  Patient ID: Stacy NashDesiree M Mantell MRN: [id number] DOB/AGE: 22/02/1991 22 y.o.  Admit date: 08/16/2015 Discharge date: 08/17/2015  Admission Diagnoses: Delayed post partum hemorrhage  Discharge Diagnoses:  Principal Problem:   Anemia in pregancy, deliv, with postpartum complic, curr hospitaliz Active Problems:   History of gestational hypertension   Drug use complicating pregnancy in second trimester   History of suicidal ideation   Short interval between pregnancies affecting pregnancy, antepartum   S/P emergency cesarean section   Postpartum hemorrhage, delayed (> 24 hrs)   Discharged Condition: stable  Hospital Course: patient had been discharged 3 days prior to admission.  She does undergone a repeat cesarean section due to uterine rupture.  She had been doing well but had us sudden large amount of vaginal bleeding.  Came symptomatic at home is brought to the MAU and found to be significantly anemic.  Hemoglobin was 6.9 she's received 4 units of blood.  Hemoglobin equilibrated to 8.5 hematocrit 25.2.  She is asymptomatic walking around 1 outside smoke cigarettes having no bleeding at this point after a Methergine series.  She is discharged home to [facility]  Consults: None  Significant Diagnostic Studies: labs:    Treatments: transfusion  Discharge Exam: Blood pressure 123/51, pulse 113, temperature 98.9 F (37.2 C), temperature source Oral, resp. rate 20, height 5\' 9"  (1.753 m), last menstrual period 10/22/2014, SpO2 100 %, unknown if currently breastfeeding. General appearance: alert, cooperative and no distress GI: soft, non-tender; bowel sounds normal; no masses,  no organomegaly Incision/Wound:clean dry intact  Disposition: 01-Home or Self Care  Discharge Instructions    Call MD for:  persistant nausea and vomiting    Complete by:  As directed      Call MD for:  temperature >100.4    Complete by:  As  directed      Call MD for:    Complete by:  As directed   Excessive bleeding     Diet - low sodium heart healthy    Complete by:  As directed      Discharge wound care:    Complete by:  As directed   Keep clean and dry     Increase activity slowly    Complete by:  As directed      Sexual Activity Restrictions    Complete by:  As directed   No sex for 6 weeks            Medication List    TAKE these medications        acetaminophen 500 MG tablet  Commonly known as:  TYLENOL  Take 1,000 mg by mouth every 6 (six) hours as needed for headache.     methylergonovine 0.2 MG tablet  Commonly known as:  METHERGINE  Take 1 tablet (0.2 mg total) by mouth 3 (three) times daily.     oxyCODONE-acetaminophen 5-325 MG tablet  Commonly known as:  PERCOCET/ROXICET  Take 1 tablet by mouth every 4 (four) hours as needed (for pain scale 4-7).           Follow-up Information    [facility] In 1 week.   Specialty:  Obstetrics and Gynecology   Why:  post op visit   Contact information:   [address], [address] [place] [id number] [phone]      Signed: Lazaro ArmsURE,Shakeem Stern H 08/17/2015, 10:15 PM

## 2015-08-17 NOTE — Progress Notes (Signed)
Faculty Practice OB/GYN Attending Note  Subjective:  Patient has a critical hemoglobin of 6.6 after transfusion yesterday. Patient reports feeling very tired.  She is able to ambulate to bathroom and walk around; no presyncopal symptoms.  Breastfeeding/formula feeding. Baby in room. Minimal bleeding.  Admitted on 08/16/2015 for Anemia in pregancy, delivered, with postpartum complication of delayed PPH after cesarean section for uterine rupture on 08/11/2015. Had transfusion of 2 units of pRBcs yesterday.  Of note, she had also received 2 units of pRBCs after cesarean section secondary to bleeding from the uterine rupture.   Objective:  Blood pressure 132/52, pulse 102, temperature 98.4 F (36.9 C), temperature source Oral, resp. rate 18, last menstrual period 10/22/2014, SpO2 100 %, unknown if currently breastfeeding. Orthostatic vital signs normal  Gen: NAD HENT: Normocephalic, atraumatic Lungs: Normal respiratory effort Heart: Tachycardic, regular rhythm Abdomen: Clean Honeycomb dressing in place, abdomen soft, +BS, nontender Pelvic: Minimal blood on pad Ext: 2+ DTRs, 1+ edema BLE, no cyanosis, negative Homan's sign  CBC Latest Ref Rng 08/17/2015 08/16/2015 08/16/2015  WBC 4.0 - 10.5 K/uL 12.3(H) 14.3(H) 8.5  Hemoglobin 12.0 - 15.0 g/dL 6.6(LL) 7.8(L) 6.9(LL)  Hematocrit 36.0 - 46.0 % 19.2(L) 22.2(L) 19.8(L)  Platelets 150 - 400 K/uL 188 190 237    Assessment & Plan:  24 y.o. [id number] POD#6 s/p cesarean section for uterine rupture, admitted on 08/16/15 after delayed PPH requiring transfusion; after having initial bleeding after uterine rupture that also required transfusion.   Patient had significant blood loss, no active bleeding.  Benign abdomen.  Counseled patient about her anemia; have not replaced her significant blood loss.  Recommended transfusion of 2 additional units of pRBCs, patient agrees to this plan.  Order placed.  Oral iron therapy initiated. Will reevaluate later, may  be able to go home later today if stable.    Continue close inpatient observation for now.  Jaynie CollinsUGONNA  Younis Mathey, MD, FACOG Attending Obstetrician & Gynecologist Faculty Practice, [facility]

## 2015-08-17 NOTE — Discharge Instructions (Signed)
Postpartum Hemorrhage °Postpartum hemorrhage is excessive blood loss after childbirth. Some blood loss is normal after delivering a baby. However, postpartum hemorrhage is a potentially serious condition.  °CAUSES  °· A loss of muscle tone in the uterus after childbirth. °· Failure to deliver all of the placenta. °· Wounds in the birth canal caused by delivery of the fetus. °· A maternal bleeding disorder that prevents blood clotting (rare). °RISK FACTORS °You are at greater risk for postpartum hemorrhage if you: °· Have a history of postpartum hemorrhage. °· Have delivered more than one baby. °· Had preeclampsia or eclampsia. °· Had problems with the placenta. °· Had complications during your labor or delivery. °· Are obese. °· Are Asian or Hispanic. °SIGNS AND SYMPTOMS  °Vaginal bleeding after delivery is normal and should be expected. Bleeding (lochia) will occur for several days after childbirth. This can be expected with normal vaginal deliveries and cesarean deliveries.  °You are bleeding too much after your delivery if you are: °· Passing large clots or pieces of tissue. This may be small pieces of placenta left after delivery.   °· Soaking more than one sanitary pad per hour for several hours.   °· Having heavy, bright-red bleeding that occurs 4 days or more after delivery.   °· Having a discharge that has a bad smell or if you begin to run an unexplained fever.   °· Having times of lightheadedness or fainting, feeling short of breath, or having your heart beat fast with very little activity.   °DIAGNOSIS  °A diagnosis is based on your symptoms and a physical exam of your perineum, vagina, cervix, and uterus. Diagnostic tests may include: °· Blood pressure and pulse. °· Blood tests. °· Blood clotting tests. °· Ultrasonography. °TREATMENT °· Treatment is based on the severity of bleeding and may include: °¨ Uterine massage. °¨ Medicines. °¨ Blood transfusions. °· Sometimes bleeding occurs if portions of the  placenta are left behind in the uterus after delivery. If this happens, often a curettage or scraping of the inside of the uterus must be done. This usually stops the bleeding. If this treatment does not stop the bleeding, surgery (hysterectomy) may have to be performed to remove the uterus. °· If bleeding is due to clotting or bleeding problems that are not related to the pregnancy, other treatments may be needed.   °HOME CARE INSTRUCTIONS  °· Limit your activity as directed by your health care provider. Your health care provider may order bed rest (getting up to the bathroom only) or may allow you to continue light activity.   °· Keep track of the number of pads you use each day and how soaked (saturated) they are. Write this number down.   °· Do not use tampons. Do not douche or have sexual intercourse until approved by your health care provider.   °· Drink enough fluids to keep your urine clear or pale yellow.   °· Get proper amounts of rest.   °· Eat foods that are rich in iron, such as spinach, red meat, and legumes.   °SEEK IMMEDIATE MEDICAL CARE IF: °· You experience severe cramps in your stomach, back, or belly (abdomen).   °· You have a fever.   °· You pass large clots or tissue. Save any tissue for your health care provider to look at.   °· Your bleeding increases. °· You become weak or lightheaded, or you pass out.   °· Your sanitary pad count per hour is increasing. °MAKE SURE YOU: °· Understand these instructions. °· Will watch your condition. °· Will get help right away if   you are not doing well or get worse. °  °This information is not intended to replace advice given to you by your health care provider. Make sure you discuss any questions you have with your health care provider. °  °Document Released: 12/29/2003 Document Revised: 10/13/2013 Document Reviewed: 03/26/2013 °Elsevier Interactive Patient Education ©2016 [employer] ° °

## 2015-08-17 NOTE — Progress Notes (Signed)
Pt A/O x4. Vital signs WNL. Pain controlled. Belongings accounted for. Reviewed discharge instructions and answered patients questions. Pt accompanied by nursing staff to private vehicle.

## 2015-08-17 NOTE — Progress Notes (Signed)
Critical Lab:  Lab called at 0658 reporting pt's hemoglobin 6.6 .  Resident notified Darl PikesSusan. At 0705.  MTDailey RN

## 2015-08-18 LAB — TYPE AND SCREEN
ABO/RH(D): AB POS
ANTIBODY SCREEN: NEGATIVE
UNIT DIVISION: 0
UNIT DIVISION: 0
UNIT DIVISION: 0
Unit division: 0
Unit division: 0
Unit division: 0

## 2015-08-28 ENCOUNTER — Inpatient Hospital Stay (HOSPITAL_COMMUNITY): Payer: Medicaid Other

## 2015-08-28 ENCOUNTER — Inpatient Hospital Stay (HOSPITAL_COMMUNITY)
Admission: AD | Admit: 2015-08-28 | Discharge: 2015-08-28 | Disposition: A | Discharge status: Home / Self Care | Payer: Medicaid Other | Source: Ambulatory Visit | Attending: Obstetrics and Gynecology | Admitting: Obstetrics and Gynecology

## 2015-08-28 DIAGNOSIS — F17210 Nicotine dependence, cigarettes, uncomplicated: Secondary | ICD-10-CM | POA: Diagnosis not present

## 2015-08-28 DIAGNOSIS — N644 Mastodynia: Secondary | ICD-10-CM | POA: Diagnosis present

## 2015-08-28 DIAGNOSIS — R51 Headache: Secondary | ICD-10-CM | POA: Insufficient documentation

## 2015-08-28 DIAGNOSIS — O8612 Endometritis following delivery: Secondary | ICD-10-CM | POA: Insufficient documentation

## 2015-08-28 DIAGNOSIS — R0602 Shortness of breath: Secondary | ICD-10-CM | POA: Insufficient documentation

## 2015-08-28 DIAGNOSIS — O9122 Nonpurulent mastitis associated with the puerperium: Secondary | ICD-10-CM | POA: Insufficient documentation

## 2015-08-28 DIAGNOSIS — M549 Dorsalgia, unspecified: Secondary | ICD-10-CM | POA: Insufficient documentation

## 2015-08-28 DIAGNOSIS — N61 Mastitis without abscess: Secondary | ICD-10-CM

## 2015-08-28 DIAGNOSIS — N390 Urinary tract infection, site not specified: Secondary | ICD-10-CM

## 2015-08-28 DIAGNOSIS — O8620 Urinary tract infection following delivery, unspecified: Secondary | ICD-10-CM | POA: Insufficient documentation

## 2015-08-28 DIAGNOSIS — O864 Pyrexia of unknown origin following delivery: Secondary | ICD-10-CM

## 2015-08-28 DIAGNOSIS — I10 Essential (primary) hypertension: Secondary | ICD-10-CM | POA: Diagnosis not present

## 2015-08-28 LAB — PROTEIN / CREATININE RATIO, URINE
CREATININE, URINE: 364 mg/dL
PROTEIN CREATININE RATIO: 0.15 mg/mg{creat} (ref 0.00–0.15)
TOTAL PROTEIN, URINE: 53 mg/dL

## 2015-08-28 LAB — CBC
HCT: 28.4 % — ABNORMAL LOW (ref 36.0–46.0)
HEMOGLOBIN: 9.3 g/dL — AB (ref 12.0–15.0)
MCH: 29.1 pg (ref 26.0–34.0)
MCHC: 32.7 g/dL (ref 30.0–36.0)
MCV: 88.8 fL (ref 78.0–100.0)
Platelets: 483 10*3/uL — ABNORMAL HIGH (ref 150–400)
RBC: 3.2 MIL/uL — ABNORMAL LOW (ref 3.87–5.11)
RDW: 13.3 % (ref 11.5–15.5)
WBC: 17.1 10*3/uL — ABNORMAL HIGH (ref 4.0–10.5)

## 2015-08-28 LAB — URINALYSIS, ROUTINE W REFLEX MICROSCOPIC
Glucose, UA: NEGATIVE mg/dL
Ketones, ur: NEGATIVE mg/dL
Nitrite: NEGATIVE
Protein, ur: 30 mg/dL — AB
Specific Gravity, Urine: 1.03 (ref 1.005–1.030)
Urobilinogen, UA: 0.2 mg/dL (ref 0.0–1.0)
pH: 5.5 (ref 5.0–8.0)

## 2015-08-28 LAB — COMPREHENSIVE METABOLIC PANEL
ALT: 9 U/L — ABNORMAL LOW (ref 14–54)
AST: 14 U/L — ABNORMAL LOW (ref 15–41)
Albumin: 3.2 g/dL — ABNORMAL LOW (ref 3.5–5.0)
Alkaline Phosphatase: 58 U/L (ref 38–126)
Anion gap: 9 (ref 5–15)
BUN: 12 mg/dL (ref 6–20)
CO2: 25 mmol/L (ref 22–32)
Calcium: 8.8 mg/dL — ABNORMAL LOW (ref 8.9–10.3)
Chloride: 106 mmol/L (ref 101–111)
Creatinine, Ser: 0.81 mg/dL (ref 0.44–1.00)
Glucose, Bld: 102 mg/dL — ABNORMAL HIGH (ref 65–99)
Potassium: 4 mmol/L (ref 3.5–5.1)
Sodium: 140 mmol/L (ref 135–145)
Total Bilirubin: 0.6 mg/dL (ref 0.3–1.2)
Total Protein: 6.5 g/dL (ref 6.5–8.1)

## 2015-08-28 LAB — URINE MICROSCOPIC-ADD ON

## 2015-08-28 LAB — LACTATE DEHYDROGENASE: LDH: 271 U/L — AB (ref 98–192)

## 2015-08-28 LAB — URIC ACID: URIC ACID, SERUM: 6.5 mg/dL (ref 2.3–6.6)

## 2015-08-28 MED ORDER — IOHEXOL 300 MG/ML  SOLN: 50.0000 mL | INTRAMUSCULAR | Status: AC

## 2015-08-28 MED ORDER — AMOXICILLIN-POT CLAVULANATE 875-125 MG PO TABS: 1.0000 | ORAL_TABLET | Freq: Two times a day (BID) | ORAL | Status: DC

## 2015-08-28 MED ORDER — KETOROLAC TROMETHAMINE 30 MG/ML IJ SOLN
30.0000 mg | Freq: Once | INTRAMUSCULAR | Status: AC
  Administered 2015-08-28: 30 mg via INTRAVENOUS
  Filled 2015-08-28: qty 1

## 2015-08-28 MED ORDER — DEXTROSE 5 % IV SOLN
2.0000 g | INTRAVENOUS | Status: DC
  Administered 2015-08-28: 2 g via INTRAVENOUS
  Filled 2015-08-28: qty 2

## 2015-08-28 MED ORDER — OXYCODONE-ACETAMINOPHEN 5-325 MG PO TABS: 1.0000 | ORAL_TABLET | ORAL | Status: DC | PRN

## 2015-08-28 MED ORDER — IBUPROFEN 600 MG PO TABS: 600.0000 mg | ORAL_TABLET | Freq: Four times a day (QID) | ORAL | Status: DC | PRN

## 2015-08-28 MED ORDER — OXYCODONE-ACETAMINOPHEN 5-325 MG PO TABS
1.0000 | ORAL_TABLET | ORAL | Status: AC
  Administered 2015-08-28: 1 via ORAL
  Filled 2015-08-28: qty 1

## 2015-08-28 MED ORDER — IOHEXOL 350 MG/ML SOLN
100.0000 mL | Freq: Once | INTRAVENOUS | Status: AC | PRN
  Administered 2015-08-28: 100 mL via INTRAVENOUS

## 2015-08-28 NOTE — MAU Note (Signed)
Pt had CS on 08/11/15. She reports dysuria, fever x 2 days, HA, back pain, breast engorgement and body aches.

## 2015-08-28 NOTE — MAU Provider Note (Signed)
History     CSN: [id number]  Arrival date and time: 08/28/15 0147   First Provider Initiated Contact with Patient 08/28/15 0242      Chief Complaint  Patient presents with  . Headache  . Fever  . Dysuria  . Back Pain   HPI Garner NashDesiree M Chhim 24 y.o. [id number] postpartum female presents with multiple complaints.  She was induced on 08/10/15 for preeclampsia although she had an emergent c-section for uterine rupture.  She required 6 units of blood.  Over the last week her symptoms have been worsening. This morning she is complaining of fever (101 at home), headache 8/10, pain with urination, back pain, abdominal pain and breast pain.  She last used percocet at 5pm on 11/5.   It was minimally controlling pain symptoms.   She last tried to pump shortly after that but noted it was just too painful.  She has not fully emptied her breasts in 2 days.  She notices her urine is very dark but no blood or odor.  She does not drink much at all.   Her abdomen is moderately painful unless she moves or is touched when it becomes severely tender.  She has become more short of breath over the last few days and often has to sit and rest to catch her breath.  No SOB at rest.  She does have 4411 month old son with fever of unknown origin.  She denies chest pain, nausea, vomiting.  She also complains of general fatigue, sluggishness.   OB History    Gravida Para Term Preterm AB TAB SAB Ectopic Multiple Living   5 3 3  0 2 0 2 0 0 3      Past Medical History  Diagnosis Date  . Hx of tonsillectomy   . S/P cesarean section 05/25/2012  . Seizures [facility](HCC)     age [age]- after fell and hit head  . Hypertension     on meds 2011  . Anxiety   . Mental disorder   . Depression   . Bipolar affective (HCC)   . ADD (attention deficit disorder)   . Polysubstance abuse 10/2014    Ectasy use  . History of marijuana use   . History of cocaine use 10/2014  . History of suicidal ideation   . Smoker   . History of VBAC   .  History of gestational diabetes mellitus (GDM)     with prior pregnancy  . Hx of eye surgery 02/2015    Past Surgical History  Procedure Laterality Date  . Tonsillectomy    . Cesarean section  05/25/2012    Procedure: CESAREAN SECTION;  Surgeon: Sherron MondayJody Bovard, MD;  Location: [facility];  Service: Gynecology;  Laterality: N/A;  Primary cesarean section with delivery of baby girl at 0700. Apgars9/9.  Marland Kitchen. Orif orbital fracture Left 02/25/2015    Procedure: OPEN REDUCTION INTERNAL FIXATION (ORIF) LEFT ORBITAL FRACTURE;  Surgeon: Melvenia BeamMitchell Gore, MD;  [facility];  Service: ENT;  Laterality: Left;  . Cesarean section N/A 08/11/2015    Procedure: CESAREAN SECTION;  Surgeon: Lazaro ArmsLuther H Eure, MD;  Location: [facility];  Service: Obstetrics;  Laterality: N/A;    Family History  Problem Relation Age of Onset  . Anesthesia problems Neg Hx   . Cancer Maternal Grandmother   . Hypertension Maternal Grandmother   . Hypertension Mother     Social History  Substance Use Topics  . Smoking status: Current Every Day Smoker -- 0.25 packs/day for 10  years    Types: Cigarettes  . Smokeless tobacco: Never Used  . Alcohol Use: No     Comment: Former EtOH abuse    Allergies: No Known Allergies  Prescriptions prior to admission  Medication Sig Dispense Refill Last Dose  . acetaminophen (TYLENOL) 500 MG tablet Take 1,000 mg by mouth every 6 (six) hours as needed for headache.    Past Week at Unknown time  . oxyCODONE-acetaminophen (PERCOCET/ROXICET) 5-325 MG tablet Take 1 tablet by mouth every 4 (four) hours as needed (for pain scale 4-7). 30 tablet 0 Past Week at Unknown time  . methylergonovine (METHERGINE) 0.2 MG tablet Take 1 tablet (0.2 mg total) by mouth 3 (three) times daily. 9 tablet 0     ROS Pertinent ROS in HPI.  All other systems are negative.   Physical Exam   Blood pressure 143/80, pulse 90, temperature 98.7 F (37.1 C), temperature source Axillary, resp. rate 20, height  (1.753 m), weight 215  lb (97.523 kg), SpO2 100 %, unknown if currently breastfeeding.  Physical Exam  Constitutional: She is oriented to person, place, and time. She appears well-developed and well-nourished. No distress.  HENT:  Head: Normocephalic and atraumatic.  Eyes: Conjunctivae and EOM are normal.  Neck: Normal range of motion. Neck supple.  Cardiovascular: Normal rate and normal heart sounds.   Respiratory: Effort normal and breath sounds normal. No respiratory distress.  GI: Soft. She exhibits no distension. There is tenderness. There is guarding.  Exquisite tenderness bilat lower abdomen  Genitourinary:  Exquisite tenderness on bimanual exam - cannot tolerate any movement.  No bleeding noted on glove  Musculoskeletal: Normal range of motion. She exhibits no edema.  Neurological: She is alert and oriented to person, place, and time.  Skin: Skin is dry.  Skin feels extra-warm to touch  Psychiatric: She has a normal mood and affect. Her behavior is normal.  Breast: Left breast with area of firmness approx 2in in diameter - exquisite tenderness.  Entire breast is warm.  No erythema appreciated  Results for orders placed or performed during the hospital encounter of 08/28/15 (from the past 24 hour(s))  CBC     Status: Abnormal   Collection Time: 08/28/15  1:40 AM  Result Value Ref Range   WBC 17.1 (H) 4.0 - 10.5 K/uL   RBC 3.20 (L) 3.87 - 5.11 MIL/uL   Hemoglobin 9.3 (L) 12.0 - 15.0 g/dL   HCT 16.1 (L) 09.6 - 04.5 %   MCV 88.8 78.0 - 100.0 fL   MCH 29.1 26.0 - 34.0 pg   MCHC 32.7 30.0 - 36.0 g/dL   RDW 40.9 81.1 - 91.4 %   Platelets 483 (H) 150 - 400 K/uL  Comprehensive metabolic panel     Status: Abnormal   Collection Time: 08/28/15  1:40 AM  Result Value Ref Range   Sodium 140 135 - 145 mmol/L   Potassium 4.0 3.5 - 5.1 mmol/L   Chloride 106 101 - 111 mmol/L   CO2 25 22 - 32 mmol/L   Glucose, Bld 102 (H) 65 - 99 mg/dL   BUN 12 6 - 20 mg/dL   Creatinine, Ser 7.82 0.44 - 1.00 mg/dL    Calcium 8.8 (L) 8.9 - 10.3 mg/dL   Total Protein 6.5 6.5 - 8.1 g/dL   Albumin 3.2 (L) 3.5 - 5.0 g/dL   AST 14 (L) 15 - 41 U/L   ALT 9 (L) 14 - 54 U/L   Alkaline Phosphatase 58 38 -  126 U/L   Total Bilirubin 0.6 0.3 - 1.2 mg/dL   Anion gap 9.0 5 - 15  Lactate dehydrogenase     Status: Abnormal   Collection Time: 08/28/15  1:40 AM  Result Value Ref Range   LDH 271 (H) 98 - 192 U/L  Protein / creatinine ratio, urine     Status: None   Collection Time: 08/28/15  1:40 AM  Result Value Ref Range   Creatinine, Urine 364 mg/dL   Total Protein, Urine 53 mg/dL   Protein Creatinine Ratio 0.15 0.00 - 0.15 mg/mg[Cre]  Uric acid     Status: None   Collection Time: 08/28/15  1:40 AM  Result Value Ref Range   Uric Acid, Serum 6.5 2.3 - 6.6 mg/dL  Urinalysis, Routine w reflex microscopic (not at [facility])     Status: Abnormal   Collection Time: 08/28/15  2:00 AM  Result Value Ref Range   Color, Urine YELLOW YELLOW   APPearance HAZY (A) CLEAR   Specific Gravity, Urine 1.030 1.005 - 1.030   pH 5.5 5.0 - 8.0   Glucose, UA NEGATIVE NEGATIVE mg/dL   Hgb urine dipstick LARGE (A) NEGATIVE   Bilirubin Urine SMALL (A) NEGATIVE   Ketones, ur NEGATIVE NEGATIVE mg/dL   Protein, ur 30 (A) NEGATIVE mg/dL   Urobilinogen, UA 0.2 0.0 - 1.0 mg/dL   Nitrite NEGATIVE NEGATIVE   Leukocytes, UA SMALL (A) NEGATIVE  Urine microscopic-add on     Status: Abnormal   Collection Time: 08/28/15  2:00 AM  Result Value Ref Range   Squamous Epithelial / LPF RARE RARE   WBC, UA 11-20 <3 WBC/hpf   RBC / HPF 0-2 <3 RBC/hpf   Bacteria, UA FEW (A) RARE   Urine-Other MUCOUS PRESENT    Ct Angio Chest Pe W/cm &/or Wo Cm  08/28/2015  CLINICAL DATA:  Dysuria and fever for 2 days. Leukocytosis. Cesarean section on 08/11/2015. EXAM: CT ANGIOGRAPHY CHEST, ABDOMEN AND PELVIS TECHNIQUE: Multidetector CT imaging through the chest, abdomen and pelvis was performed using the standard protocol during bolus administration of intravenous  contrast. Multiplanar reconstructed images and MIPs were obtained and reviewed to evaluate the vascular anatomy. CONTRAST:  OMNIPAQUE IOHEXOL 350 MG/ML SOLN COMPARISON:  None. FINDINGS: CTA CHEST FINDINGS The thoracic aorta is normal in caliber and intact. There is no dissection. The pulmonary arteries are well opacified. There is no pulmonary embolism. Review of the MIP images confirms the above findings. The lungs are clear except for minimal atelectatic appearing posterior base opacities. Central airways are patent. There is no adenopathy in the mediastinum or hila. There are no pleural effusions. CTA ABDOMEN AND PELVIS FINDINGS The abdominal aorta is normal in caliber and intact. Review of the MIP images confirms the above findings. There are normal appearances of the liver, gallbladder, pancreas, spleen, adrenals and kidneys. Bowel is unremarkable. The postpartum uterus is grossly unremarkable. The Cesarean section scar is visible at the anterior surface of the lower uterine segment. There is a small amount of fluid around the uterus but there is no evidence of a drainable hematoma or abscess. The low transverse abdominal incision is unremarkable, with no evidence of a subcutaneous collection. IMPRESSION: 1. No acute findings in the chest except for minimal atelectatic appearing posterior base opacities. 2. Small volume fluid surrounding the postpartum uterus without drainable abscess or hematoma. Electronically Signed   By: Ellery Plunk M.D.   On: 08/28/2015 06:51   Ct Abdomen Pelvis W Contrast  08/28/2015  CLINICAL DATA:  Dysuria and fever for 2 days. Leukocytosis. Cesarean section on 08/11/2015. EXAM: CT ANGIOGRAPHY CHEST, ABDOMEN AND PELVIS TECHNIQUE: Multidetector CT imaging through the chest, abdomen and pelvis was performed using the standard protocol during bolus administration of intravenous contrast. Multiplanar reconstructed images and MIPs were obtained and reviewed to evaluate the  vascular anatomy. CONTRAST:  OMNIPAQUE IOHEXOL 350 MG/ML SOLN COMPARISON:  None. FINDINGS: CTA CHEST FINDINGS The thoracic aorta is normal in caliber and intact. There is no dissection. The pulmonary arteries are well opacified. There is no pulmonary embolism. Review of the MIP images confirms the above findings. The lungs are clear except for minimal atelectatic appearing posterior base opacities. Central airways are patent. There is no adenopathy in the mediastinum or hila. There are no pleural effusions. CTA ABDOMEN AND PELVIS FINDINGS The abdominal aorta is normal in caliber and intact. Review of the MIP images confirms the above findings. There are normal appearances of the liver, gallbladder, pancreas, spleen, adrenals and kidneys. Bowel is unremarkable. The postpartum uterus is grossly unremarkable. The Cesarean section scar is visible at the anterior surface of the lower uterine segment. There is a small amount of fluid around the uterus but there is no evidence of a drainable hematoma or abscess. The low transverse abdominal incision is unremarkable, with no evidence of a subcutaneous collection. IMPRESSION: 1. No acute findings in the chest except for minimal atelectatic appearing posterior base opacities. 2. Small volume fluid surrounding the postpartum uterus without drainable abscess or hematoma. Electronically Signed   By: Ellery Plunk M.D.   On: 08/28/2015 06:51    MAU Course  Procedures  MDM [facility] labs ordered due to borderline BP and h/o gest htn WBC ct elevated (17.5), platelets elevated U/A with 11-20 WBC  Discussed all lab results and pt presentation with Dr. Emelda Fear whom advises for IV rocephin 2 grams as well as Spiral CT, CT abdomen and pelvis.   CTs are negative Pt notes breast pain is resolved, feeling some better overall and back pain continued  Assessment and Plan  A: Mastitis  UTI Endometritis PP fever SOB  P: Discharge to home Augmentin bid x 10  days Ibuprofen  up to QID Percocet 5/325 #10 F/u in clinic Tuesday, 11/8 at 12:40pm Patient may return to MAU as needed or if her condition were to change or worsen    Bertram Denver 08/28/2015, 7:40 AM

## 2015-08-28 NOTE — Discharge Instructions (Signed)
Endometritis Endometritis is an irritation, soreness, and swelling (inflammation) of the lining of the uterus (endometrium).  CAUSES   Bacterial infections.  Sexually transmitted infections (STIs).  Having a miscarriage or childbirth, especially after a long labor or cesarean delivery.  Certain gynecological procedures (such as dilation and curettage, hysteroscopy, or contraceptive insertion). SIGNS AND SYMPTOMS   Fever.  Lower abdominal or pelvic pain.  Abnormal vaginal discharge or bleeding.  Abdominal bloating (distention) or swelling.  General discomfort or ill feeling.  Discomfort with bowel movements. DIAGNOSIS  A physical and pelvic exam are performed. Other tests may include:  Cultures from the cervix.  Blood tests.  Examining a tissue sample of the uterine lining (endometrial biopsy).  Examining discharge under a microscope (wet prep).  Laparoscopy. TREATMENT  Antibiotic medicines are usually given. Other treatments may include:  Fluids through an IV tube inserted in your vein.  Rest. HOME CARE INSTRUCTIONS   Take over-the-counter or prescription medicines for pain, discomfort, or fever as directed by your health care provider.  Take your antibiotics as directed. Finish them even if you start to feel better.  Resume your normal diet and activities as directed or as tolerated.  Do not douche or have sexual intercourse until your health care provider says it is okay.  Do not have sexual intercourse until your partner has been treated if your endometritis is caused by an STI. SEEK IMMEDIATE MEDICAL CARE IF:   You have swelling or increasing pain in the abdomen.  You have a fever.  You have bad smelling vaginal discharge, or you have an increased amount of discharge.  You have abnormal vaginal bleeding.  Your medicine is not helping with the pain.  You experience any problems that may be related to the medicine you are taking.  You have nausea  and vomiting, or you cannot keep foods down.  You have pain with bowel movements. MAKE SURE YOU:   Understand these instructions.  Will watch your condition.  Will get help right away if you are not doing well or get worse.   This information is not intended to replace advice given to you by your health care provider. Make sure you discuss any questions you have with your health care provider.   Document Released: 10/02/2001 Document Revised: 06/10/2013 Document Reviewed: 05/07/2013 Elsevier Interactive Patient Education 2016 Elsevier Inc. Breastfeeding and Mastitis Mastitis is inflammation of the breast tissue. It can occur in women who are breastfeeding. This can make breastfeeding painful. Mastitis will sometimes go away on its own. Your health care provider will help determine if treatment is needed. CAUSES Mastitis is often associated with a blocked milk (lactiferous) duct. This can happen when too much milk builds up in the breast. Causes of excess milk in the breast can include:  Poor latch-on. If your baby is not latched onto the breast properly, she or he may not empty your breast completely while breastfeeding.  Allowing too much time to pass between feedings.  Wearing a bra or other clothing that is too tight. This puts extra pressure on the lactiferous ducts so milk does not flow through them as it should. Mastitis can also be caused by a bacterial infection. Bacteria may enter the breast tissue through cuts or openings in the skin. In women who are breastfeeding, this may occur because of cracked or irritated skin. Cracks in the skin are often caused when your baby does not latch on properly to the breast. SIGNS AND SYMPTOMS  Swelling, redness, tenderness,  and pain in an area of the breast.  Swelling of the glands under the arm on the same side.  Fever may or may not accompany mastitis. If an infection is allowed to progress, a collection of pus (abscess) may  develop. DIAGNOSIS  Your health care provider can usually diagnose mastitis based on your symptoms and a physical exam. Tests may be done to help confirm the diagnosis. These may include:  Removal of pus from the breast by applying pressure to the area. This pus can be examined in the lab to determine which bacteria are present. If an abscess has developed, the fluid in the abscess can be removed with a needle. This can also be used to confirm the diagnosis and determine the bacteria present. In most cases, pus will not be present.  Blood tests to determine if your body is fighting a bacterial infection.  Mammogram or ultrasound tests to rule out other problems or diseases. TREATMENT  Mastitis that occurs with breastfeeding will sometimes go away on its own. Your health care provider may choose to wait 24 hours after first seeing you to decide whether a prescription medicine is needed. If your symptoms are worse after 24 hours, your health care provider will likely prescribe an antibiotic medicine to treat the mastitis. He or she will determine which bacteria are most likely causing the infection and will then select an appropriate antibiotic medicine. This is sometimes changed based on the results of tests performed to identify the bacteria, or if there is no response to the antibiotic medicine selected. Antibiotic medicines are usually given by mouth. You may also be given medicine for pain. HOME CARE INSTRUCTIONS  Only take over-the-counter or prescription medicines for pain, fever, or discomfort as directed by your health care provider.  If your health care provider prescribed an antibiotic medicine, take the medicine as directed. Make sure you finish it even if you start to feel better.  Do not wear a tight or underwire bra. Wear a soft, supportive bra.  Increase your fluid intake, especially if you have a fever.  Continue to empty the breast. Your health care provider can tell you whether  this milk is safe for your infant or needs to be thrown out. You may be told to stop nursing until your health care provider thinks it is safe for your baby. Use a breast pump if you are advised to stop nursing.  Keep your nipples clean and dry.  Empty the first breast completely before going to the other breast. If your baby is not emptying your breasts completely for some reason, use a breast pump to empty your breasts.  If you go back to work, pump your breasts while at work to stay in time with your nursing schedule.  Avoid allowing your breasts to become overly filled with milk (engorged). SEEK MEDICAL CARE IF:  You have pus-like discharge from the breast.  Your symptoms do not improve with the treatment prescribed by your health care provider within 2 days. SEEK IMMEDIATE MEDICAL CARE IF:  Your pain and swelling are getting worse.  You have pain that is not controlled with medicine.  You have a red line extending from the breast toward your armpit.  You have a fever or persistent symptoms for more than 2-3 days.  You have a fever and your symptoms suddenly get worse. MAKE SURE YOU:   Understand these instructions.  Will watch your condition.  Will get help right away if you are not  doing well or get worse.   This information is not intended to replace advice given to you by your health care provider. Make sure you discuss any questions you have with your health care provider.   Document Released: 02/02/2005 Document Revised: 10/13/2013 Document Reviewed: 05/14/2013 Elsevier Interactive Patient Education [employer]2016 Elsevier Inc.

## 2015-08-29 LAB — URINE CULTURE

## 2015-08-30 ENCOUNTER — Encounter: Payer: Self-pay | Admitting: Advanced Practice Midwife

## 2015-08-30 ENCOUNTER — Ambulatory Visit: Payer: Medicaid Other | Admitting: Advanced Practice Midwife

## 2015-08-31 ENCOUNTER — Inpatient Hospital Stay (HOSPITAL_COMMUNITY)
Admission: AD | Admit: 2015-08-31 | Discharge: 2015-09-04 | DRG: 769 | Disposition: A | Discharge status: Home / Self Care | Payer: Medicaid Other | Source: Ambulatory Visit | Attending: Obstetrics & Gynecology | Admitting: Obstetrics & Gynecology

## 2015-08-31 ENCOUNTER — Inpatient Hospital Stay (HOSPITAL_COMMUNITY): Payer: Medicaid Other

## 2015-08-31 ENCOUNTER — Encounter (HOSPITAL_COMMUNITY)
Admission: AD | Disposition: A | Discharge status: Home / Self Care | Payer: Self-pay | Source: Ambulatory Visit | Attending: Family Medicine

## 2015-08-31 ENCOUNTER — Encounter (HOSPITAL_COMMUNITY): Payer: Self-pay | Admitting: Anesthesiology

## 2015-08-31 ENCOUNTER — Encounter (HOSPITAL_COMMUNITY): Payer: Self-pay

## 2015-08-31 DIAGNOSIS — N939 Abnormal uterine and vaginal bleeding, unspecified: Secondary | ICD-10-CM | POA: Diagnosis present

## 2015-08-31 DIAGNOSIS — F988 Other specified behavioral and emotional disorders with onset usually occurring in childhood and adolescence: Secondary | ICD-10-CM | POA: Diagnosis present

## 2015-08-31 DIAGNOSIS — D62 Acute posthemorrhagic anemia: Secondary | ICD-10-CM | POA: Diagnosis present

## 2015-08-31 DIAGNOSIS — O99335 Smoking (tobacco) complicating the puerperium: Secondary | ICD-10-CM | POA: Diagnosis present

## 2015-08-31 DIAGNOSIS — O9081 Anemia of the puerperium: Secondary | ICD-10-CM | POA: Diagnosis present

## 2015-08-31 DIAGNOSIS — F17210 Nicotine dependence, cigarettes, uncomplicated: Secondary | ICD-10-CM | POA: Diagnosis present

## 2015-08-31 DIAGNOSIS — Z8632 Personal history of gestational diabetes: Secondary | ICD-10-CM | POA: Diagnosis not present

## 2015-08-31 DIAGNOSIS — Z98891 History of uterine scar from previous surgery: Secondary | ICD-10-CM

## 2015-08-31 DIAGNOSIS — R300 Dysuria: Secondary | ICD-10-CM | POA: Diagnosis not present

## 2015-08-31 DIAGNOSIS — R4701 Aphasia: Secondary | ICD-10-CM | POA: Diagnosis not present

## 2015-08-31 DIAGNOSIS — O9089 Other complications of the puerperium, not elsewhere classified: Secondary | ICD-10-CM | POA: Diagnosis not present

## 2015-08-31 DIAGNOSIS — O9943 Diseases of the circulatory system complicating the puerperium: Secondary | ICD-10-CM | POA: Diagnosis present

## 2015-08-31 DIAGNOSIS — O99345 Other mental disorders complicating the puerperium: Secondary | ICD-10-CM | POA: Diagnosis present

## 2015-08-31 DIAGNOSIS — I728 Aneurysm of other specified arteries: Secondary | ICD-10-CM | POA: Diagnosis present

## 2015-08-31 DIAGNOSIS — R200 Anesthesia of skin: Secondary | ICD-10-CM | POA: Diagnosis not present

## 2015-08-31 DIAGNOSIS — O1092 Unspecified pre-existing hypertension complicating childbirth: Secondary | ICD-10-CM | POA: Diagnosis present

## 2015-08-31 DIAGNOSIS — R4182 Altered mental status, unspecified: Secondary | ICD-10-CM

## 2015-08-31 LAB — RAPID URINE DRUG SCREEN, HOSP PERFORMED
Amphetamines: NOT DETECTED
BARBITURATES: NOT DETECTED
BENZODIAZEPINES: POSITIVE — AB
COCAINE: NOT DETECTED
Opiates: POSITIVE — AB
TETRAHYDROCANNABINOL: POSITIVE — AB

## 2015-08-31 LAB — CBC
HCT: 25.3 % — ABNORMAL LOW (ref 36.0–46.0)
HEMATOCRIT: 22.9 % — AB (ref 36.0–46.0)
HEMATOCRIT: 21.8 % — AB (ref 36.0–46.0)
HEMATOCRIT: 22.5 % — AB (ref 36.0–46.0)
HEMOGLOBIN: 7.4 g/dL — AB (ref 12.0–15.0)
HEMOGLOBIN: 7.2 g/dL — AB (ref 12.0–15.0)
Hemoglobin: 8.6 g/dL — ABNORMAL LOW (ref 12.0–15.0)
Hemoglobin: 7.8 g/dL — ABNORMAL LOW (ref 12.0–15.0)
MCH: 28.2 pg (ref 26.0–34.0)
MCH: 29.3 pg (ref 26.0–34.0)
MCH: 29.5 pg (ref 26.0–34.0)
MCH: 29.9 pg (ref 26.0–34.0)
MCHC: 32.3 g/dL (ref 30.0–36.0)
MCHC: 33 g/dL (ref 30.0–36.0)
MCHC: 34 g/dL (ref 30.0–36.0)
MCHC: 34.7 g/dL (ref 30.0–36.0)
MCV: 87.4 fL (ref 78.0–100.0)
MCV: 88.6 fL (ref 78.0–100.0)
MCV: 86.6 fL (ref 78.0–100.0)
MCV: 86.2 fL (ref 78.0–100.0)
PLATELETS: 226 10*3/uL (ref 150–400)
Platelets: 473 10*3/uL — ABNORMAL HIGH (ref 150–400)
Platelets: 220 10*3/uL (ref 150–400)
Platelets: 211 10*3/uL (ref 150–400)
RBC: 2.62 MIL/uL — ABNORMAL LOW (ref 3.87–5.11)
RBC: 2.46 MIL/uL — ABNORMAL LOW (ref 3.87–5.11)
RBC: 2.92 MIL/uL — ABNORMAL LOW (ref 3.87–5.11)
RBC: 2.61 MIL/uL — ABNORMAL LOW (ref 3.87–5.11)
RDW: 13.8 % (ref 11.5–15.5)
RDW: 14.5 % (ref 11.5–15.5)
RDW: 13.9 % (ref 11.5–15.5)
RDW: 14.2 % (ref 11.5–15.5)
WBC: 13.1 10*3/uL — ABNORMAL HIGH (ref 4.0–10.5)
WBC: 13.2 10*3/uL — AB (ref 4.0–10.5)
WBC: 17.9 10*3/uL — AB (ref 4.0–10.5)
WBC: 16.1 10*3/uL — AB (ref 4.0–10.5)

## 2015-08-31 LAB — PROTIME-INR
INR: 1.23 (ref 0.00–1.49)
PROTHROMBIN TIME: 15.7 s — AB (ref 11.6–15.2)

## 2015-08-31 LAB — APTT: aPTT: 35 seconds (ref 24–37)

## 2015-08-31 LAB — PREPARE RBC (CROSSMATCH)

## 2015-08-31 LAB — FIBRINOGEN: Fibrinogen: 317 mg/dL (ref 204–475)

## 2015-08-31 SURGERY — LAPAROTOMY, EXPLORATORY
Anesthesia: General

## 2015-08-31 MED ORDER — SODIUM CHLORIDE 0.9 % IV SOLN: Freq: Once | INTRAVENOUS | Status: DC

## 2015-08-31 MED ORDER — HYDROMORPHONE HCL 1 MG/ML IJ SOLN
0.2000 mg | INTRAMUSCULAR | Status: DC | PRN
  Administered 2015-08-31 – 2015-09-01 (×6): 0.6 mg via INTRAVENOUS
  Filled 2015-08-31 (×6): qty 1

## 2015-08-31 MED ORDER — HYDROMORPHONE HCL 1 MG/ML IJ SOLN
1.0000 mg | Freq: Once | INTRAMUSCULAR | Status: AC
  Administered 2015-08-31: 1 mg via INTRAVENOUS

## 2015-08-31 MED ORDER — MIDAZOLAM HCL 2 MG/2ML IJ SOLN
INTRAMUSCULAR | Status: DC | PRN
  Administered 2015-08-31: 0.5 mg via INTRAVENOUS

## 2015-08-31 MED ORDER — HYDROMORPHONE HCL 2 MG/ML IJ SOLN: 2.0000 mg | Freq: Once | INTRAMUSCULAR | Status: DC

## 2015-08-31 MED ORDER — SODIUM CHLORIDE 0.9 % IV BOLUS (SEPSIS)
1000.0000 mL | Freq: Once | INTRAVENOUS | Status: AC
  Administered 2015-08-31: 1000 mL via INTRAVENOUS

## 2015-08-31 MED ORDER — LIDOCAINE HCL 1 % IJ SOLN
INTRAMUSCULAR | Status: AC
  Filled 2015-08-31: qty 20

## 2015-08-31 MED ORDER — HYDROMORPHONE HCL 1 MG/ML IJ SOLN
INTRAMUSCULAR | Status: AC
  Administered 2015-08-31: 2 mg
  Filled 2015-08-31: qty 2

## 2015-08-31 MED ORDER — MIDAZOLAM HCL 2 MG/2ML IJ SOLN
INTRAMUSCULAR | Status: AC
  Filled 2015-08-31: qty 4

## 2015-08-31 MED ORDER — FENTANYL CITRATE (PF) 100 MCG/2ML IJ SOLN
INTRAMUSCULAR | Status: AC
  Filled 2015-08-31: qty 4

## 2015-08-31 MED ORDER — FENTANYL CITRATE (PF) 100 MCG/2ML IJ SOLN
INTRAMUSCULAR | Status: DC | PRN
  Administered 2015-08-31: 25 ug via INTRAVENOUS

## 2015-08-31 MED ORDER — HYDROMORPHONE HCL 1 MG/ML IJ SOLN
INTRAMUSCULAR | Status: AC
  Filled 2015-08-31: qty 1

## 2015-08-31 MED ORDER — NALOXONE HCL 0.4 MG/ML IJ SOLN
INTRAMUSCULAR | Status: DC
Start: 2015-08-31 — End: 2015-08-31
  Filled 2015-08-31: qty 1

## 2015-08-31 MED ORDER — IOHEXOL 300 MG/ML  SOLN
450.0000 mL | Freq: Once | INTRAMUSCULAR | Status: DC | PRN
  Administered 2015-08-31: 150 mL via INTRAVENOUS
  Filled 2015-08-31: qty 450

## 2015-08-31 MED ORDER — PRENATAL MULTIVITAMIN CH
1.0000 | ORAL_TABLET | Freq: Every day | ORAL | Status: DC
  Administered 2015-08-31 – 2015-09-04 (×5): 1 via ORAL
  Filled 2015-08-31 (×6): qty 1

## 2015-08-31 MED ORDER — FLUMAZENIL 0.5 MG/5ML IV SOLN
INTRAVENOUS | Status: AC
  Filled 2015-08-31: qty 5

## 2015-08-31 MED ORDER — SODIUM CHLORIDE 0.9 % IV SOLN
INTRAVENOUS | Status: DC
  Administered 2015-08-31 – 2015-09-01 (×3): via INTRAVENOUS

## 2015-08-31 MED ORDER — NICOTINE 14 MG/24HR TD PT24
14.0000 mg | MEDICATED_PATCH | Freq: Every day | TRANSDERMAL | Status: DC
  Administered 2015-08-31 – 2015-09-03 (×4): 14 mg via TRANSDERMAL
  Filled 2015-08-31 (×5): qty 1

## 2015-08-31 NOTE — MAU Note (Signed)
Report called to RN on Women's unit. Pt transferred to 310 via stretcher with RN as advised by Harlon FlorJ. Wenzel, PA/ Dr. Jolayne Pantheronstant for blood transfusion.

## 2015-08-31 NOTE — Sedation Documentation (Signed)
Patient is resting comfortably. 

## 2015-08-31 NOTE — Progress Notes (Addendum)
Pt alert and oriented at present communicating with Dr. Jolayne Pantheronstant. Dr Clayborn BignessGermeroth, Elizabeth Speight,CRNA, Stoney BangNatalie Deal, RN and Fransisco BeauKristy Lashley, Rogelia Rohrerebecca McCall, and Rae LipsBrianna Jones remain at bedside.

## 2015-08-31 NOTE — Procedures (Signed)
Post right sided uterine artery embolization for post partum hemorrhage.   No immediate post procedural complications.   Keep right leg straight for 4 hrs.    SignedSimonne Come: Lavoris Canizales Pager: [phone]: [phone] 08/31/2015, 9:23 AM

## 2015-08-31 NOTE — Sedation Documentation (Signed)
Patient is resting comfortably. Procedure finished, pt tolerated well. [facility]

## 2015-08-31 NOTE — H&P (Signed)
CSN: [id number]   Arrival date and time: 08/31/15 4098    First Provider Initiated Contact with Patient 08/31/15 [phone]          Chief Complaint   Patient presents with   .  Vaginal Bleeding    HPI [name] is a 24 y.o. [id number] who delivered by emergency repeat C/S due to uterine rupture on 08/11/15. She was readmitted to [facility] on 08/16/15 with diagnosis of delayed PP hemorrhage and given 4 units of blood products. She states that tonight she woke up in a puddle of blood. She is having lower abdominal pain associated with the bleeding. She is feeling faint.     OB History      Gravida  Para  Term  Preterm  AB  TAB  SAB  Ectopic  Multiple  Living     0  2  0  2  0  0  3          Past Medical History   Diagnosis  Date   .  Hx of tonsillectomy     .  S/P cesarean section  05/25/2012   .  Seizures [facility])         age [age]- after fell and hit head   .  Hypertension         on meds 2011   .  Anxiety     .  Mental disorder     .  Depression     .  Bipolar affective (HCC)     .  ADD (attention deficit disorder)     .  Polysubstance abuse  10/2014       Ectasy use   .  History of marijuana use     .  History of cocaine use  10/2014   .  History of suicidal ideation     .  Smoker     .  History of VBAC     .  History of gestational diabetes mellitus (GDM)         with prior pregnancy   .  Hx of eye surgery  02/2015       Past Surgical History   Procedure  Laterality  Date   .  Tonsillectomy       .  Cesarean section    05/25/2012       Procedure: CESAREAN SECTION;  Surgeon: Sherron Monday, MD;  Location: [facility]S;  Service: Gynecology;  Laterality: N/A;  Primary cesarean section with delivery of baby girl at 0700. Apgars9/9.   Marland Kitchen  Orif orbital fracture  Left  02/25/2015       Procedure: OPEN REDUCTION INTERNAL FIXATION (ORIF) LEFT ORBITAL FRACTURE;  Surgeon: Melvenia Beam, MD;  Location: [facility] OR;  Service: ENT;  Laterality: Left;   .  Cesarean section  N/A  08/11/2015    Procedure: CESAREAN SECTION;  Surgeon: Lazaro Arms, MD;  Location: [facility] ORS;  Service: Obstetrics;  Laterality: N/A;       Family History   Problem  Relation  Age of Onset   .  Anesthesia problems  Neg Hx     .  Cancer  Maternal Grandmother     .  Hypertension  Maternal Grandmother     .  Hypertension  Mother         Social History   Substance Use Topics   .  Smoking status:  Current Every Day Smoker -- 0.25  packs/day for 10 years       Types:  Cigarettes   .  Smokeless tobacco:  Never Used   .  Alcohol Use:  No         Comment: Former EtOH abuse      Allergies: No Known Allergies    Prescriptions prior to admission   Medication  Sig  Dispense  Refill  Last Dose   .  amoxicillin-clavulanate (AUGMENTIN) 875-125 MG tablet  Take 1 tablet by mouth 2 (two) times daily.  20 tablet  0  08/30/2015 at 2100   .  ibuprofen (ADVIL,MOTRIN) 600 MG tablet  Take 1 tablet (600 mg total) by mouth every 6 (six) hours as needed.  30 tablet  0  08/30/2015 at 2100   .  oxyCODONE-acetaminophen (PERCOCET/ROXICET) 5-325 MG tablet  Take 1 tablet by mouth every 4 (four) hours as needed (for pain scale 4-7).  10 tablet  0  08/30/2015 at 2100   .  acetaminophen (TYLENOL) 500 MG tablet  Take 1,000 mg by mouth every 6 (six) hours as needed for headache.       Past Week at Unknown time      Review of Systems  Constitutional: Negative for fever.  Gastrointestinal: Positive for abdominal pain.  Genitourinary:        + vaginal bleeding  Physical Exam      Blood pressure 126/55, pulse 98, temperature 98.4 F (36.9 C), temperature source Axillary, resp. rate 18, unknown if currently breastfeeding.   Physical Exam  Constitutional: She is oriented to person, place, and time. She appears well-developed and well-nourished.  HENT:   Head: Normocephalic.  Cardiovascular: Normal rate.   Respiratory: Effort normal.  GI:  Abdomen is firm at the uterine fundus  Genitourinary: There is bleeding (heavy bleeding  with large clots ) in the vagina.  Neurological: She is alert and oriented to person, place, and time.  Skin: Skin is warm and dry. No erythema.  Psychiatric: She has a normal mood and affect.   Lab Results Last 24 Hours  Results for orders placed or performed during the hospital encounter of 08/31/15 (from the past 24 hour(s))   CBC     Status: Abnormal     Collection Time: 08/31/15  3:00 AM   Result  Value  Ref Range     WBC  13.1 (H)  4.0 - 10.5 K/uL     RBC  2.62 (L)  3.87 - 5.11 MIL/uL     Hemoglobin  7.4 (L)  12.0 - 15.0 g/dL     HCT  16.122.9 (L)  09.636.0 - 46.0 %     MCV  87.4  78.0 - 100.0 fL     MCH  28.2  26.0 - 34.0 pg     MCHC  32.3  30.0 - 36.0 g/dL     RDW  04.513.8  40.911.5 - 81.115.5 %     Platelets  473 (H)  150 - 400 K/uL         MAU Course    Procedures None   MDM CBC, ABO/Rh drawn Second IV access placed Dr. Jolayne Pantheronstant called to MAU for evaluation of patient 1 mg Dilaudid given for pain. Dr. Jolayne Pantheronstant to attempt manual removal of uterine clots Dr. Jolayne Pantheronstant would like bedside US and then admit patient to Women's Unit for 3 units of blood products.   Bedside US performed prior to transport to Women's Unit Assessment and Plan    A: Delayed PP  hemorrhage   P: Admit to Women's Unit for blood administration   Marny Lowenstein, PA-C   08/31/2015, 3:23 AM

## 2015-08-31 NOTE — ED Notes (Addendum)
Pt. [facility] , reports heavy vaginal bleeding onset 12 midnight with low abdominal pain and low back pain , pt. received 2 units of PRBC prior to arrival , pt. Is going to IR for arteriogram , PA from radiology dept. explained plan and procedure pt. and family at arrival.

## 2015-08-31 NOTE — Sedation Documentation (Signed)
Vital signs stable. 

## 2015-08-31 NOTE — Progress Notes (Signed)
Pt being prepared for transfer to [facility], Dr. Jolayne PantherOnstant discussing POC with pt

## 2015-08-31 NOTE — Progress Notes (Signed)
Patient ID: Garner NashDesiree M Bennison, female   DOB: 03/12/1991, 24 y.o.   MRN: [id number] [facility] Called by RN at that time around 5 am who reported that the patient was feeling faint. Blood transfusion had not begun yet. The patient reported feeling dizzy and lightheaded. Approximately 500 cc blood clots were evacuated from the vaginal vault along with a saturated chuck pad. The patient was later transferred to birthing suites where a code was called. Patient was unresponsive and hypotensive. Persistent vaginal bleeding was noted. Rapid resuscitation with fluid boluses and initiation of blood transfusion was conducted. Patient regained consciousness and was verbal. Discussed the possibility of performing a hysterectomy as a life saving measure to stop vaginal bleeding. Patient became tearful but understood. At that point, I received a phone call from Dr. Denny LevySchick (Interventional radiology) who reported that the patient is a candidate for embolization if stable. Patient desired to move forward with the embolization but understands that a hysterectomy is still a possibility if the embolization procedure fails. The patient was [facility]  while awaiting the radiology team. She received 3 L IV fluid and 3 units pRBC. Her post transfusion CBC was 7.2, 5 minutes following completion of the transfusion.  Her uterus remained firm. Blood clots were evacuated from the vaginal vault.

## 2015-08-31 NOTE — Progress Notes (Signed)
Dr. Jolayne Pantheronstant and germeroth at bedside, 1 unit prbc infusing at present, O2 remains at 10 liters nonrebreather.
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
## 2015-08-31 NOTE — MAU Note (Signed)
MAU provider called to bs for assessment after patient becoming unresponsive. Dr. Jolayne Pantheronstant called to bs for assessment. [facility]. Attempting to start a second PIV. Bleeding assessed by Dr Jolayne Pantheronstant upon her arrival; several clots manually removed from uterus.  Lab work collected and send to lab.

## 2015-08-31 NOTE — Progress Notes (Signed)
245cc blood loss measured

## 2015-08-31 NOTE — Sedation Documentation (Signed)
Report given to [facility], rn

## 2015-08-31 NOTE — Progress Notes (Signed)
Pt transferred to 172 via stretcher from MAU

## 2015-08-31 NOTE — Progress Notes (Addendum)
2nd unit PRBC given/verified by Stoney BangNatalie Deal, RN and CRNA Lanora ManisElizabeth

## 2015-08-31 NOTE — Progress Notes (Addendum)
DR. Jolayne Pantheronstant and Geramoth called to bedside to assess pt, , second IV site placed in rt hand at 0540, rapidly infusing one unit blood and LR, large amount vaginal bleeding noted(245cc). Pacer applied at 0551,rapid response arrived at 0551. At 0553 POC discussed with pt by Dr. Jolayne Pantheronstant. Stoney BangNatalie Deal, Consulting civil engineerCharge RN, Fransisco BeauKristy Lashley, RRRN, CRNA-E.Speight at bedside. Sternal rub, Fransisco BeauKristy Lashley, and Dorene Grebeatalie Deal to illicit responsiveness. Pt eyes open, but pt not responding to verbal stimuli.

## 2015-08-31 NOTE — Progress Notes (Signed)
Pt transferred via EMS[facility]

## 2015-08-31 NOTE — MAU Note (Signed)
Received patient from EMS after bleeding episode at home. Delivered by C/S on 08/11/15 after "uterine rupture." Last week she was here with bleeding and received 3 units of blood.  Woke up to feed the baby and started bleeding heavily again.

## 2015-08-31 NOTE — ED Notes (Signed)
IR at beside to get patient for procedure.

## 2015-08-31 NOTE — Progress Notes (Signed)
Patient ID: Stacy Moss, female   DOB: 07/28/1991, 24 y.o.   MRN: [id number]   Subjective: Interval History: Patient now is s/p IR embolization of right uterine artery. No further bleeding noted.  She is now s/p 4 u PRBC's.   Objective: Vital signs in last 24 hours: Temp:  [96.9 F (36.1 C)-98.4 F (36.9 C)] 98.2 F (36.8 C) (11/09 1033) Pulse Rate:  [64-206] 95 (11/09 1033) Resp:  [10-55] 16 (11/09 1033) BP: (36-137)/(19-115) 118/58 mmHg (11/09 1033) SpO2:  [99 %-100 %] 100 % (11/09 1033) Weight:  [219 lb 14.4 oz (99.746 kg)] 219 lb 14.4 oz (99.746 kg) (11/09 1033)  Intake/Output from previous day: 11/08 0701 - 11/09 0700 In: 2700 [I.V.:250] Out: -  Intake/Output this shift: Total I/O In: 750 [I.V.:250; Blood:500] Out: 150 [Blood:150]  General appearance: alert, cooperative, appears stated age and pale Lungs: normal effort Abdomen: soft, non-tender; bowel sounds normal; no masses,  no organomegaly and incision is well healed Pelvic: external genitalia normal and minimal bleeding noted Skin: Skin color, texture, turgor normal. No rashes or lesions or pale Neurologic: Grossly normal  Results for orders placed or performed during the hospital encounter of 08/31/15 (from the past 24 hour(s))  CBC     Status: Abnormal   Collection Time: 08/31/15  3:00 AM  Result Value Ref Range   WBC 13.1 (H) 4.0 - 10.5 K/uL   RBC 2.62 (L) 3.87 - 5.11 MIL/uL   Hemoglobin 7.4 (L) 12.0 - 15.0 g/dL   HCT 13.022.9 (L) 86.536.0 - 78.446.0 %   MCV 87.4 78.0 - 100.0 fL   MCH 28.2 26.0 - 34.0 pg   MCHC 32.3 30.0 - 36.0 g/dL   RDW 69.613.8 29.511.5 - 28.415.5 %   Platelets 473 (H) 150 - 400 K/uL  [facility] and [facility]     Status: None (Preliminary result)   Collection Time: 08/31/15  3:00 AM  Result Value Ref Range   ABO/RH(D) AB POS    Antibody Screen NEG    Sample Expiration 09/03/2015    Unit Number [id number]    Blood Component Type RED CELLS,LR    Unit division 00    Status of  Unit ISSUED    Transfusion Status OK TO TRANSFUSE    Crossmatch Result Compatible    Unit Number [id number]    Blood Component Type RED CELLS,LR    Unit division 00    Status of Unit ISSUED    Transfusion Status OK TO TRANSFUSE    Crossmatch Result Compatible    Unit Number [id number]    Blood Component Type RED CELLS,LR    Unit division 00    Status of Unit ISSUED    Transfusion Status OK TO TRANSFUSE    Crossmatch Result Compatible    Unit Number [id number]    Blood Component Type RED CELLS,LR    Unit division 00    Status of Unit ISSUED    Transfusion Status OK TO TRANSFUSE    Crossmatch Result Compatible    Unit Number [id number]    Blood Component Type RED CELLS,LR    Unit division 00    Status of Unit ISSUED    Transfusion Status OK TO TRANSFUSE    Crossmatch Result Compatible    Unit Number [id number]    Blood Component Type RED CELLS,LR    Unit division 00    Status of Unit ISSUED    Transfusion Status OK TO TRANSFUSE    Crossmatch Result Compatible  Unit Number [id number]    Blood Component Type RED CELLS,LR    Unit division 00    Status of Unit ISSUED    Transfusion Status OK TO TRANSFUSE    Crossmatch Result Compatible    Unit Number [id number]    Blood Component Type RED CELLS,LR    Unit division 00    Status of Unit ALLOCATED    Transfusion Status OK TO TRANSFUSE    Crossmatch Result Compatible    Unit Number [id number]    Blood Component Type RED CELLS,LR    Unit division 00    Status of Unit ALLOCATED    Transfusion Status OK TO TRANSFUSE    Crossmatch Result Compatible   Prepare RBC     Status: None   Collection Time: 08/31/15  4:00 AM  Result Value Ref Range   Order Confirmation ORDER PROCESSED BY BLOOD BANK   CBC     Status: Abnormal   Collection Time: 08/31/15  6:25 AM  Result Value Ref Range   WBC 13.2 (H) 4.0 - 10.5 K/uL   RBC 2.46 (L) 3.87 - 5.11 MIL/uL   Hemoglobin 7.2 (L) 12.0 - 15.0 g/dL   HCT 52.8 (L)  41.3 - 46.0 %   MCV 88.6 78.0 - 100.0 fL   MCH 29.3 26.0 - 34.0 pg   MCHC 33.0 30.0 - 36.0 g/dL   RDW 24.4 01.0 - 27.2 %   Platelets 220 150 - 400 K/uL  Prepare RBC     Status: None   Collection Time: 08/31/15  8:03 AM  Result Value Ref Range   Order Confirmation ORDER PROCESSED BY BLOOD BANK      Scheduled Meds: . sodium chloride   Intravenous Once  . sodium chloride   Intravenous Once  . fentaNYL      . lidocaine      . midazolam      . prenatal multivitamin  1 tablet Oral Q1200   Continuous Infusions: . sodium chloride 125 mL/hr at 08/31/15 0330   PRN Meds:fentaNYL, HYDROmorphone (DILAUDID) injection, iohexol, midazolam  Assessment/Plan: Principal Problem:   Delayed postpartum hemorrhage Active Problems:   S/P emergency cesarean section  Bleeding is stable for now  -continue to monitor - would recommend hysterectomy for any further bleeding.  -check CBC, coags and replace as necessary  -check UDS Tobacco use  -nicotine patch   LOS: 0 days   Reva Bores, MD 08/31/2015 10:43 AM

## 2015-08-31 NOTE — Progress Notes (Signed)
Lab at bedside, labs drawn. 

## 2015-08-31 NOTE — ED Provider Notes (Signed)
CSN: [id number]     Arrival date & time 08/31/15  0251 History   First MD Initiated Contact with Patient 08/31/15 0703     Chief Complaint  Patient presents with  . Vaginal Bleeding     (Consider location/radiation/quality/duration/timing/severity/associated sxs/prior Treatment) HPI  A LEVEL 5 CAVEAT PERTAINS DUE TO URGENT NEED FOR INTERVENTION Pt presenting due to vaginal bleeding.  She was transferred from [facility] hospital to have IR emolization to stop bleeding.  Per prior chart review she had copious vaginal bleeding and was hypotensive and unconscious.  She was resuscitated with IV fluids and 3 Units PRBC.  Pt became more stable and IR requested she be transferred to the ED while awaiting IR to be ready for her.  Pt c/o lower abdominal pain, states she has ongoing bleeding.  She is awake, alert.    Past Medical History  Diagnosis Date  . Hx of tonsillectomy   . S/P cesarean section 05/25/2012  . Seizures [facility])     age [age]- after fell and hit head  . Hypertension     on meds 2011  . Anxiety   . Mental disorder   . Depression   . Bipolar affective (HCC)   . ADD (attention deficit disorder)   . Polysubstance abuse 10/2014    Ectasy use  . History of marijuana use   . History of cocaine use 10/2014  . History of suicidal ideation   . Smoker   . History of VBAC   . History of gestational diabetes mellitus (GDM)     with prior pregnancy  . Hx of eye surgery 02/2015   Past Surgical History  Procedure Laterality Date  . Tonsillectomy    . Cesarean section  05/25/2012    Procedure: CESAREAN SECTION;  Surgeon: Sherron Monday, MD;  Location: [facility];  Service: Gynecology;  Laterality: N/A;  Primary cesarean section with delivery of baby girl at 0700. Apgars9/9.  Marland Kitchen Orif orbital fracture Left 02/25/2015    Procedure: OPEN REDUCTION INTERNAL FIXATION (ORIF) LEFT ORBITAL FRACTURE;  Surgeon: Melvenia Beam, MD;  Location: [facility] OR;  Service: ENT;  Laterality: Left;  . Cesarean section N/A  08/11/2015    Procedure: CESAREAN SECTION;  Surgeon: Lazaro Arms, MD;  Location: [facility] ORS;  Service: Obstetrics;  Laterality: N/A;   Family History  Problem Relation Age of Onset  . Anesthesia problems Neg Hx   . Cancer Maternal Grandmother   . Hypertension Maternal Grandmother   . Hypertension Mother    Social History  Substance Use Topics  . Smoking status: Current Every Day Smoker -- 0.25 packs/day for 10 years    Types: Cigarettes  . Smokeless tobacco: Never Used  . Alcohol Use: No     Comment: Former EtOH abuse   OB History    Gravida Para Term Preterm AB TAB SAB Ectopic Multiple Living   0 2 0 2 0 0 3     Review of Systems  ROS reviewed and all otherwise negative except for mentioned in HPI    Allergies  Review of patient's allergies indicates no known allergies.  Home Medications   Prior to Admission medications   Medication Sig Start Date End Date Taking? Authorizing Provider  amoxicillin-clavulanate (AUGMENTIN) 875-125 MG tablet Take 1 tablet by mouth 2 (two) times daily. 08/28/15  Yes Scot Jun Teague Clark, PA-C  ibuprofen (ADVIL,MOTRIN) 600 MG tablet Take 1 tablet (600 mg total) by mouth every 6 (six) hours as needed. 08/28/15  Yes Bertram DenverKaren E Teague Clark, PA-C  oxyCODONE-acetaminophen (PERCOCET/ROXICET) 5-325 MG tablet Take 1 tablet by mouth every 4 (four) hours as needed (for pain scale 4-7). 08/28/15  Yes Scot JunKaren E Teague Clark, PA-C   BP 120/84 mmHg  Pulse 86  Temp(Src) 98.4 F (36.9 C) (Oral)  Resp 16  Wt 219 lb 14.4 oz (99.746 kg)  SpO2 100%  Vitals reviewed Physical Exam  Physical Examination: General appearance - alert, tired appearing, and in no distress Mental status - alert, oriented to person, place, and time Eyes -no conjunctival injection no scleral icterus Mouth - mucous membranes moist, pharynx normal without lesions Chest - clear to auscultation, no wheezes, rales or rhonchi, symmetric air entry Heart - normal rate, regular rhythm, normal  S1, S2, no murmurs, rubs, clicks or gallops Abdomen - soft, nontender, nondistended, no masses or organomegaly Neurological - alert, oriented, normal speech Extremities - peripheral pulses normal, no pedal edema, no clubbing or cyanosis Skin - normal coloration and turgor, no rashes  ED Course  Procedures (including critical care time) Labs Review Labs Reviewed  CBC - Abnormal; Notable for the following:    WBC 13.1 (*)    RBC 2.62 (*)    Hemoglobin 7.4 (*)    HCT 22.9 (*)    Platelets 473 (*)    All other components within normal limits  CBC - Abnormal; Notable for the following:    WBC 13.2 (*)    RBC 2.46 (*)    Hemoglobin 7.2 (*)    HCT 21.8 (*)    All other components within normal limits  CBC - Abnormal; Notable for the following:    WBC 17.9 (*)    RBC 2.92 (*)    Hemoglobin 8.6 (*)    HCT 25.3 (*)    All other components within normal limits  PROTIME-INR - Abnormal; Notable for the following:    Prothrombin Time 15.7 (*)    All other components within normal limits  URINE RAPID DRUG SCREEN, HOSP PERFORMED - Abnormal; Notable for the following:    Opiates POSITIVE (*)    Benzodiazepines POSITIVE (*)    Tetrahydrocannabinol POSITIVE (*)    All other components within normal limits  CBC - Abnormal; Notable for the following:    WBC 16.1 (*)    RBC 2.61 (*)    Hemoglobin 7.8 (*)    HCT 22.5 (*)    All other components within normal limits  CBC - Abnormal; Notable for the following:    WBC 14.3 (*)    RBC 2.23 (*)    Hemoglobin 6.6 (*)    HCT 19.1 (*)    All other components within normal limits  URINALYSIS W MICROSCOPIC - Abnormal; Notable for the following:    Hgb urine dipstick LARGE (*)    Leukocytes, UA MODERATE (*)    All other components within normal limits  CBC - Abnormal; Notable for the following:    WBC 11.7 (*)    RBC 2.05 (*)    Hemoglobin 6.1 (*)    HCT 17.6 (*)    All other components within normal limits  APTT  FIBRINOGEN  TYPE AND  SCREEN  PREPARE RBC (CROSSMATCH)  PREPARE RBC (CROSSMATCH)  PREPARE RBC (CROSSMATCH)  PREPARE RBC (CROSSMATCH)    Imaging Review Ct Head Wo Contrast  09/01/2015  CLINICAL DATA:  Right arm numbness postpartum.  Headache. EXAM: CT HEAD WITHOUT CONTRAST TECHNIQUE: Contiguous axial images were obtained from the base of the skull through the  vertex without intravenous contrast. COMPARISON:  Feb 24, 2015 FINDINGS: The ventricles are normal in size and configuration. Mild prominence of the cisterna magna is a stable anatomic variant. There is no intracranial mass, hemorrhage, extra-axial fluid collection, or midline shift. The gray-white compartments are normal. No acute infarct evident. The bony calvarium appears intact. The visualized mastoid air cells are clear. IMPRESSION: No intracranial mass, hemorrhage, or focal gray - white compartment lesions/acute appearing infarct. Electronically Signed   By: Bretta Bang III M.D.   On: 09/01/2015 09:29   I have personally reviewed and evaluated these images and lab results as part of my medical decision-making.   EKG Interpretation None      MDM   Final diagnoses:  Delayed postpartum hemorrhage    Pt presenting with c/o vaginal bleeding.  She was initially seen at women's and transferred to the ED to be seen by IR for embolization.  Pt was seen by IR in the ED and transferred to their care- plan for admission back to [facility] hospital after the procedure.      Jerelyn Scott, MD 09/02/15 [phone]

## 2015-08-31 NOTE — MAU Note (Signed)
[facility] patient became unresponsive off and on. Large bleeding noted on underpad with clots. Dr. Jolayne Pantheronstant called to bs, O2 applied. More clots manually removed by Dr. Jolayne Pantheronstant and underpad changed again. Orders to transfer to 172 for bakri placement and blood transfusion. NT sent to blood bank to retrieve blood. Transferred pt to 172 with Konrad SahaBrianne Jones, AC.

## 2015-08-31 NOTE — Sedation Documentation (Signed)
Family updated as to patient's status.

## 2015-08-31 NOTE — MAU Provider Note (Signed)
History     CSN: [id number]  Arrival date and time: 08/31/15 [id number]   First Provider Initiated Contact with Patient 08/31/15 [phone]      Chief Complaint  Patient presents with  . Vaginal Bleeding   HPI [name] is a 24 y.o. [id number] who delivered by emergency repeat C/S due to uterine rupture on 08/11/15. [facility] with diagnosis of delayed PP hemorrhage and given 4 units of blood products. She states that tonight she woke up in a puddle of blood. She is having lower abdominal pain associated with the bleeding. She is feeling faint.   OB History    Gravida Para Term Preterm AB TAB SAB Ectopic Multiple Living   5 3 3  0 2 0 2 0 0 3      Past Medical History  Diagnosis Date  . Hx of tonsillectomy   . S/P cesarean section 05/25/2012  . Seizures [facility](HCC)     age [age]- after fell and hit head  . Hypertension     on meds 2011  . Anxiety   . Mental disorder   . Depression   . Bipolar affective (HCC)   . ADD (attention deficit disorder)   . Polysubstance abuse 10/2014    Ectasy use  . History of marijuana use   . History of cocaine use 10/2014  . History of suicidal ideation   . Smoker   . History of VBAC   . History of gestational diabetes mellitus (GDM)     with prior pregnancy  . Hx of eye surgery 02/2015    Past Surgical History  Procedure Laterality Date  . Tonsillectomy    . Cesarean section  05/25/2012    Procedure: CESAREAN SECTION;  Surgeon: Sherron MondayJody Bovard, MD;  Location: [facility]S;  Service: Gynecology;  Laterality: N/A;  Primary cesarean section with delivery of baby girl at 0700. Apgars9/9.  Marland Kitchen. Orif orbital fracture Left 02/25/2015    Procedure: OPEN REDUCTION INTERNAL FIXATION (ORIF) LEFT ORBITAL FRACTURE;  Surgeon: Melvenia BeamMitchell Gore, MD;  [facility];  Service: ENT;  Laterality: Left;  . Cesarean section N/A 08/11/2015    Procedure: CESAREAN SECTION;  Surgeon: Lazaro ArmsLuther H Eure, MD;  Location: [facility] ORS;  Service: Obstetrics;  Laterality: N/A;     Family History  Problem Relation Age of Onset  . Anesthesia problems Neg Hx   . Cancer Maternal Grandmother   . Hypertension Maternal Grandmother   . Hypertension Mother     Social History  Substance Use Topics  . Smoking status: Current Every Day Smoker -- 0.25 packs/day for 10 years    Types: Cigarettes  . Smokeless tobacco: Never Used  . Alcohol Use: No     Comment: Former EtOH abuse    Allergies: No Known Allergies  Prescriptions prior to admission  Medication Sig Dispense Refill Last Dose  . amoxicillin-clavulanate (AUGMENTIN) 875-125 MG tablet Take 1 tablet by mouth 2 (two) times daily. 20 tablet 0 08/30/2015 at 2100  . ibuprofen (ADVIL,MOTRIN) 600 MG tablet Take 1 tablet (600 mg total) by mouth every 6 (six) hours as needed. 30 tablet 0 08/30/2015 at 2100  . oxyCODONE-acetaminophen (PERCOCET/ROXICET) 5-325 MG tablet Take 1 tablet by mouth every 4 (four) hours as needed (for pain scale 4-7). 10 tablet 0 08/30/2015 at 2100  . acetaminophen (TYLENOL) 500 MG tablet Take 1,000 mg by mouth every 6 (six) hours as needed for headache.    Past Week at Unknown time  Review of Systems  Constitutional: Negative for fever.  Gastrointestinal: Positive for abdominal pain.  Genitourinary:       + vaginal bleeding   Physical Exam   Blood pressure 126/55, pulse 98, temperature 98.4 F (36.9 C), temperature source Axillary, resp. rate 18, unknown if currently breastfeeding.  Physical Exam  Constitutional: She is oriented to person, place, and time. She appears well-developed and well-nourished.  HENT:  Head: Normocephalic.  Cardiovascular: Normal rate.   Respiratory: Effort normal.  GI:  Abdomen is firm at the uterine fundus  Genitourinary: There is bleeding (heavy bleeding with large clots ) in the vagina.  Neurological: She is alert and oriented to person, place, and time.  Skin: Skin is warm and dry. No erythema.  Psychiatric: She has a normal mood and affect.   Results  for orders placed or performed during the hospital encounter of 08/31/15 (from the past 24 hour(s))  CBC     Status: Abnormal   Collection Time: 08/31/15  3:00 AM  Result Value Ref Range   WBC 13.1 (H) 4.0 - 10.5 K/uL   RBC 2.62 (L) 3.87 - 5.11 MIL/uL   Hemoglobin 7.4 (L) 12.0 - 15.0 g/dL   HCT 16.1 (L) 09.6 - 04.5 %   MCV 87.4 78.0 - 100.0 fL   MCH 28.2 26.0 - 34.0 pg   MCHC 32.3 30.0 - 36.0 g/dL   RDW 40.9 81.1 - 91.4 %   Platelets 473 (H) 150 - 400 K/uL   US Transvaginal Non-ob  08/31/2015  CLINICAL DATA:  Postpartum bleeding. Recent emergency C-section for uterine rupture. Recurrent delayed postpartum hemorrhage. EXAM: TRANSABDOMINAL AND TRANSVAGINAL ULTRASOUND OF PELVIS TECHNIQUE: Both transabdominal and transvaginal ultrasound examinations of the pelvis were performed. Transabdominal technique was performed for global imaging of the pelvis including uterus, ovaries, adnexal regions, and pelvic cul-de-sac. It was necessary to proceed with endovaginal exam following the transabdominal exam to visualize the endometrium. COMPARISON:  07/22/2015.  Abdominal CT from 3 days ago FINDINGS: The uterus measures 13 x 4 x 7 cm, expected for recent delivery. A Cesarean section scar has an unremarkable appearance with echogenic foci likely reflecting suture and hypoechoic redundant appearance commonly encounter. There is bladder flap hematoma and pelvic side wall hematoma which appears similar to CT 3 days ago. There is a hypoechoic structure within the posterior right lower uterine segment with swirling color Doppler flow and blood pool density on previous CT. Appearance consistent with pseudoaneurysm, less likely varix based on the pattern of flow, (a varix would still be pathologic at this size). Posterior bladder wall thickening, stable from CT. These results were called by telephone at the time of interpretation on 08/31/2015 at 4:43 am to Dr. Vonzella Nipple , who verbally acknowledged these results.  IMPRESSION: 1. 24 mm pseudoaneurysm in the right posterior lower uterine segment. 2. Bladder flap and sidewall hematoma, grossly stable from abdominal CT 3 days ago. Electronically Signed   By: Marnee Spring M.D.   On: 08/31/2015 04:54   US Pelvis Complete  08/31/2015  CLINICAL DATA:  Postpartum bleeding. Recent emergency C-section for uterine rupture. Recurrent delayed postpartum hemorrhage. EXAM: TRANSABDOMINAL AND TRANSVAGINAL ULTRASOUND OF PELVIS TECHNIQUE: Both transabdominal and transvaginal ultrasound examinations of the pelvis were performed. Transabdominal technique was performed for global imaging of the pelvis including uterus, ovaries, adnexal regions, and pelvic cul-de-sac. It was necessary to proceed with endovaginal exam following the transabdominal exam to visualize the endometrium. COMPARISON:  07/22/2015.  Abdominal CT from 3 days ago FINDINGS:  The uterus measures 13 x 4 x 7 cm, expected for recent delivery. A Cesarean section scar has an unremarkable appearance with echogenic foci likely reflecting suture and hypoechoic redundant appearance commonly encounter. There is bladder flap hematoma and pelvic side wall hematoma which appears similar to CT 3 days ago. There is a hypoechoic structure within the posterior right lower uterine segment with swirling color Doppler flow and blood pool density on previous CT. Appearance consistent with pseudoaneurysm, less likely varix based on the pattern of flow, (a varix would still be pathologic at this size). Posterior bladder wall thickening, stable from CT. These results were called by telephone at the time of interpretation on 08/31/2015 at 4:43 am to Dr. Vonzella Nipple , who verbally acknowledged these results. IMPRESSION: 1. 24 mm pseudoaneurysm in the right posterior lower uterine segment. 2. Bladder flap and sidewall hematoma, grossly stable from abdominal CT 3 days ago. Electronically Signed   By: Marnee Spring M.D.   On: 08/31/2015 04:54     MAU Course  Procedures None  MDM CBC, ABO/Rh drawn Second IV access placed Dr. Jolayne Panther called to MAU for evaluation of patient 1 mg Dilaudid given for pain. Dr. Jolayne Panther to attempt manual removal of uterine clots Dr. Jolayne Panther would like bedside US and then admit patient to Women's Unit for 3 units of blood products.  Bedside US performed prior to transport to Women's Unit Discussed [place] results with Dr. Jolayne Panther. Patient has had one additional episode of heavy bleeding with clots since Dr. Jolayne Panther left MAU.  Admit to Women's Unit for blood products. Dr. Jolayne Panther will consult IR and plan for management will be finalized later today.  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

## 2015-08-31 NOTE — Progress Notes (Signed)
EMS preparing [facility]

## 2015-08-31 NOTE — Progress Notes (Signed)
Anesthesia note Called to bedside due to large volume blood loss, hypotension and decreased level of consciousness. When I arrived, a second IV was placed, defib pads were being placed, fluids were running wide open and first unit of PRBCs was being prepared. With rapid IV fluid bolus, her BP and heart rate began to decreased. Her level of consciousness improved as PRBCs infused. Discussed plan with Dr. Ladean Rayaonstance. Plan [facility] stabilize and [facility]. She was transfused two additional units of PRBCs. BP stabilized. CBC sent and [facility]  [facility].

## 2015-09-01 ENCOUNTER — Inpatient Hospital Stay (HOSPITAL_COMMUNITY): Payer: Medicaid Other

## 2015-09-01 ENCOUNTER — Telehealth: Payer: Self-pay | Admitting: Obstetrics and Gynecology

## 2015-09-01 DIAGNOSIS — Z8632 Personal history of gestational diabetes: Secondary | ICD-10-CM

## 2015-09-01 DIAGNOSIS — R4701 Aphasia: Secondary | ICD-10-CM

## 2015-09-01 DIAGNOSIS — R200 Anesthesia of skin: Secondary | ICD-10-CM

## 2015-09-01 DIAGNOSIS — O9943 Diseases of the circulatory system complicating the puerperium: Secondary | ICD-10-CM

## 2015-09-01 DIAGNOSIS — R300 Dysuria: Secondary | ICD-10-CM

## 2015-09-01 DIAGNOSIS — O1092 Unspecified pre-existing hypertension complicating childbirth: Secondary | ICD-10-CM

## 2015-09-01 DIAGNOSIS — O99335 Smoking (tobacco) complicating the puerperium: Secondary | ICD-10-CM

## 2015-09-01 DIAGNOSIS — D62 Acute posthemorrhagic anemia: Secondary | ICD-10-CM

## 2015-09-01 DIAGNOSIS — I728 Aneurysm of other specified arteries: Secondary | ICD-10-CM

## 2015-09-01 DIAGNOSIS — O9089 Other complications of the puerperium, not elsewhere classified: Secondary | ICD-10-CM

## 2015-09-01 LAB — URINALYSIS W MICROSCOPIC (NOT AT ARMC)
Bilirubin Urine: NEGATIVE
Glucose, UA: NEGATIVE mg/dL
KETONES UR: NEGATIVE mg/dL
Nitrite: NEGATIVE
PH: 6 (ref 5.0–8.0)
Protein, ur: NEGATIVE mg/dL
SPECIFIC GRAVITY, URINE: 1.01 (ref 1.005–1.030)
Urobilinogen, UA: 0.2 mg/dL (ref 0.0–1.0)

## 2015-09-01 LAB — PREPARE RBC (CROSSMATCH)

## 2015-09-01 LAB — CBC
HCT: 19.1 % — ABNORMAL LOW (ref 36.0–46.0)
Hemoglobin: 6.6 g/dL — CL (ref 12.0–15.0)
MCH: 29.6 pg (ref 26.0–34.0)
MCHC: 34.6 g/dL (ref 30.0–36.0)
MCV: 85.7 fL (ref 78.0–100.0)
PLATELETS: 254 10*3/uL (ref 150–400)
RBC: 2.23 MIL/uL — AB (ref 3.87–5.11)
RDW: 14.8 % (ref 11.5–15.5)
WBC: 14.3 10*3/uL — ABNORMAL HIGH (ref 4.0–10.5)

## 2015-09-01 MED ORDER — SODIUM CHLORIDE 0.9 % IV SOLN: Freq: Once | INTRAVENOUS | Status: DC

## 2015-09-01 MED ORDER — FUROSEMIDE 10 MG/ML IJ SOLN
20.0000 mg | Freq: Once | INTRAMUSCULAR | Status: AC
  Administered 2015-09-01: 20 mg via INTRAVENOUS
  Filled 2015-09-01: qty 2

## 2015-09-01 MED ORDER — PHENAZOPYRIDINE HCL 100 MG PO TABS
200.0000 mg | ORAL_TABLET | Freq: Three times a day (TID) | ORAL | Status: DC | PRN
  Administered 2015-09-01: 200 mg via ORAL
  Filled 2015-09-01: qty 1
  Filled 2015-09-01: qty 2

## 2015-09-01 MED ORDER — DOCUSATE SODIUM 100 MG PO CAPS
200.0000 mg | ORAL_CAPSULE | Freq: Two times a day (BID) | ORAL | Status: DC
  Administered 2015-09-01 – 2015-09-04 (×7): 200 mg via ORAL
  Filled 2015-09-01 (×7): qty 2

## 2015-09-01 MED ORDER — PHENAZOPYRIDINE HCL 200 MG PO TABS: 200.0000 mg | ORAL_TABLET | Freq: Three times a day (TID) | ORAL | Status: DC

## 2015-09-01 MED ORDER — OXYCODONE-ACETAMINOPHEN 5-325 MG PO TABS
1.0000 | ORAL_TABLET | ORAL | Status: DC | PRN
  Administered 2015-09-01 – 2015-09-03 (×7): 2 via ORAL
  Administered 2015-09-03 (×2): 1 via ORAL
  Administered 2015-09-03 – 2015-09-04 (×2): 2 via ORAL
  Filled 2015-09-01: qty 2
  Filled 2015-09-01: qty 1
  Filled 2015-09-01 (×8): qty 2
  Filled 2015-09-01: qty 1
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
## 2015-09-01 NOTE — Clinical Documentation Improvement (Signed)
OB/GYN  Abnormal Lab/Test Results:   Component      RBC Hemoglobin HCT  Latest Ref Rng      3.87 - 5.11 MIL/uL 12.0 - 15.0 g/dL 16.136.0 - 09.646.0 %  04/5/409811/06/2015     3:00 AM 2.62 (L) 7.4 (L) 22.9 (L)  08/31/2015     6:25 AM 2.46 (L) 7.2 (L) 21.8 (L)  08/31/2015     11:10 AM 2.92 (L) 8.6 (L) 25.3 (L)  08/31/2015     2:02 PM 2.61 (L) 7.8 (L) 22.5 (L)  09/01/2015      2.23 (L) 6.6 (LL) 19.1 (L)    Possible Clinical Conditions associated with below indicators  Acute blood loss anemia  Acute blood loss anemia on chronic anemia (please specify chronic anemia type if known)  Other Condition  Cannot Clinically Determine  Pt admitted with delayed postpartum hemorrhage, requiring transfusion and uterine embolization.    Please exercise your independent, professional judgment when responding. A specific answer is not anticipated or expected. Please update your documentation within the medical record to reflect your response to this query.   Thank you, Doy MinceVangela Eyleen Rawlinson, RN [phone] Clinical Documentation Specialist

## 2015-09-01 NOTE — Progress Notes (Signed)
Patient ID: Garner NashDesiree M Redfield, female   DOB: 01/29/1991, 24 y.o.   MRN: [id number] Asked to see pt, due to inability to talk, numbness on right arm and inability to squeeze her hand.  She is s/p RUA embolization yesterday. Reports this am feeling well until around 7:30 when she suddenly could not talk. She is awake and alert and responding to questions.  States she has significant headache. There is some weakness in the right hand, and she states she cannot grip. DTR's are 2+ and symmetric in both upper and lower extremeties. Strength is normal excpet grip strength. Will check emergent head CT.

## 2015-09-01 NOTE — Progress Notes (Signed)
Patient ID: [name], female   DOB: 04/17/1991, 24 y.o.   MRN: [id number]   Post partum hemorrhage Uterine artery embolization with Dr Grace IsaacWatts 11/9  Pt has done well overnight Ambulating in room Eating/slept well  Had sudden onset of aphasia this am Emergent CT Head: IMPRESSION: No intracranial mass, hemorrhage, or focal gray - white compartment lesions/acute appearing infarct.  RN states pt has returned to normal Speaking well and clear Hg 6.6 this am Transfusing 1 unit now per RN  Call IR if needs or concerns

## 2015-09-01 NOTE — Progress Notes (Signed)
IV heard beeping, RN went into room and IV had been paused by patient and she had unhooked IV and was gone out of the room. Patient was not in bathroom and did not inform any of staff she was leaving the room/unit nor ask to unhook her from the IV for any reason. Dr.Stinson called and notified, wants to be notified when she gets back to her room.

## 2015-09-01 NOTE — Progress Notes (Signed)
1 Day Post-Op Procedure(s) (LRB): Interventional radiology right uterine artery embolization  Subjective: Patient reports tolerating PO, + flatus and no problems voiding.   Scant bleeding no pain feels good ambulating extensively  Objective: I have reviewed patient's vital signs, intake and output, medications, labs and radiology results.  General: alert, cooperative and no distress GI: soft, non-tender; bowel sounds normal; no masses,  no organomegaly and incision: clean, dry and intact  Results for orders placed or performed during the hospital encounter of 08/31/15 (from the past 24 hour(s))  CBC     Status: Abnormal   Collection Time: 08/31/15 11:10 AM  Result Value Ref Range   WBC 17.9 (H) 4.0 - 10.5 K/uL   RBC 2.92 (L) 3.87 - 5.11 MIL/uL   Hemoglobin 8.6 (L) 12.0 - 15.0 g/dL   HCT 16.125.3 (L) 09.636.0 - 04.546.0 %   MCV 86.6 78.0 - 100.0 fL   MCH 29.5 26.0 - 34.0 pg   MCHC 34.0 30.0 - 36.0 g/dL   RDW 40.913.9 81.111.5 - 91.415.5 %   Platelets 226 150 - 400 K/uL  APTT     Status: None   Collection Time: 08/31/15 11:10 AM  Result Value Ref Range   aPTT 35 24 - 37 seconds  Protime-INR     Status: Abnormal   Collection Time: 08/31/15 11:10 AM  Result Value Ref Range   Prothrombin Time 15.7 (H) 11.6 - 15.2 seconds   INR 1.23 0.00 - 1.49  Fibrinogen     Status: None   Collection Time: 08/31/15 11:10 AM  Result Value Ref Range   Fibrinogen 317 204 - 475 mg/dL  Urine rapid drug screen (hosp performed)     Status: Abnormal   Collection Time: 08/31/15 12:00 PM  Result Value Ref Range   Opiates POSITIVE (A) NONE DETECTED   Cocaine NONE DETECTED NONE DETECTED   Benzodiazepines POSITIVE (A) NONE DETECTED   Amphetamines NONE DETECTED NONE DETECTED   Tetrahydrocannabinol POSITIVE (A) NONE DETECTED   Barbiturates NONE DETECTED NONE DETECTED  BLOOD TRANSFUSION REPORT - SCANNED     Status: None   Collection Time: 08/31/15  1:05 PM   Narrative   Ordered by an unspecified provider.  CBC     Status:  Abnormal   Collection Time: 08/31/15  2:02 PM  Result Value Ref Range   WBC 16.1 (H) 4.0 - 10.5 K/uL   RBC 2.61 (L) 3.87 - 5.11 MIL/uL   Hemoglobin 7.8 (L) 12.0 - 15.0 g/dL   HCT 78.222.5 (L) 95.636.0 - 21.346.0 %   MCV 86.2 78.0 - 100.0 fL   MCH 29.9 26.0 - 34.0 pg   MCHC 34.7 30.0 - 36.0 g/dL   RDW 08.614.2 57.811.5 - 46.915.5 %   Platelets 211 150 - 400 K/uL  CBC     Status: Abnormal   Collection Time: 09/01/15  7:30 AM  Result Value Ref Range   WBC 14.3 (H) 4.0 - 10.5 K/uL   RBC 2.23 (L) 3.87 - 5.11 MIL/uL   Hemoglobin 6.6 (LL) 12.0 - 15.0 g/dL   HCT 62.919.1 (L) 52.836.0 - 41.346.0 %   MCV 85.7 78.0 - 100.0 fL   MCH 29.6 26.0 - 34.0 pg   MCHC 34.6 30.0 - 36.0 g/dL   RDW 24.414.8 01.011.5 - 27.215.5 %   Platelets 254 150 - 400 K/uL   Assessment: s/p Procedure(s): EXPLORATORY LAPAROTOMY (N/A): stable  Plan: Equilibrated hemoglobin is 6.6 No ongoing bleeding Will transfuse another unit of blood and recheck in am  If there is evidence of any significant on going bleeding, pt realizes that a hysterectomy would be indicated and recommended and she agrees  LOS: 1 day    Stacy Moss H 09/01/2015, 8:04 AM

## 2015-09-02 LAB — PREPARE RBC (CROSSMATCH)

## 2015-09-02 LAB — CBC
HCT: 23.7 % — ABNORMAL LOW (ref 36.0–46.0)
HEMATOCRIT: 17.6 % — AB (ref 36.0–46.0)
Hemoglobin: 6.1 g/dL — CL (ref 12.0–15.0)
Hemoglobin: 8 g/dL — ABNORMAL LOW (ref 12.0–15.0)
MCH: 29.8 pg (ref 26.0–34.0)
MCH: 29.1 pg (ref 26.0–34.0)
MCHC: 34.7 g/dL (ref 30.0–36.0)
MCHC: 33.8 g/dL (ref 30.0–36.0)
MCV: 85.9 fL (ref 78.0–100.0)
MCV: 86.2 fL (ref 78.0–100.0)
PLATELETS: 221 10*3/uL (ref 150–400)
Platelets: 207 10*3/uL (ref 150–400)
RBC: 2.05 MIL/uL — ABNORMAL LOW (ref 3.87–5.11)
RBC: 2.75 MIL/uL — ABNORMAL LOW (ref 3.87–5.11)
RDW: 14.8 % (ref 11.5–15.5)
RDW: 14.8 % (ref 11.5–15.5)
WBC: 11.7 10*3/uL — ABNORMAL HIGH (ref 4.0–10.5)
WBC: 13.1 10*3/uL — AB (ref 4.0–10.5)

## 2015-09-02 MED ORDER — CIPROFLOXACIN HCL 500 MG PO TABS
500.0000 mg | ORAL_TABLET | Freq: Two times a day (BID) | ORAL | Status: DC
  Administered 2015-09-02 – 2015-09-04 (×5): 500 mg via ORAL
  Filled 2015-09-02 (×5): qty 1

## 2015-09-02 MED ORDER — PHENAZOPYRIDINE HCL 200 MG PO TABS
200.0000 mg | ORAL_TABLET | Freq: Three times a day (TID) | ORAL | Status: DC
  Administered 2015-09-02 – 2015-09-04 (×7): 200 mg via ORAL
  Filled 2015-09-02 (×7): qty 1

## 2015-09-02 MED ORDER — LORAZEPAM 1 MG PO TABS: 1.0000 mg | ORAL_TABLET | Freq: Four times a day (QID) | ORAL | Status: DC | PRN

## 2015-09-02 MED ORDER — SODIUM CHLORIDE 0.9 % IV SOLN
Freq: Once | INTRAVENOUS | Status: AC
  Administered 2015-09-02: 11:00:00 via INTRAVENOUS

## 2015-09-02 NOTE — Progress Notes (Signed)
  Attempted to visit with Ms Stacy Moss who was lying in bed with the television on.  Her voice was soft and her affect guarded.  She appeared depressed as evidenced by minimal eye contact, soft tone of voice, and tearful eyes. Pt stated that she didn't feel like talking today and shared that she was getting more blood.  Yesterday, pt was hopeful that she would be discharged today and was discouraged to be receiving more blood.  Pt stated that she just wanted to be alone and asked how long chaplain would be present in the hospital today.  Reminded pt that we have a chaplain on call at all times.    Reported change in affect to pt's nurse and reminded her that yesterday patient had expressed feelings of hopelessness and difficulty wanting to go on. (Referral was also made to Social Worker Loleta BooksSarah Moss)   Please page as further needs arise.  Stacy ShapeAmanda M. Carley Hammedavee Moss, M.Div. [facility] Chaplain Pager [phone] Office [phone]   09/02/15 1200  Clinical Encounter Type  Visited With Patient  Visit Type Follow-up;Psychological support;Spiritual support  Consult/Referral To Chaplain;Nurse;Social work  Spiritual Encounters  Spiritual Needs Emotional;Grief support  Stress Factors  Patient Stress Factors Health changes;Loss

## 2015-09-02 NOTE — Progress Notes (Signed)
Faculty Practice OB/GYN Attending Note  Called to talk to patient who is now insisting that she wants a hysterectomy now in order to avoid blood transfusion, further blood loss or pain.  Talked to patient in detail; explained she underwent [place] and has no evidence of ongoing bleeding for now, hence there is no current indication for hysterectomy.  Hysterectomy is major surgery that will result in further blood loss, require more transfusions and lead to more pain in the next few weeks.  Patient feels that we "are not doing anything to help her".  She was reminded that she had multiple imaging studies and transfusions, and that she declined hysterectomy this admission leading to [place]AE which seems to be working for now.  She was assured that if there was any evidence of ongoing bleeding, a hysterectomy will be performed.  For now, transfusion is indicated to replace the blood she has already lost.  Patient seemed okay with this explanation but still seemed very frustrated.  She was told to ask her RN for pain medication as needed.  She reported dysuria, UA done yesterday was notable for moderate leukocytes and large blood.  Patient reports she is really symptomatic. She is not breastfeeding, Ciprofloxacin ordered with Pyridium, will monitor her response.  Will continue close observation.   Stacy CollinsUGONNA  Brittnie Lewey, MD, FACOG Attending Obstetrician & Gynecologist Faculty Practice, [facility]
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
## 2015-09-02 NOTE — Progress Notes (Signed)
Critical hemoglobin level of 6.1 reported to Dr Adrian BlackwaterStinson who is on the floor at the time of the call from the lab.

## 2015-09-02 NOTE — Progress Notes (Signed)
2 Days Post-Op Procedure(s) (LRB): Interventional radiology right uterine artery embolization for acute blood loss anemia secondary to postpartum hemorrhage requiring multiple transfusions  Subjective: Patient had episode of R arm numbness yesterday and inability to talk, emergent head CT was negative, symptoms resolved.  This morning, Hgb returned at 6.1, two units of additional pRBCs were ordered to be transfused.  Patient is awaiting transfusion currently, feels very tired and depressed about the whole situation.   She is tolerating PO, + flatus and no problems voiding.   No pain.  Scant bleeding.   Objective: I have reviewed patient's vital signs, intake and output, medications, labs and radiology results. Filed Vitals:   09/01/15 1656 09/01/15 2259 09/02/15 0747 09/02/15 0748  BP: 143/61 130/61  130/60  Pulse: 113 120 101 103  Temp: 98.2 F (36.8 C) 98.2 F (36.8 C)  99.1 F (37.3 C)  TempSrc: Oral Oral  Oral  Resp: $Remo'18 18  15  'khbbC$ Weight:      SpO2: 100%      11/10 0701 - 11/11 0700 In: 1308.8 [P.O.:360; I.V.:522.9; Blood:425.8] Out: 2975 [Urine:2975]  General appearance: alert, cooperative, appears stated age and pale Lungs: normal effort Abdomen: soft, non-tender; bowel sounds normal; no masses, no organomegaly and incision is well healed Pelvic: external genitalia normal and minimal bleeding noted Skin: Skin color, texture, turgor normal. No rashes or lesions or pale Neurologic: Grossly normal  CBC Latest Ref Rng 09/02/2015 09/01/2015 08/31/2015  WBC 4.0 - 10.5 K/uL 11.7(H) 14.3(H) 16.1(H)  Hemoglobin 12.0 - 15.0 g/dL 6.1(LL) 6.6(LL) 7.8(L)  Hematocrit 36.0 - 46.0 % 17.6(L) 19.1(L) 22.5(L)  Platelets 150 - 400 K/uL 207 254 211      Results for orders placed or performed during the hospital encounter of 08/31/15 (from the past 24 hour(s))  Urinalysis with microscopic     Status: Abnormal   Collection Time: 09/01/15  5:00 PM  Result Value Ref Range   Color, Urine YELLOW  YELLOW   APPearance CLEAR CLEAR   Specific Gravity, Urine 1.010 1.005 - 1.030   pH 6.0 5.0 - 8.0   Glucose, UA NEGATIVE NEGATIVE mg/dL   Hgb urine dipstick LARGE (A) NEGATIVE   Bilirubin Urine NEGATIVE NEGATIVE   Ketones, ur NEGATIVE NEGATIVE mg/dL   Protein, ur NEGATIVE NEGATIVE mg/dL   Urobilinogen, UA 0.2 0.0 - 1.0 mg/dL   Nitrite NEGATIVE NEGATIVE   Leukocytes, UA MODERATE (A) NEGATIVE   WBC, UA 0-2 <3 WBC/hpf   RBC / HPF 3-6 <3 RBC/hpf   Squamous Epithelial / LPF RARE RARE  CBC     Status: Abnormal   Collection Time: 09/02/15  6:50 AM  Result Value Ref Range   WBC 11.7 (H) 4.0 - 10.5 K/uL   RBC 2.05 (L) 3.87 - 5.11 MIL/uL   Hemoglobin 6.1 (LL) 12.0 - 15.0 g/dL   HCT 17.6 (L) 36.0 - 46.0 %   MCV 85.9 78.0 - 100.0 fL   MCH 29.8 26.0 - 34.0 pg   MCHC 34.7 30.0 - 36.0 g/dL   RDW 14.8 11.5 - 15.5 %   Platelets 207 150 - 400 K/uL  Prepare RBC     Status: None   Collection Time: 09/02/15  8:00 AM  Result Value Ref Range   Order Confirmation ORDER PROCESSED BY BLOOD BANK    09/01/2015  CT HEAD WITHOUT CONTRAST CLINICAL DATA:  Right arm numbness postpartum.  Headache.TECHNIQUE: Contiguous axial images were obtained from the base of the skull through the vertex without intravenous contrast.  COMPARISON:  Feb 24, 2015 FINDINGS: The ventricles are normal in size and configuration. Mild prominence of the cisterna magna is a stable anatomic variant. There is no intracranial mass, hemorrhage, extra-axial fluid collection, or midline shift. The gray-white compartments are normal. No acute infarct evident. The bony calvarium appears intact. The visualized mastoid air cells are clear. IMPRESSION: No intracranial mass, hemorrhage, or focal gray - white compartment lesions/acute appearing infarct. Electronically Signed   By: Lowella Grip III M.D.   On: 09/01/2015 09:29   08/31/2015 1. ULTRASOUND GUIDANCE FOR ARTERIAL ACCESS. 2. PELVIC ARTERIOGRAM 3. SELECTIVE BILATERAL INTERNAL ILIAC  ARTERIOGRAMS (3rd ORDER) 4. SELECTIVE RIGHT UTERINE ARTERIOGRAM AND PERCUTANEOUS PARTICLE EMBOLIZATION  INDICATION: Postpartum hemorrhage with pelvic ultrasound demonstrating an approximately 2.4 cm pseudoaneurysm within the right posterior lower uterine segment. Please perform pelvic arteriogram and potential percutaneous embolization.   (3rd ORDER) MEDICATIONS: None ANESTHESIA/SEDATION: Fentanyl 25 mcg IV; Versed 0.5 mg IV Sedation time 75 minutes CONTRAST:  185mL OMNIPAQUE IOHEXOL 300 MG/ML  SOLN FLUOROSCOPY TIME:  17 minutes 54 seconds (4503.8 mGy) COMPLICATIONS: None immediate PROCEDURE: Informed consent was obtained from the patient following explanation of the procedure, risks, benefits and alternatives. The patient understands, agrees and consents for the procedure. All questions were addressed. A time out was performed prior to the initiation of the procedure. Maximal barrier sterile technique utilized including caps, mask, sterile gowns, sterile gloves, large sterile drape, hand hygiene, and Betadine prep. The right femoral head was marked fluoroscopically. Under sterile conditions and local anesthesia, the right common femoral artery access was performed with a micropuncture needle. Under direct ultrasound guidance, the right common femoral was accessed with a micropuncture kit. An ultrasound image was saved for documentation purposes. This allowed for placement of a 5-French vascular sheath. A limited arteriogram was performed through the side arm of the sheath confirming appropriate access within the right common femoral artery. Over a Bentson wire, a pigtail Flush catheter was advanced to the level of the caudal aspect of the abdominal aorta and a pelvic arteriogram was performed. The 5-French C2 catheter was utilized to select the contralateral left internal iliac artery. Selective left internal iliac angiogram was performed. The C2 catheter was retracted and utilized to select the ipsilateral right  internal iliac artery. Selective right internal iliac angiogram was performed. The patent right uterine artery was identified. For selective catheterization, a Fathom 0.014 micro wire was utilized to advance a regular Renegade microcatheter into the right uterine artery and a selective right uterine arteriogram was performed. The distal vascular tree of the right uterine artery was embolized with approximately 1.5 vials of 700-900 micron Embospheres. Post embolization right uterine arteriogram was performed At this point, all wires, catheters and sheaths were removed from the patient. Hemostasis was achieved at the right groin access site with The patient tolerated the procedure well without immediate post procedural complication. FINDINGS: Initial pelvic arteriogram does not definitely demonstrate active extravasation from either uterine arteries. Selective left internal iliac arteriogram demonstrates a normal appearing left uterine artery without definitive area of vessel irregularity or extravasation. Selective right internal iliac arteriogram was performed demonstrating patency of the tortuous right uterine artery but without definitive area of active extravasation or contribution to the patient's known pseudoaneurysm within the right lower uterine segment. As such, a selective right uterine arteriogram was performed which in hind site likely contributed to a tiny pseudoaneurysm (representative series 9) which correlates with the finding seen on the preceding pelvic ultrasound (note, this pseudoaneurysm appears smaller than on  preceding pelvic ultrasound and was not definitively recognized at the time of catheter directed angiography). Given the findings seen on preceding pelvic ultrasound, the decision was made to perform a prophylactic particle embolization of the right uterine artery vascular territory which was subsequently accomplished with the administration of approximately 1.5 vials of 700 to 900 micron  Embospheres. (Note, 1 of the post embolization arteriogram demonstrates retrograde filling of a hypertrophied right ovarian artery - series 16). Completion arteriogram demonstrates a technically adequate results with near complete stasis of the right uterine artery vascular tree (representative series 18 and 19). IMPRESSION: Technically successful right uterine artery percutaneous particle embolization for postpartum hemorrhage. Despite selective catheterization of the right uterine artery, the patient's known large (approximately 2.4 cm) pseudoaneurysm within the right lower uterine segment was suboptimally visualized, potentially indicative of interval partial thrombosis. Above findings discussed with Dr. Kennon Rounds at the time of procedure completion. Electronically Signed   By: Sandi Mariscal M.D.   On: 08/31/2015 10:23  08/31/2015  TRANSABDOMINAL AND TRANSVAGINAL ULTRASOUND OF PELVIS CLINICAL DATA:  Postpartum bleeding. Recent emergency C-section for uterine rupture. Recurrent delayed postpartum hemorrhage. TECHNIQUE: Both transabdominal and transvaginal ultrasound examinations of the pelvis were performed. Transabdominal technique was performed for global imaging of the pelvis including uterus, ovaries, adnexal regions, and pelvic cul-de-sac. It was necessary to proceed with endovaginal exam following the transabdominal exam to visualize the endometrium. COMPARISON:  07/22/2015.  Abdominal CT from 3 days ago FINDINGS: The uterus measures 13 x 4 x 7 cm, expected for recent delivery. A Cesarean section scar has an unremarkable appearance with echogenic foci likely reflecting suture and hypoechoic redundant appearance commonly encounter. There is bladder flap hematoma and pelvic side wall hematoma which appears similar to CT 3 days ago. There is a hypoechoic structure within the posterior right lower uterine segment with swirling color Doppler flow and blood pool density on previous CT. Appearance consistent with  pseudoaneurysm, less likely varix based on the pattern of flow, (a varix would still be pathologic at this size). Posterior bladder wall thickening, stable from CT. These results were called by telephone at the time of interpretation on 08/31/2015 at 4:43 am to Dr. Kerry Hough , who verbally acknowledged these results. IMPRESSION: 1. 24 mm pseudoaneurysm in the right posterior lower uterine segment. 2. Bladder flap and sidewall hematoma, grossly stable from abdominal CT 3 days ago. Electronically Signed   By: Monte Fantasia M.D.   On: 08/31/2015 04:54   08/28/2015  CT ANGIOGRAPHY CHEST, ABDOMEN AND PELVIS CLINICAL DATA:  Dysuria and fever for 2 days. Leukocytosis. Cesarean section on 08/11/2015.    TECHNIQUE: Multidetector CT imaging through the chest, abdomen and pelvis was performed using the standard protocol during bolus administration of intravenous contrast. Multiplanar reconstructed images and MIPs were obtained and reviewed to evaluate the vascular anatomy. CONTRAST:  178mL OMNIPAQUE IOHEXOL 350 MG/ML SOLN COMPARISON:  None. FINDINGS: CTA CHEST FINDINGS The thoracic aorta is normal in caliber and intact. There is no dissection. The pulmonary arteries are well opacified. There is no pulmonary embolism. Review of the MIP images confirms the above findings. The lungs are clear except for minimal atelectatic appearing posterior base opacities. Central airways are patent. There is no adenopathy in the mediastinum or hila. There are no pleural effusions. CTA ABDOMEN AND PELVIS FINDINGS The abdominal aorta is normal in caliber and intact. Review of the MIP images confirms the above findings. There are normal appearances of the liver, gallbladder, pancreas, spleen, adrenals and kidneys. Bowel is  unremarkable. The postpartum uterus is grossly unremarkable. The Cesarean section scar is visible at the anterior surface of the lower uterine segment. There is a small amount of fluid around the uterus but there is no  evidence of a drainable hematoma or abscess. The low transverse abdominal incision is unremarkable, with no evidence of a subcutaneous collection. IMPRESSION: 1. No acute findings in the chest except for minimal atelectatic appearing posterior base opacities. 2. Small volume fluid surrounding the postpartum uterus without drainable abscess or hematoma. Electronically Signed   By: Andreas Newport M.D.   On: 08/28/2015 06:51   Assessment: s/p Procedure(s): Interventional radiology right uterine artery embolization for acute blood loss anemia secondary to postpartum hemorrhage requiring multiple transfusions   Plan: Patient to undergo further transfusion of two units of pRBCs given Hgb of 6.1; recheck CBC in am No ongoing bleeding for now. If there is evidence of any significant ongoing bleeding, patient realizes that a hysterectomy would be indicated and recommended and she agrees Chaplain services to see patient; appreciate their input Continue close observation   LOS: 2 days    Edona Schreffler A, MD 09/02/2015, 10:10 AM

## 2015-09-03 LAB — CBC
HCT: 22.9 % — ABNORMAL LOW (ref 36.0–46.0)
HEMOGLOBIN: 7.8 g/dL — AB (ref 12.0–15.0)
MCH: 29.3 pg (ref 26.0–34.0)
MCHC: 34.1 g/dL (ref 30.0–36.0)
MCV: 86.1 fL (ref 78.0–100.0)
Platelets: 218 10*3/uL (ref 150–400)
RBC: 2.66 MIL/uL — AB (ref 3.87–5.11)
RDW: 15 % (ref 11.5–15.5)
WBC: 11.9 10*3/uL — ABNORMAL HIGH (ref 4.0–10.5)

## 2015-09-03 NOTE — Plan of Care (Signed)
Problem: Bowel/Gastric: Goal: Will not experience complications related to bowel motility Outcome: Progressing Patient had bowel movement on 09/02/15.

## 2015-09-03 NOTE — Progress Notes (Signed)
3 Days Post-Op Procedure(s) (LRB): EXPLORATORY LAPAROTOMY (N/A)  Subjective: Patient reports incisional pain, tolerating PO, + flatus, + BM and no problems voiding.   Scant bleeding  Objective: I have reviewed patient's vital signs, intake and output, medications, labs and radiology results.  General: alert, cooperative and no distress GI: soft, non-tender; bowel sounds normal; no masses,  no organomegaly and incision: clean, dry and intact  Results for orders placed or performed during the hospital encounter of 08/31/15 (from the past 24 hour(s))  Prepare RBC     Status: None   Collection Time: 09/02/15  8:00 AM  Result Value Ref Range   Order Confirmation ORDER PROCESSED BY BLOOD BANK   CBC     Status: Abnormal   Collection Time: 09/02/15  7:08 PM  Result Value Ref Range   WBC 13.1 (H) 4.0 - 10.5 K/uL   RBC 2.75 (L) 3.87 - 5.11 MIL/uL   Hemoglobin 8.0 (L) 12.0 - 15.0 g/dL   HCT 16.123.7 (L) 09.636.0 - 04.546.0 %   MCV 86.2 78.0 - 100.0 fL   MCH 29.1 26.0 - 34.0 pg   MCHC 33.8 30.0 - 36.0 g/dL   RDW 40.914.8 81.111.5 - 91.415.5 %   Platelets 221 150 - 400 K/uL  CBC     Status: Abnormal   Collection Time: 09/03/15  5:25 AM  Result Value Ref Range   WBC 11.9 (H) 4.0 - 10.5 K/uL   RBC 2.66 (L) 3.87 - 5.11 MIL/uL   Hemoglobin 7.8 (L) 12.0 - 15.0 g/dL   HCT 78.222.9 (L) 95.636.0 - 21.346.0 %   MCV 86.1 78.0 - 100.0 fL   MCH 29.3 26.0 - 34.0 pg   MCHC 34.1 30.0 - 36.0 g/dL   RDW 08.615.0 57.811.5 - 46.915.5 %   Platelets 218 150 - 400 K/uL   Assessment: 23 days post op Caesarean section due to a uterine rupture with rupture extending into the right parametrial vessels in the lower uterine segment with intermittent episodic heavy vaginal bleeding s/p Procedure(s): EXPLORATORY LAPAROTOMY (N/A): stable  Plan: Pt has received 3 units of blood post embolization  Her most recent hemoglobin is stable i have counseled and talked with the patient extensively regarding clinical course and management Will check her hemoglobin  in am if stable will discharge, if unexpected drop, will proceed with hysterectomy  Pt aware and agrees  LOS: 3 days    Tracy Gerken H 09/03/2015, 7:04 AM

## 2015-09-04 LAB — TYPE AND SCREEN
ABO/RH(D): AB POS
Antibody Screen: NEGATIVE
UNIT DIVISION: 0
UNIT DIVISION: 0
UNIT DIVISION: 0
Unit division: 0
Unit division: 0
Unit division: 0
Unit division: 0
Unit division: 0
Unit division: 0

## 2015-09-04 LAB — CBC
HCT: 24 % — ABNORMAL LOW (ref 36.0–46.0)
HEMOGLOBIN: 8.1 g/dL — AB (ref 12.0–15.0)
MCH: 29.5 pg (ref 26.0–34.0)
MCHC: 33.8 g/dL (ref 30.0–36.0)
MCV: 87.3 fL (ref 78.0–100.0)
PLATELETS: 217 10*3/uL (ref 150–400)
RBC: 2.75 MIL/uL — AB (ref 3.87–5.11)
RDW: 14.7 % (ref 11.5–15.5)
WBC: 11 10*3/uL — AB (ref 4.0–10.5)

## 2015-09-04 MED ORDER — OXYCODONE-ACETAMINOPHEN 5-325 MG PO TABS: 1.0000 | ORAL_TABLET | ORAL | Status: DC | PRN

## 2015-09-04 MED ORDER — NICOTINE 14 MG/24HR TD PT24: 14.0000 mg | MEDICATED_PATCH | Freq: Every day | TRANSDERMAL | Status: DC

## 2015-09-04 NOTE — Discharge Instructions (Signed)
Postpartum Hemorrhage °Postpartum hemorrhage is excessive blood loss after childbirth. Some blood loss is normal after delivering a baby. However, postpartum hemorrhage is a potentially serious condition.  °CAUSES  °· A loss of muscle tone in the uterus after childbirth. °· Failure to deliver all of the placenta. °· Wounds in the birth canal caused by delivery of the fetus. °· A maternal bleeding disorder that prevents blood clotting (rare). °RISK FACTORS °You are at greater risk for postpartum hemorrhage if you: °· Have a history of postpartum hemorrhage. °· Have delivered more than one baby. °· Had preeclampsia or eclampsia. °· Had problems with the placenta. °· Had complications during your labor or delivery. °· Are obese. °· Are Asian or Hispanic. °SIGNS AND SYMPTOMS  °Vaginal bleeding after delivery is normal and should be expected. Bleeding (lochia) will occur for several days after childbirth. This can be expected with normal vaginal deliveries and cesarean deliveries.  °You are bleeding too much after your delivery if you are: °· Passing large clots or pieces of tissue. This may be small pieces of placenta left after delivery.   °· Soaking more than one sanitary pad per hour for several hours.   °· Having heavy, bright-red bleeding that occurs 4 days or more after delivery.   °· Having a discharge that has a bad smell or if you begin to run an unexplained fever.   °· Having times of lightheadedness or fainting, feeling short of breath, or having your heart beat fast with very little activity.   °DIAGNOSIS  °A diagnosis is based on your symptoms and a physical exam of your perineum, vagina, cervix, and uterus. Diagnostic tests may include: °· Blood pressure and pulse. °· Blood tests. °· Blood clotting tests. °· Ultrasonography. °TREATMENT °· Treatment is based on the severity of bleeding and may include: °¨ Uterine massage. °¨ Medicines. °¨ Blood transfusions. °· Sometimes bleeding occurs if portions of the  placenta are left behind in the uterus after delivery. If this happens, often a curettage or scraping of the inside of the uterus must be done. This usually stops the bleeding. If this treatment does not stop the bleeding, surgery (hysterectomy) may have to be performed to remove the uterus. °· If bleeding is due to clotting or bleeding problems that are not related to the pregnancy, other treatments may be needed.   °HOME CARE INSTRUCTIONS  °· Limit your activity as directed by your health care provider. Your health care provider may order bed rest (getting up to the bathroom only) or may allow you to continue light activity.   °· Keep track of the number of pads you use each day and how soaked (saturated) they are. Write this number down.   °· Do not use tampons. Do not douche or have sexual intercourse until approved by your health care provider.   °· Drink enough fluids to keep your urine clear or pale yellow.   °· Get proper amounts of rest.   °· Eat foods that are rich in iron, such as spinach, red meat, and legumes.   °SEEK IMMEDIATE MEDICAL CARE IF: °· You experience severe cramps in your stomach, back, or belly (abdomen).   °· You have a fever.   °· You pass large clots or tissue. Save any tissue for your health care provider to look at.   °· Your bleeding increases. °· You become weak or lightheaded, or you pass out.   °· Your sanitary pad count per hour is increasing. °MAKE SURE YOU: °· Understand these instructions. °· Will watch your condition. °· Will get help right away if   you are not doing well or get worse. °  °This information is not intended to replace advice given to you by your health care provider. Make sure you discuss any questions you have with your health care provider. °  °Document Released: 12/29/2003 Document Revised: 10/13/2013 Document Reviewed: 03/26/2013 °Elsevier Interactive Patient Education ©2016 [employer] ° °

## 2015-09-11 ENCOUNTER — Inpatient Hospital Stay (HOSPITAL_COMMUNITY): Payer: Medicaid Other | Admitting: Anesthesiology

## 2015-09-11 ENCOUNTER — Inpatient Hospital Stay (HOSPITAL_COMMUNITY)
Admission: AD | Admit: 2015-09-11 | Discharge: 2015-09-13 | DRG: 769 | Disposition: A | Discharge status: Home / Self Care | Payer: Medicaid Other | Source: Ambulatory Visit | Attending: Obstetrics & Gynecology | Admitting: Obstetrics & Gynecology

## 2015-09-11 ENCOUNTER — Encounter (HOSPITAL_COMMUNITY)
Admission: AD | Disposition: A | Discharge status: Home / Self Care | Payer: Self-pay | Source: Ambulatory Visit | Attending: Obstetrics & Gynecology

## 2015-09-11 DIAGNOSIS — D649 Anemia, unspecified: Secondary | ICD-10-CM | POA: Diagnosis present

## 2015-09-11 DIAGNOSIS — O99345 Other mental disorders complicating the puerperium: Secondary | ICD-10-CM | POA: Diagnosis present

## 2015-09-11 DIAGNOSIS — F1290 Cannabis use, unspecified, uncomplicated: Secondary | ICD-10-CM | POA: Diagnosis present

## 2015-09-11 DIAGNOSIS — O9081 Anemia of the puerperium: Secondary | ICD-10-CM | POA: Diagnosis present

## 2015-09-11 DIAGNOSIS — F17210 Nicotine dependence, cigarettes, uncomplicated: Secondary | ICD-10-CM | POA: Diagnosis present

## 2015-09-11 DIAGNOSIS — D62 Acute posthemorrhagic anemia: Secondary | ICD-10-CM | POA: Diagnosis present

## 2015-09-11 DIAGNOSIS — Z90711 Acquired absence of uterus with remaining cervical stump: Secondary | ICD-10-CM

## 2015-09-11 DIAGNOSIS — O99335 Smoking (tobacco) complicating the puerperium: Secondary | ICD-10-CM | POA: Diagnosis present

## 2015-09-11 DIAGNOSIS — F988 Other specified behavioral and emotional disorders with onset usually occurring in childhood and adolescence: Secondary | ICD-10-CM | POA: Diagnosis present

## 2015-09-11 DIAGNOSIS — Z9889 Other specified postprocedural states: Secondary | ICD-10-CM

## 2015-09-11 HISTORY — PX: ABDOMINAL HYSTERECTOMY: SHX81

## 2015-09-11 HISTORY — DX: Acquired absence of uterus with remaining cervical stump: Z90.711

## 2015-09-11 HISTORY — PX: BILATERAL SALPINGECTOMY: SHX5743

## 2015-09-11 LAB — COMPREHENSIVE METABOLIC PANEL
ALK PHOS: 44 U/L (ref 38–126)
ALT: 10 U/L — ABNORMAL LOW (ref 14–54)
ANION GAP: 6 (ref 5–15)
AST: 12 U/L — ABNORMAL LOW (ref 15–41)
Albumin: 3.3 g/dL — ABNORMAL LOW (ref 3.5–5.0)
BUN: 12 mg/dL (ref 6–20)
CALCIUM: 8.8 mg/dL — AB (ref 8.9–10.3)
CO2: 25 mmol/L (ref 22–32)
CREATININE: 0.81 mg/dL (ref 0.44–1.00)
Chloride: 110 mmol/L (ref 101–111)
Glucose, Bld: 88 mg/dL (ref 65–99)
Potassium: 3.8 mmol/L (ref 3.5–5.1)
SODIUM: 141 mmol/L (ref 135–145)
TOTAL PROTEIN: 6.5 g/dL (ref 6.5–8.1)
Total Bilirubin: 0.5 mg/dL (ref 0.3–1.2)

## 2015-09-11 LAB — DIC (DISSEMINATED INTRAVASCULAR COAGULATION) PANEL
D DIMER QUANT: 0.4 ug{FEU}/mL (ref 0.00–0.50)
FIBRINOGEN: 315 mg/dL (ref 204–475)
PLATELETS: 406 10*3/uL — AB (ref 150–400)
PROTHROMBIN TIME: 14.4 s (ref 11.6–15.2)

## 2015-09-11 LAB — PREPARE RBC (CROSSMATCH)

## 2015-09-11 LAB — CBC
HCT: 27.1 % — ABNORMAL LOW (ref 36.0–46.0)
HEMOGLOBIN: 8.7 g/dL — AB (ref 12.0–15.0)
MCH: 29 pg (ref 26.0–34.0)
MCHC: 32.1 g/dL (ref 30.0–36.0)
MCV: 90.3 fL (ref 78.0–100.0)
Platelets: 389 10*3/uL (ref 150–400)
RBC: 3 MIL/uL — ABNORMAL LOW (ref 3.87–5.11)
RDW: 15.1 % (ref 11.5–15.5)
WBC: 9.2 10*3/uL (ref 4.0–10.5)

## 2015-09-11 LAB — DIC (DISSEMINATED INTRAVASCULAR COAGULATION)PANEL
INR: 1.1 (ref 0.00–1.49)
Smear Review: NONE SEEN
aPTT: 35 seconds (ref 24–37)

## 2015-09-11 SURGERY — HYSTERECTOMY, ABDOMINAL
Anesthesia: General

## 2015-09-11 MED ORDER — DOCUSATE SODIUM 100 MG PO CAPS
100.0000 mg | ORAL_CAPSULE | Freq: Two times a day (BID) | ORAL | Status: DC
  Administered 2015-09-11 – 2015-09-12 (×3): 100 mg via ORAL
  Filled 2015-09-11 (×4): qty 1

## 2015-09-11 MED ORDER — FENTANYL CITRATE (PF) 100 MCG/2ML IJ SOLN
INTRAMUSCULAR | Status: DC | PRN
  Administered 2015-09-11: 25 ug via INTRAVENOUS
  Administered 2015-09-11: 50 ug via INTRAVENOUS
  Administered 2015-09-11 (×2): 25 ug via INTRAVENOUS
  Administered 2015-09-11: 50 ug via INTRAVENOUS
  Administered 2015-09-11: 25 ug via INTRAVENOUS
  Administered 2015-09-11 (×2): 50 ug via INTRAVENOUS

## 2015-09-11 MED ORDER — HYDROMORPHONE 1 MG/ML IV SOLN
INTRAVENOUS | Status: DC
  Administered 2015-09-11: 1.5 mg via INTRAVENOUS
  Administered 2015-09-11: 20:00:00 via INTRAVENOUS
  Administered 2015-09-12: 1.8 mL via INTRAVENOUS
  Administered 2015-09-12: 1.8 mg via INTRAVENOUS
  Filled 2015-09-11: qty 25

## 2015-09-11 MED ORDER — MIDAZOLAM HCL 2 MG/2ML IJ SOLN
INTRAMUSCULAR | Status: DC | PRN
  Administered 2015-09-11: 2 mg via INTRAVENOUS

## 2015-09-11 MED ORDER — GLYCOPYRROLATE 0.2 MG/ML IJ SOLN
INTRAMUSCULAR | Status: DC | PRN
  Administered 2015-09-11: .8 mg via INTRAVENOUS

## 2015-09-11 MED ORDER — LACTATED RINGERS IV SOLN
INTRAVENOUS | Status: DC
  Administered 2015-09-11 – 2015-09-12 (×2): via INTRAVENOUS

## 2015-09-11 MED ORDER — ALUM & MAG HYDROXIDE-SIMETH 200-200-20 MG/5ML PO SUSP: 30.0000 mL | ORAL | Status: DC | PRN

## 2015-09-11 MED ORDER — NICOTINE 14 MG/24HR TD PT24
14.0000 mg | MEDICATED_PATCH | Freq: Every day | TRANSDERMAL | Status: DC
  Filled 2015-09-11 (×4): qty 1

## 2015-09-11 MED ORDER — PHENYLEPHRINE 40 MCG/ML (10ML) SYRINGE FOR IV PUSH (FOR BLOOD PRESSURE SUPPORT)
PREFILLED_SYRINGE | INTRAVENOUS | Status: AC
  Filled 2015-09-11: qty 10

## 2015-09-11 MED ORDER — FAMOTIDINE IN NACL 20-0.9 MG/50ML-% IV SOLN
20.0000 mg | Freq: Once | INTRAVENOUS | Status: AC
Start: 2015-09-11 — End: 2015-09-11
  Administered 2015-09-11: 20 mg via INTRAVENOUS
  Filled 2015-09-11: qty 50

## 2015-09-11 MED ORDER — OXYCODONE-ACETAMINOPHEN 5-325 MG PO TABS: 1.0000 | ORAL_TABLET | ORAL | Status: DC | PRN

## 2015-09-11 MED ORDER — LIDOCAINE HCL (CARDIAC) 20 MG/ML IV SOLN
INTRAVENOUS | Status: DC | PRN
  Administered 2015-09-11: 30 mg via INTRAVENOUS

## 2015-09-11 MED ORDER — PROPOFOL 10 MG/ML IV BOLUS
INTRAVENOUS | Status: AC
  Filled 2015-09-11: qty 20

## 2015-09-11 MED ORDER — BUPIVACAINE HCL (PF) 0.5 % IJ SOLN
INTRAMUSCULAR | Status: DC | PRN
  Administered 2015-09-11: 30 mL

## 2015-09-11 MED ORDER — ONDANSETRON HCL 4 MG/2ML IJ SOLN: 4.0000 mg | Freq: Four times a day (QID) | INTRAMUSCULAR | Status: DC | PRN

## 2015-09-11 MED ORDER — SUCCINYLCHOLINE CHLORIDE 20 MG/ML IJ SOLN
INTRAMUSCULAR | Status: DC | PRN
  Administered 2015-09-11: 100 mg via INTRAVENOUS

## 2015-09-11 MED ORDER — SIMETHICONE 80 MG PO CHEW: 80.0000 mg | CHEWABLE_TABLET | Freq: Four times a day (QID) | ORAL | Status: DC | PRN

## 2015-09-11 MED ORDER — MENTHOL 3 MG MT LOZG
1.0000 | LOZENGE | OROMUCOSAL | Status: DC | PRN
  Filled 2015-09-11: qty 9

## 2015-09-11 MED ORDER — OXYCODONE HCL 5 MG PO TABS: 5.0000 mg | ORAL_TABLET | Freq: Once | ORAL | Status: DC | PRN

## 2015-09-11 MED ORDER — ONDANSETRON HCL 4 MG/2ML IJ SOLN
INTRAMUSCULAR | Status: DC | PRN
  Administered 2015-09-11: 4 mg via INTRAVENOUS

## 2015-09-11 MED ORDER — NEOSTIGMINE METHYLSULFATE 10 MG/10ML IV SOLN
INTRAVENOUS | Status: AC
Start: 2015-09-11 — End: 2015-09-11
  Filled 2015-09-11: qty 1

## 2015-09-11 MED ORDER — LACTATED RINGERS IV SOLN
INTRAVENOUS | Status: DC | PRN
  Administered 2015-09-11 (×2): via INTRAVENOUS

## 2015-09-11 MED ORDER — HYDROMORPHONE HCL 1 MG/ML IJ SOLN
INTRAMUSCULAR | Status: AC
  Filled 2015-09-11: qty 1

## 2015-09-11 MED ORDER — SODIUM CHLORIDE 0.9 % IV SOLN
INTRAVENOUS | Status: DC
  Administered 2015-09-11 (×2): via INTRAVENOUS

## 2015-09-11 MED ORDER — CEFAZOLIN SODIUM-DEXTROSE 2-3 GM-% IV SOLR
2.0000 g | Freq: Once | INTRAVENOUS | Status: AC
  Administered 2015-09-11: 2 g via INTRAVENOUS
  Filled 2015-09-11: qty 50

## 2015-09-11 MED ORDER — FENTANYL CITRATE (PF) 100 MCG/2ML IJ SOLN
INTRAMUSCULAR | Status: AC
  Filled 2015-09-11: qty 2

## 2015-09-11 MED ORDER — NEOSTIGMINE METHYLSULFATE 10 MG/10ML IV SOLN
INTRAVENOUS | Status: DC | PRN
  Administered 2015-09-11: 5 mg via INTRAVENOUS

## 2015-09-11 MED ORDER — ONDANSETRON HCL 4 MG PO TABS: 4.0000 mg | ORAL_TABLET | Freq: Four times a day (QID) | ORAL | Status: DC | PRN

## 2015-09-11 MED ORDER — ROCURONIUM BROMIDE 100 MG/10ML IV SOLN
INTRAVENOUS | Status: DC | PRN
  Administered 2015-09-11: 5 mg via INTRAVENOUS
  Administered 2015-09-11: 20 mg via INTRAVENOUS
  Administered 2015-09-11: 10 mg via INTRAVENOUS

## 2015-09-11 MED ORDER — LACTATED RINGERS IV BOLUS (SEPSIS): 1000.0000 mL | Freq: Once | INTRAVENOUS | Status: DC

## 2015-09-11 MED ORDER — FENTANYL CITRATE (PF) 250 MCG/5ML IJ SOLN
INTRAMUSCULAR | Status: AC
  Filled 2015-09-11: qty 5

## 2015-09-11 MED ORDER — IBUPROFEN 600 MG PO TABS
600.0000 mg | ORAL_TABLET | Freq: Four times a day (QID) | ORAL | Status: DC | PRN
  Administered 2015-09-12 (×2): 600 mg via ORAL
  Filled 2015-09-11 (×2): qty 1

## 2015-09-11 MED ORDER — LIDOCAINE HCL (CARDIAC) 20 MG/ML IV SOLN
INTRAVENOUS | Status: AC
  Filled 2015-09-11: qty 5

## 2015-09-11 MED ORDER — PANTOPRAZOLE SODIUM 40 MG PO TBEC
40.0000 mg | DELAYED_RELEASE_TABLET | Freq: Every day | ORAL | Status: DC
  Administered 2015-09-12 – 2015-09-13 (×2): 40 mg via ORAL
  Filled 2015-09-11 (×2): qty 1

## 2015-09-11 MED ORDER — DEXAMETHASONE SODIUM PHOSPHATE 10 MG/ML IJ SOLN
INTRAMUSCULAR | Status: DC | PRN
  Administered 2015-09-11: 4 mg via INTRAVENOUS

## 2015-09-11 MED ORDER — HYDROMORPHONE HCL 1 MG/ML IJ SOLN
INTRAMUSCULAR | Status: AC
Start: 2015-09-11 — End: 2015-09-11
  Filled 2015-09-11: qty 1

## 2015-09-11 MED ORDER — ROCURONIUM BROMIDE 100 MG/10ML IV SOLN
INTRAVENOUS | Status: AC
  Filled 2015-09-11: qty 1

## 2015-09-11 MED ORDER — PHENYLEPHRINE 40 MCG/ML (10ML) SYRINGE FOR IV PUSH (FOR BLOOD PRESSURE SUPPORT)
PREFILLED_SYRINGE | INTRAVENOUS | Status: AC
  Filled 2015-09-11: qty 20

## 2015-09-11 MED ORDER — ACETAMINOPHEN 160 MG/5ML PO SOLN: 325.0000 mg | ORAL | Status: DC | PRN

## 2015-09-11 MED ORDER — PROPOFOL 10 MG/ML IV BOLUS
INTRAVENOUS | Status: DC | PRN
  Administered 2015-09-11: 150 mg via INTRAVENOUS

## 2015-09-11 MED ORDER — SCOPOLAMINE 1 MG/3DAYS TD PT72
MEDICATED_PATCH | TRANSDERMAL | Status: AC
  Filled 2015-09-11: qty 1

## 2015-09-11 MED ORDER — PHENYLEPHRINE HCL 10 MG/ML IJ SOLN
INTRAMUSCULAR | Status: DC | PRN
  Administered 2015-09-11: 80 ug via INTRAVENOUS
  Administered 2015-09-11 (×7): 40 ug via INTRAVENOUS

## 2015-09-11 MED ORDER — SENNOSIDES-DOCUSATE SODIUM 8.6-50 MG PO TABS: 1.0000 | ORAL_TABLET | Freq: Every evening | ORAL | Status: DC | PRN

## 2015-09-11 MED ORDER — SODIUM CHLORIDE 0.9 % IV SOLN
Freq: Once | INTRAVENOUS | Status: AC
  Administered 2015-09-11: 15:00:00 via INTRAVENOUS

## 2015-09-11 MED ORDER — SODIUM CHLORIDE 0.9 % IV BOLUS (SEPSIS)
1000.0000 mL | Freq: Once | INTRAVENOUS | Status: AC
  Administered 2015-09-11: 1000 mL via INTRAVENOUS

## 2015-09-11 MED ORDER — EPHEDRINE SULFATE 50 MG/ML IJ SOLN
INTRAMUSCULAR | Status: DC | PRN
  Administered 2015-09-11: 10 mg via INTRAVENOUS
  Administered 2015-09-11: 5 mg via INTRAVENOUS

## 2015-09-11 MED ORDER — MIDAZOLAM HCL 2 MG/2ML IJ SOLN
INTRAMUSCULAR | Status: AC
  Filled 2015-09-11: qty 2

## 2015-09-11 MED ORDER — LACTATED RINGERS IV SOLN: INTRAVENOUS | Status: DC

## 2015-09-11 MED ORDER — SUCCINYLCHOLINE CHLORIDE 20 MG/ML IJ SOLN
INTRAMUSCULAR | Status: AC
  Filled 2015-09-11: qty 1

## 2015-09-11 MED ORDER — SODIUM CHLORIDE 0.9 % IJ SOLN: 9.0000 mL | INTRAMUSCULAR | Status: DC | PRN

## 2015-09-11 MED ORDER — ACETAMINOPHEN 325 MG PO TABS: 325.0000 mg | ORAL_TABLET | ORAL | Status: DC | PRN

## 2015-09-11 MED ORDER — GLYCOPYRROLATE 0.2 MG/ML IJ SOLN
INTRAMUSCULAR | Status: AC
  Filled 2015-09-11: qty 4

## 2015-09-11 MED ORDER — ONDANSETRON HCL 4 MG/2ML IJ SOLN
INTRAMUSCULAR | Status: AC
  Filled 2015-09-11: qty 2

## 2015-09-11 MED ORDER — SCOPOLAMINE 1 MG/3DAYS TD PT72
MEDICATED_PATCH | TRANSDERMAL | Status: DC | PRN
  Administered 2015-09-11: 1 via TRANSDERMAL

## 2015-09-11 MED ORDER — BISACODYL 10 MG RE SUPP
10.0000 mg | Freq: Every day | RECTAL | Status: DC | PRN
  Administered 2015-09-13: 10 mg via RECTAL
  Filled 2015-09-11: qty 1

## 2015-09-11 MED ORDER — HYDROMORPHONE HCL 1 MG/ML IJ SOLN: 0.2000 mg | INTRAMUSCULAR | Status: DC | PRN

## 2015-09-11 MED ORDER — ZOLPIDEM TARTRATE 5 MG PO TABS
5.0000 mg | ORAL_TABLET | Freq: Every evening | ORAL | Status: DC | PRN
  Administered 2015-09-12: 5 mg via ORAL
  Filled 2015-09-11: qty 1

## 2015-09-11 MED ORDER — MAGNESIUM CITRATE PO SOLN
1.0000 | Freq: Once | ORAL | Status: DC | PRN
  Filled 2015-09-11: qty 296

## 2015-09-11 MED ORDER — EPHEDRINE 5 MG/ML INJ
INTRAVENOUS | Status: AC
  Filled 2015-09-11: qty 10

## 2015-09-11 MED ORDER — DIPHENHYDRAMINE HCL 12.5 MG/5ML PO ELIX: 12.5000 mg | ORAL_SOLUTION | Freq: Four times a day (QID) | ORAL | Status: DC | PRN

## 2015-09-11 MED ORDER — OXYCODONE HCL 5 MG/5ML PO SOLN: 5.0000 mg | Freq: Once | ORAL | Status: DC | PRN

## 2015-09-11 MED ORDER — DEXAMETHASONE SODIUM PHOSPHATE 4 MG/ML IJ SOLN
INTRAMUSCULAR | Status: AC
  Filled 2015-09-11: qty 1

## 2015-09-11 MED ORDER — DIPHENHYDRAMINE HCL 50 MG/ML IJ SOLN: 12.5000 mg | Freq: Four times a day (QID) | INTRAMUSCULAR | Status: DC | PRN

## 2015-09-11 MED ORDER — BUPIVACAINE HCL (PF) 0.5 % IJ SOLN
INTRAMUSCULAR | Status: AC
  Filled 2015-09-11: qty 30

## 2015-09-11 MED ORDER — HYDROMORPHONE HCL 1 MG/ML IJ SOLN
0.2500 mg | INTRAMUSCULAR | Status: DC | PRN
  Administered 2015-09-11: 0.5 mg via INTRAVENOUS

## 2015-09-11 MED ORDER — HYDROMORPHONE HCL 1 MG/ML IJ SOLN
INTRAMUSCULAR | Status: DC | PRN
  Administered 2015-09-11: 1 mg via INTRAVENOUS

## 2015-09-11 MED ORDER — CITRIC ACID-SODIUM CITRATE 334-500 MG/5ML PO SOLN
30.0000 mL | Freq: Once | ORAL | Status: AC
  Administered 2015-09-11: 30 mL via ORAL
  Filled 2015-09-11: qty 15

## 2015-09-11 MED ORDER — NALOXONE HCL 0.4 MG/ML IJ SOLN: 0.4000 mg | INTRAMUSCULAR | Status: DC | PRN

## 2015-09-11 SURGICAL SUPPLY — 48 items
BARRIER ADHS 3X4 INTERCEED (GAUZE/BANDAGES/DRESSINGS) IMPLANT
BENZOIN TINCTURE PRP APPL 2/3 (GAUZE/BANDAGES/DRESSINGS) IMPLANT
CANISTER SUCT 3000ML (MISCELLANEOUS) ×4 IMPLANT
CELLS DAT CNTRL 66122 CELL SVR (MISCELLANEOUS) ×2 IMPLANT
CLOSURE WOUND 1/2 X4 (GAUZE/BANDAGES/DRESSINGS) ×1
CLOTH BEACON ORANGE TIMEOUT ST (SAFETY) ×4 IMPLANT
CONT PATH 16OZ SNAP LID 3702 (MISCELLANEOUS) ×4 IMPLANT
DECANTER SPIKE VIAL GLASS SM (MISCELLANEOUS) IMPLANT
DRAPE WARM FLUID 44X44 (DRAPE) IMPLANT
DRSG OPSITE POSTOP 4X10 (GAUZE/BANDAGES/DRESSINGS) ×4 IMPLANT
DURAPREP 26ML APPLICATOR (WOUND CARE) ×4 IMPLANT
ELECT LIGASURE SHORT 9 REUSE (ELECTRODE) IMPLANT
GAUZE SPONGE 4X4 16PLY XRAY LF (GAUZE/BANDAGES/DRESSINGS) ×4 IMPLANT
GLOVE BIOGEL PI IND STRL 7.0 (GLOVE) ×6 IMPLANT
GLOVE BIOGEL PI INDICATOR 7.0 (GLOVE) ×6
GLOVE ECLIPSE 7.0 STRL STRAW (GLOVE) ×4 IMPLANT
GOWN STRL REUS W/TWL LRG LVL3 (GOWN DISPOSABLE) ×8 IMPLANT
HEMOSTAT SURGICEL 2X14 (HEMOSTASIS) ×4 IMPLANT
NEEDLE HYPO 22GX1.5 SAFETY (NEEDLE) ×4 IMPLANT
NS IRRIG 1000ML POUR BTL (IV SOLUTION) ×4 IMPLANT
PACK ABDOMINAL GYN (CUSTOM PROCEDURE TRAY) ×4 IMPLANT
PAD OB MATERNITY 4.3X12.25 (PERSONAL CARE ITEMS) ×4 IMPLANT
PENCIL SMOKE EVAC W/HOLSTER (ELECTROSURGICAL) ×4 IMPLANT
PROTECTOR NERVE ULNAR (MISCELLANEOUS) ×4 IMPLANT
RETRACTOR WND ALEXIS 25 LRG (MISCELLANEOUS) IMPLANT
RTRCTR WOUND ALEXIS 18CM MED (MISCELLANEOUS) ×4
RTRCTR WOUND ALEXIS 25CM LRG (MISCELLANEOUS)
SPONGE GAUZE 4X4 12PLY STER LF (GAUZE/BANDAGES/DRESSINGS) ×4 IMPLANT
SPONGE LAP 18X18 X RAY DECT (DISPOSABLE) ×8 IMPLANT
STAPLER VISISTAT 35W (STAPLE) IMPLANT
STRIP CLOSURE SKIN 1/2X4 (GAUZE/BANDAGES/DRESSINGS) ×3 IMPLANT
SUT PDS AB 0 CTX 36 PDP370T (SUTURE) ×4 IMPLANT
SUT PDS AB 0 CTX 60 (SUTURE) IMPLANT
SUT PLAIN 2 0 (SUTURE)
SUT PLAIN 2 0 XLH (SUTURE) ×4 IMPLANT
SUT PLAIN ABS 2-0 CT1 27XMFL (SUTURE) IMPLANT
SUT VIC AB 0 CT1 18XCR BRD8 (SUTURE) ×6 IMPLANT
SUT VIC AB 0 CT1 27 (SUTURE) ×2
SUT VIC AB 0 CT1 27XBRD ANBCTR (SUTURE) ×2 IMPLANT
SUT VIC AB 0 CT1 8-18 (SUTURE) ×6
SUT VIC AB 0 CTX 36 (SUTURE)
SUT VIC AB 0 CTX36XBRD ANBCTRL (SUTURE) IMPLANT
SUT VIC AB 4-0 KS 27 (SUTURE) IMPLANT
SUT VICRYL 0 TIES 12 18 (SUTURE) ×4 IMPLANT
SYR CONTROL 10ML LL (SYRINGE) ×4 IMPLANT
TOWEL OR 17X24 6PK STRL BLUE (TOWEL DISPOSABLE) ×8 IMPLANT
TRAY FOLEY CATH SILVER 14FR (SET/KITS/TRAYS/PACK) ×4 IMPLANT
WATER STERILE IRR 1000ML POUR (IV SOLUTION) ×4 IMPLANT

## 2015-09-11 NOTE — Anesthesia Preprocedure Evaluation (Signed)
Anesthesia Evaluation  Patient identified by MRN, date of birth, ID band Patient awake    Reviewed: Allergy & Precautions, NPO status , Patient's Chart, lab work & pertinent test results  History of Anesthesia Complications Negative for: history of anesthetic complications  Airway Mallampati: II  TM Distance: >3 FB Neck ROM: Full    Dental  (+) Teeth Intact   Pulmonary neg shortness of breath, neg sleep apnea, neg COPD, neg recent URI, Current Smoker,    Pulmonary exam normal        Cardiovascular hypertension, Normal cardiovascular exam     Neuro/Psych PSYCHIATRIC DISORDERS Anxiety Depression Bipolar Disorder negative neurological ROS     GI/Hepatic negative GI ROS, Neg liver ROS,   Endo/Other    Renal/GU negative Renal ROS     Musculoskeletal   Abdominal   Peds  Hematology  (+) anemia ,   Anesthesia Other Findings   Reproductive/Obstetrics Uterine bleeding post rupture and embolization                             Anesthesia Physical Anesthesia Plan  ASA: II and emergent  Anesthesia Plan: General   Post-op Pain Management:    Induction: Intravenous  Airway Management Planned: Oral ETT  Additional Equipment: None  Intra-op Plan:   Post-operative Plan: Extubation in OR  Informed Consent: I have reviewed the patients History and Physical, chart, labs and discussed the procedure including the risks, benefits and alternatives for the proposed anesthesia with the patient or authorized representative who has indicated his/her understanding and acceptance.   Dental advisory given  Plan Discussed with: CRNA and Surgeon  Anesthesia Plan Comments:         Anesthesia Quick Evaluation

## 2015-09-11 NOTE — Transfer of Care (Signed)
Immediate Anesthesia Transfer of Care Note  Patient: Stacy Moss  Procedure(s) Performed: Procedure(s): SUPRACERVICAL ABDONIMAL HYSTERECTOMY,  (N/A) BILATERAL SALPINGECTOMY  Patient Location: PACU  Anesthesia Type:General  Level of Consciousness: awake and alert   Airway & Oxygen Therapy: Patient Spontanous Breathing and Patient connected to nasal cannula oxygen  Post-op Assessment: Report given to RN and Post -op Vital signs reviewed and stable  Post vital signs: Reviewed and stable  Last Vitals:  Filed Vitals:   09/11/15 1454  BP: 139/71  Pulse: 101  Resp: 18    Complications: No apparent anesthesia complications

## 2015-09-11 NOTE — Op Note (Signed)
Stacy Moss PROCEDURE DATE: 09/11/2015  PREOPERATIVE DIAGNOSIS:  Recurrent delayed postpartum hemorrhage despite uterine artery embolization, multiple transfusions and other interventions POSTOPERATIVE DIAGNOSIS:  The same SURGEON:   [name], M.D. ASSISTANT: Catalina Antigua, M.D. OPERATION: Supracervical abdominal hysterectomy, Bilateral Salpingectomy ANESTHESIA:  General endotracheal.  INDICATIONS: The patient is a 24 y.o. Stacy Moss with the aforementioned diagnoses needing definitive surgical management.  Please see the preoperative note for more details. The risks of surgery were again discussed with the patient including but not limited to: bleeding which may require transfusion or reoperation; infection which may require antibiotics; injury to bowel, bladder, ureters or other surrounding organs; need for additional procedures; possibility of supracervical procedure secondary to bladder adhesions; thromboembolic phenomenon, incisional problems and other postoperative/anesthesia complications. Written informed consent was obtained.    OPERATIVE FINDINGS: Small uterus with dense adhesions to bladder involving the entire anterior surface of the uterus and the cervix.  The adhesions were painstakingly dissected sharply and bluntly to the level of upper cervix; but dense adhesions at this point prohibited further dissection.  This resulted in the procedure being supracervical hysterectomy, not a total hysterectomy.   The entire fundus and lower uterine segment, especially around the cesarean hysterotomy, was very friable and almost seemed necrotic.  When some parts of the uterus were clamped during the procedure, the tissue seemed to rip away from the clamps prior to being cut.  Normal adnexa bilaterally.  ESTIMATED BLOOD LOSS: 600 ml FLUIDS:  2600 ml of Lactated Ringers; 3 units of pRBCs URINE OUTPUT:  80 ml of clear yellow urine. SPECIMENS:  Uterus and bilateral fallopian tubes sent to  pathology COMPLICATIONS:  None immediate.   DESCRIPTION OF PROCEDURE:  The patient received intravenous antibiotics and had sequential compression devices applied to her lower extremities while in the MAU.   She was taken to the operating room and placed under general anesthesia without difficulty.The abdomen and perineum were prepped and draped in a sterile manner, and she was placed in a dorsal supine position.  A Foley catheter was inserted into the bladder and attached to constant drainage. After an adequate timeout was performed, a Pfannensteil skin incision was made over her preexisting cesarean scar. This incision was taken down to the fascia sharply with care given to maintain good hemostasis. The fascia was incised in the midline and the fascial incision was then extended bilaterally. The fascia was then dissected off the underlying rectus muscles using blunt and sharp dissection. The rectus muscles were split bluntly in the midline and the peritoneum entered bluntly without complication. This peritoneal incision was then extended superiorly and inferiorly with care given to prevent bowel or bladder injury. Attention was then turned to the pelvis.  An Alexis retractor was placed into the incision, and the bowel was packed away with moist laparotomy sponges. The uterus at this point was noted to be tightly adherent to the bladder; these adhesions were painstakingly dissected sharply and bluntly to the level of upper cervix, but dense adhesions at this point prohibited further dissection.  The uterus was also noted to be very friable; the uterus tissue seemed to crumble under the clamps placed but the sidewall, and vessel pedicles seemed strong enough to hold the clamps and subsequent ligatures.  The uteroovarian and round ligaments on each side were clamped, cut and doubly suture ligated with 0 Vicryl allowing for both adnexa to remain in place.  Of note, all sutures used in this procedure are 0 Vicryl  unless otherwise noted.  Kelly clamps were placed on the mesosalpinx of the right fallopian tube, and the fallopian tube was excised.  The pedicle was then secured with a free tie.  A similar process was carried out on the left side, allowing for bilateral salpingectomy.  The uterine arteries were then clamped, cut, and suture ligated with care given to prevent ureteral injury.  At this point, the lower uterine segment was so friable and further dissection off the bladder was done.  The uterus was then amputated across the lower uterine segment leaving the cervix intact. The specimen was sent to pathology. We were unable to recognize the cervical canal due to overall friability of the tissue, but stitches were placed to reapproximate the anterior and posterior peritoenum in the midline over the cervical stump.    The pelvis was irrigated and there was so bleeding around the dissected adhesion area.  Surgicel was placed for further hemostasis.  The ureters were inspected and noted to be peristalsing bilaterally away from the area of dissection bilaterally.  All laparotomy sponges and instruments were removed from the abdomen. The fascia was closed in a running fashion. The subcutaneous layer was reapproximated with 2-0 plain gut. The skin was closed with a 4-0 Vicryl subcuticular stitch. Sponge, lap, needle, and instrument counts were correct times two. The patient was taken to the recovery area awake, extubated and in stable condition.   Jaynie CollinsUGONNA  [name], MD, FACOG Attending Obstetrician & Gynecologist Faculty Practice, [facility] [place]

## 2015-09-11 NOTE — Anesthesia Postprocedure Evaluation (Signed)
Anesthesia Post Note  Patient: Stacy Moss  Procedure(s) Performed: Procedure(s) (LRB): SUPRACERVICAL ABDONIMAL HYSTERECTOMY,  (N/A) BILATERAL SALPINGECTOMY  Patient location during evaluation: PACU Anesthesia Type: General Level of consciousness: awake Pain management: pain level controlled Vital Signs Assessment: post-procedure vital signs reviewed and stable Respiratory status: spontaneous breathing Cardiovascular status: stable Postop Assessment: No signs of nausea or vomiting Anesthetic complications: no    Last Vitals:  Filed Vitals:   09/11/15 2129 09/11/15 2159  BP: 137/67   Pulse: 67   Temp:    Resp: 18 19    Last Pain:  Filed Vitals:   09/11/15 2200  PainSc: 4                  [name]

## 2015-09-11 NOTE — H&P (Signed)
History and Physical  Stacy Moss is a 24 y.o. [id number] who presented to [facility] for her third episode of delayed postpartum bleeding.  Patient had an emergent cesarean section for uterine rupture on 94/80/16 complicated by significant blood loss requiring transfusion of 2 units of pRBCs.  She was discharged on POD#2, but returned on POD#5 with another episode of postpartum hemorrhage. She received another 4 units of pRBCs and was discharged to home, but returned on POD#20 with another bleeding episode.  This admission on 08/31/15 (PVV#74) was complicated by anemia and associated shock that led to a code being called. Hysterectomy was recommended but patient declined, and underwent IR uterine artery embolization. She received a total of six units of pRBCs during this admission. She was monitored until 09/04/15 and had no further significant bleeding, discharge hemoglobin stabilized at 8.1.    Today, patient had another large episode of bleeding at home.  She is requesting a hysterectomy.  Reports symptoms of lightheadedness and fatigue.    Proposed surgery: Total Abdominal Hysterectomy  Past Medical History  Diagnosis Date  . Hx of tonsillectomy   . S/P cesarean section 05/25/2012  . Seizures [facility])     age [age]- after fell and hit head  . Hypertension     on meds 2011  . Anxiety   . Mental disorder   . Depression   . Bipolar affective ([place])   . ADD (attention deficit disorder)   . Polysubstance abuse 10/2014    Ectasy use  . History of marijuana use   . History of cocaine use 10/2014  . History of suicidal ideation   . Smoker   . History of VBAC   . History of gestational diabetes mellitus (GDM)     with prior pregnancy  . Hx of eye surgery 02/2015   Past Surgical History  Procedure Laterality Date  . Tonsillectomy    . Cesarean section  05/25/2012    Procedure: CESAREAN SECTION;  Surgeon: Thornell Sartorius, MD;  Location: [facility];  Service: Gynecology;  Laterality: N/A;  Primary cesarean  section with delivery of baby girl at 0700. Apgars9/9.  Marland Kitchen Orif orbital fracture Left 02/25/2015    Procedure: OPEN REDUCTION INTERNAL FIXATION (ORIF) LEFT ORBITAL FRACTURE;  Surgeon: Ruby Cola, MD;  Location: [facility];  Service: ENT;  Laterality: Left;  . Cesarean section N/A 08/11/2015    Procedure: CESAREAN SECTION;  Surgeon: Florian Buff, MD;  Location: [facility];  Service: Obstetrics;  Laterality: N/A;   OB History  Gravida Para Term Preterm AB SAB TAB Ectopic Multiple Living  $Remov'5 3 3 'kIzGzX$ 0 2 2 0 0 0 3    # Outcome Date GA Lbr Len/2nd Weight Sex Delivery Anes PTL Lv  5 Term 08/11/15 [redacted]w[redacted]d  5 lb 14.2 oz (2.67 kg) F CS-LTranv EPI  Y  4 Term 09/03/14 [redacted]w[redacted]d 24:24 / 00:06 6 lb 14.2 oz (3.124 kg) M VBAC EPI  Y  3 Term 05/25/12 [redacted]w[redacted]d  5 lb 11.2 oz (2.585 kg) F CS-LTranv EPI  Y  2 SAB 2011     SAB     1 SAB             Patient denies any other pertinent gynecologic issues.   No current facility-administered medications on file prior to encounter.   Current Outpatient Prescriptions on File Prior to Encounter  Medication Sig Dispense Refill  . amoxicillin-clavulanate (AUGMENTIN) 875-125 MG tablet Take 1 tablet by mouth 2 (two) times daily. 20 tablet 0  .  ibuprofen (ADVIL,MOTRIN) 600 MG tablet Take 1 tablet (600 mg total) by mouth every 6 (six) hours as needed. 30 tablet 0  . nicotine (NICODERM CQ - DOSED IN MG/24 HOURS) 14 mg/24hr patch Place 1 patch (14 mg total) onto the skin daily. 28 patch 0  . oxyCODONE-acetaminophen (PERCOCET/ROXICET) 5-325 MG tablet Take 1 tablet by mouth every 4 (four) hours as needed (for pain scale 4-7). 10 tablet 0  . oxyCODONE-acetaminophen (PERCOCET/ROXICET) 5-325 MG tablet Take 1-2 tablets by mouth every 4 (four) hours as needed for moderate pain or severe pain. 30 tablet 0   No Known Allergies  Social History:   reports that she has been smoking Cigarettes.  She has a 2.5 pack-year smoking history. She has never used smokeless tobacco. She reports that she uses illicit  drugs (Marijuana). She reports that she does not drink alcohol.  Family History  Problem Relation Age of Onset  . Anesthesia problems Neg Hx   . Cancer Maternal Grandmother   . Hypertension Maternal Grandmother   . Hypertension Mother     Review of Systems: Noncontributory  PHYSICAL EXAM: Blood pressure 139/71, pulse 101, resp. rate 18, unknown if currently breastfeeding. CONSTITUTIONAL: Well-developed, well-nourished female in no acute distress.  HENT:  Normocephalic, atraumatic, External right and left ear normal. Oropharynx is clear and moist EYES: Conjunctivae and EOM are normal. Pupils are equal, round, and reactive to light. No scleral icterus.  NECK: Normal range of motion, supple, no masses SKIN: Skin is warm and dry. No rash noted. Not diaphoretic. No erythema. No pallor. [place]: Alert and oriented to person, place, and time. Normal reflexes, muscle tone coordination. No cranial nerve deficit noted. PSYCHIATRIC: Normal mood and affect. Normal behavior. Normal judgment and thought content. CARDIOVASCULAR: Normal heart rate noted, regular rhythm RESPIRATORY: Effort and breath sounds normal, no problems with respiration noted ABDOMEN: Soft, nondistended, moderate lower abdominal tenderness. Healing cesarean scar PELVIC: Moderate active bleeding noted. Speculum exam deferred for now. MUSCULOSKELETAL: Normal range of motion. No edema and no tenderness. 2+ distal pulses.  Labs: Results for orders placed or performed during the hospital encounter of 09/11/15 (from the past 336 hour(s))  Prepare RBC   Collection Time: 09/11/15  3:13 PM  Result Value Ref Range   Order Confirmation ORDER PROCESSED BY BLOOD BANK   CBC   Collection Time: 09/11/15  3:15 PM  Result Value Ref Range   WBC 9.2 4.0 - 10.5 K/uL   RBC 3.00 (L) 3.87 - 5.11 MIL/uL   Hemoglobin 8.7 (L) 12.0 - 15.0 g/dL   HCT 27.1 (L) 36.0 - 46.0 %   MCV 90.3 78.0 - 100.0 fL   MCH 29.0 26.0 - 34.0 pg   MCHC 32.1 30.0 -  36.0 g/dL   RDW 15.1 11.5 - 15.5 %   Platelets 389 150 - 400 K/uL  DIC (disseminated intravasc coag) panel   Collection Time: 09/11/15  3:15 PM  Result Value Ref Range   Prothrombin Time 14.4 11.6 - 15.2 seconds   INR 1.10 0.00 - 1.49   aPTT 35 24 - 37 seconds   Fibrinogen 315 204 - 475 mg/dL   D-Dimer, Quant 0.40 0.00 - 0.50 ug/mL-FEU   Platelets 406 (H) 150 - 400 K/uL   Smear Review PENDING   Type and screen   Collection Time: 09/11/15  3:15 PM  Result Value Ref Range   ABO/RH(D) AB POS    Antibody Screen PENDING    Sample Expiration 09/14/2015   Results for orders  placed or performed during the hospital encounter of 08/31/15 (from the past 336 hour(s))  CBC   Collection Time: 08/31/15  3:00 AM  Result Value Ref Range   WBC 13.1 (H) 4.0 - 10.5 K/uL   RBC 2.62 (L) 3.87 - 5.11 MIL/uL   Hemoglobin 7.4 (L) 12.0 - 15.0 g/dL   HCT 22.9 (L) 36.0 - 46.0 %   MCV 87.4 78.0 - 100.0 fL   MCH 28.2 26.0 - 34.0 pg   MCHC 32.3 30.0 - 36.0 g/dL   RDW 13.8 11.5 - 15.5 %   Platelets 473 (H) 150 - 400 K/uL  Type and screen [place]   Collection Time: 08/31/15  3:00 AM  Result Value Ref Range   ABO/RH(D) AB POS    Antibody Screen NEG    Sample Expiration 09/03/2015    Unit Number [id number]    Blood Component Type RED CELLS,LR    Unit division 00    Status of Unit ISSUED,FINAL    Transfusion Status OK TO TRANSFUSE    Crossmatch Result Compatible    Unit Number [id number]    Blood Component Type RED CELLS,LR    Unit division 00    Status of Unit ISSUED,FINAL    Transfusion Status OK TO TRANSFUSE    Crossmatch Result Compatible    Unit Number [id number]    Blood Component Type RED CELLS,LR    Unit division 00    Status of Unit ISSUED,FINAL    Transfusion Status OK TO TRANSFUSE    Crossmatch Result Compatible    Unit Number [id number]    Blood Component Type RED CELLS,LR    Unit division 00    Status of Unit REL FROM [facility]    Transfusion Status  OK TO TRANSFUSE    Crossmatch Result Compatible    Unit Number [id number]    Blood Component Type RED CELLS,LR    Unit division 00    Status of Unit ISSUED,FINAL    Transfusion Status OK TO TRANSFUSE    Crossmatch Result Compatible    Unit Number [id number]    Blood Component Type RED CELLS,LR    Unit division 00    Status of Unit REL FROM [facility]    Transfusion Status OK TO TRANSFUSE    Crossmatch Result Compatible    Unit Number [id number]    Blood Component Type RED CELLS,LR    Unit division 00    Status of Unit ISSUED,FINAL    Transfusion Status OK TO TRANSFUSE    Crossmatch Result Compatible    Unit Number [id number]    Blood Component Type RED CELLS,LR    Unit division 00    Status of Unit ISSUED,FINAL    Transfusion Status OK TO TRANSFUSE    Crossmatch Result Compatible    Unit Number [id number]    Blood Component Type RED CELLS,LR    Unit division 00    Status of Unit ISSUED,FINAL    Transfusion Status OK TO TRANSFUSE    Crossmatch Result Compatible   Prepare RBC   Collection Time: 08/31/15  4:00 AM  Result Value Ref Range   Order Confirmation ORDER PROCESSED BY BLOOD BANK   CBC   Collection Time: 08/31/15  6:25 AM  Result Value Ref Range   WBC 13.2 (H) 4.0 - 10.5 K/uL   RBC 2.46 (L) 3.87 - 5.11 MIL/uL   Hemoglobin 7.2 (L) 12.0 - 15.0 g/dL   HCT 21.8 (L) 36.0 - 46.0 %   MCV 88.6  78.0 - 100.0 fL   MCH 29.3 26.0 - 34.0 pg   MCHC 33.0 30.0 - 36.0 g/dL   RDW 14.5 11.5 - 15.5 %   Platelets 220 150 - 400 K/uL  Prepare RBC   Collection Time: 08/31/15  8:03 AM  Result Value Ref Range   Order Confirmation ORDER PROCESSED BY BLOOD BANK   CBC   Collection Time: 08/31/15 11:10 AM  Result Value Ref Range   WBC 17.9 (H) 4.0 - 10.5 K/uL   RBC 2.92 (L) 3.87 - 5.11 MIL/uL   Hemoglobin 8.6 (L) 12.0 - 15.0 g/dL   HCT 25.3 (L) 36.0 - 46.0 %   MCV 86.6 78.0 - 100.0 fL   MCH 29.5 26.0 - 34.0 pg   MCHC 34.0 30.0 - 36.0 g/dL   RDW 13.9 11.5 - 15.5 %    Platelets 226 150 - 400 K/uL  APTT   Collection Time: 08/31/15 11:10 AM  Result Value Ref Range   aPTT 35 24 - 37 seconds  Protime-INR   Collection Time: 08/31/15 11:10 AM  Result Value Ref Range   Prothrombin Time 15.7 (H) 11.6 - 15.2 seconds   INR 1.23 0.00 - 1.49  Fibrinogen   Collection Time: 08/31/15 11:10 AM  Result Value Ref Range   Fibrinogen 317 204 - 475 mg/dL  Urine rapid drug screen (hosp performed)   Collection Time: 08/31/15 12:00 PM  Result Value Ref Range   Opiates POSITIVE (A) NONE DETECTED   Cocaine NONE DETECTED NONE DETECTED   Benzodiazepines POSITIVE (A) NONE DETECTED   Amphetamines NONE DETECTED NONE DETECTED   Tetrahydrocannabinol POSITIVE (A) NONE DETECTED   Barbiturates NONE DETECTED NONE DETECTED  CBC   Collection Time: 08/31/15  2:02 PM  Result Value Ref Range   WBC 16.1 (H) 4.0 - 10.5 K/uL   RBC 2.61 (L) 3.87 - 5.11 MIL/uL   Hemoglobin 7.8 (L) 12.0 - 15.0 g/dL   HCT 22.5 (L) 36.0 - 46.0 %   MCV 86.2 78.0 - 100.0 fL   MCH 29.9 26.0 - 34.0 pg   MCHC 34.7 30.0 - 36.0 g/dL   RDW 14.2 11.5 - 15.5 %   Platelets 211 150 - 400 K/uL  CBC   Collection Time: 09/01/15  7:30 AM  Result Value Ref Range   WBC 14.3 (H) 4.0 - 10.5 K/uL   RBC 2.23 (L) 3.87 - 5.11 MIL/uL   Hemoglobin 6.6 (LL) 12.0 - 15.0 g/dL   HCT 19.1 (L) 36.0 - 46.0 %   MCV 85.7 78.0 - 100.0 fL   MCH 29.6 26.0 - 34.0 pg   MCHC 34.6 30.0 - 36.0 g/dL   RDW 14.8 11.5 - 15.5 %   Platelets 254 150 - 400 K/uL  Prepare RBC   Collection Time: 09/01/15  8:30 AM  Result Value Ref Range   Order Confirmation ORDER PROCESSED BY BLOOD BANK   Urinalysis with microscopic   Collection Time: 09/01/15  5:00 PM  Result Value Ref Range   Color, Urine YELLOW YELLOW   APPearance CLEAR CLEAR   Specific Gravity, Urine 1.010 1.005 - 1.030   pH 6.0 5.0 - 8.0   Glucose, UA NEGATIVE NEGATIVE mg/dL   Hgb urine dipstick LARGE (A) NEGATIVE   Bilirubin Urine NEGATIVE NEGATIVE   Ketones, ur NEGATIVE NEGATIVE  mg/dL   Protein, ur NEGATIVE NEGATIVE mg/dL   Urobilinogen, UA 0.2 0.0 - 1.0 mg/dL   Nitrite NEGATIVE NEGATIVE   Leukocytes, UA MODERATE (A) NEGATIVE  WBC, UA 0-2 <3 WBC/hpf   RBC / HPF 3-6 <3 RBC/hpf   Squamous Epithelial / LPF RARE RARE  CBC   Collection Time: 09/02/15  6:50 AM  Result Value Ref Range   WBC 11.7 (H) 4.0 - 10.5 K/uL   RBC 2.05 (L) 3.87 - 5.11 MIL/uL   Hemoglobin 6.1 (LL) 12.0 - 15.0 g/dL   HCT 17.6 (L) 36.0 - 46.0 %   MCV 85.9 78.0 - 100.0 fL   MCH 29.8 26.0 - 34.0 pg   MCHC 34.7 30.0 - 36.0 g/dL   RDW 14.8 11.5 - 15.5 %   Platelets 207 150 - 400 K/uL  Prepare RBC   Collection Time: 09/02/15  8:00 AM  Result Value Ref Range   Order Confirmation ORDER PROCESSED BY BLOOD BANK   CBC   Collection Time: 09/02/15  7:08 PM  Result Value Ref Range   WBC 13.1 (H) 4.0 - 10.5 K/uL   RBC 2.75 (L) 3.87 - 5.11 MIL/uL   Hemoglobin 8.0 (L) 12.0 - 15.0 g/dL   HCT 23.7 (L) 36.0 - 46.0 %   MCV 86.2 78.0 - 100.0 fL   MCH 29.1 26.0 - 34.0 pg   MCHC 33.8 30.0 - 36.0 g/dL   RDW 14.8 11.5 - 15.5 %   Platelets 221 150 - 400 K/uL  CBC   Collection Time: 09/03/15  5:25 AM  Result Value Ref Range   WBC 11.9 (H) 4.0 - 10.5 K/uL   RBC 2.66 (L) 3.87 - 5.11 MIL/uL   Hemoglobin 7.8 (L) 12.0 - 15.0 g/dL   HCT 22.9 (L) 36.0 - 46.0 %   MCV 86.1 78.0 - 100.0 fL   MCH 29.3 26.0 - 34.0 pg   MCHC 34.1 30.0 - 36.0 g/dL   RDW 15.0 11.5 - 15.5 %   Platelets 218 150 - 400 K/uL  CBC   Collection Time: 09/04/15  5:25 AM  Result Value Ref Range   WBC 11.0 (H) 4.0 - 10.5 K/uL   RBC 2.75 (L) 3.87 - 5.11 MIL/uL   Hemoglobin 8.1 (L) 12.0 - 15.0 g/dL   HCT 24.0 (L) 36.0 - 46.0 %   MCV 87.3 78.0 - 100.0 fL   MCH 29.5 26.0 - 34.0 pg   MCHC 33.8 30.0 - 36.0 g/dL   RDW 14.7 11.5 - 15.5 %   Platelets 217 150 - 400 K/uL    Imaging Studies: Ct Head Wo Contrast  09/01/2015  CLINICAL DATA:  Right arm numbness postpartum.  Headache. EXAM: CT HEAD WITHOUT CONTRAST TECHNIQUE: Contiguous axial  images were obtained from the base of the skull through the vertex without intravenous contrast. COMPARISON:  Feb 24, 2015 FINDINGS: The ventricles are normal in size and configuration. Mild prominence of the cisterna magna is a stable anatomic variant. There is no intracranial mass, hemorrhage, extra-axial fluid collection, or midline shift. The gray-white compartments are normal. No acute infarct evident. The bony calvarium appears intact. The visualized mastoid air cells are clear. IMPRESSION: No intracranial mass, hemorrhage, or focal gray - white compartment lesions/acute appearing infarct. Electronically Signed   By: Lowella Grip III M.D.   On: 09/01/2015 09:29   Ct Angio Chest Pe W/cm &/or Wo Cm  08/28/2015  CLINICAL DATA:  Dysuria and fever for 2 days. Leukocytosis. Cesarean section on 08/11/2015. EXAM: CT ANGIOGRAPHY CHEST, ABDOMEN AND PELVIS TECHNIQUE: Multidetector CT imaging through the chest, abdomen and pelvis was performed using the standard protocol during bolus administration of intravenous contrast. Multiplanar  reconstructed images and MIPs were obtained and reviewed to evaluate the vascular anatomy. CONTRAST:  154mL OMNIPAQUE IOHEXOL 350 MG/ML SOLN COMPARISON:  None. FINDINGS: CTA CHEST FINDINGS The thoracic aorta is normal in caliber and intact. There is no dissection. The pulmonary arteries are well opacified. There is no pulmonary embolism. Review of the MIP images confirms the above findings. The lungs are clear except for minimal atelectatic appearing posterior base opacities. Central airways are patent. There is no adenopathy in the mediastinum or hila. There are no pleural effusions. CTA ABDOMEN AND PELVIS FINDINGS The abdominal aorta is normal in caliber and intact. Review of the MIP images confirms the above findings. There are normal appearances of the liver, gallbladder, pancreas, spleen, adrenals and kidneys. Bowel is unremarkable. The postpartum uterus is grossly unremarkable.  The Cesarean section scar is visible at the anterior surface of the lower uterine segment. There is a small amount of fluid around the uterus but there is no evidence of a drainable hematoma or abscess. The low transverse abdominal incision is unremarkable, with no evidence of a subcutaneous collection. IMPRESSION: 1. No acute findings in the chest except for minimal atelectatic appearing posterior base opacities. 2. Small volume fluid surrounding the postpartum uterus without drainable abscess or hematoma. Electronically Signed   By: Andreas Newport M.D.   On: 08/28/2015 06:51   US Transvaginal Non-ob  08/31/2015  CLINICAL DATA:  Postpartum bleeding. Recent emergency C-section for uterine rupture. Recurrent delayed postpartum hemorrhage. EXAM: TRANSABDOMINAL AND TRANSVAGINAL ULTRASOUND OF PELVIS TECHNIQUE: Both transabdominal and transvaginal ultrasound examinations of the pelvis were performed. Transabdominal technique was performed for global imaging of the pelvis including uterus, ovaries, adnexal regions, and pelvic cul-de-sac. It was necessary to proceed with endovaginal exam following the transabdominal exam to visualize the endometrium. COMPARISON:  07/22/2015.  Abdominal CT from 3 days ago FINDINGS: The uterus measures 13 x 4 x 7 cm, expected for recent delivery. A Cesarean section scar has an unremarkable appearance with echogenic foci likely reflecting suture and hypoechoic redundant appearance commonly encounter. There is bladder flap hematoma and pelvic side wall hematoma which appears similar to CT 3 days ago. There is a hypoechoic structure within the posterior right lower uterine segment with swirling color Doppler flow and blood pool density on previous CT. Appearance consistent with pseudoaneurysm, less likely varix based on the pattern of flow, (a varix would still be pathologic at this size). Posterior bladder wall thickening, stable from CT. These results were called by telephone at the  time of interpretation on 08/31/2015 at 4:43 am to Dr. Kerry Hough , who verbally acknowledged these results. IMPRESSION: 1. 24 mm pseudoaneurysm in the right posterior lower uterine segment. 2. Bladder flap and sidewall hematoma, grossly stable from abdominal CT 3 days ago. Electronically Signed   By: Monte Fantasia M.D.   On: 08/31/2015 04:54   US Pelvis Complete  08/31/2015  CLINICAL DATA:  Postpartum bleeding. Recent emergency C-section for uterine rupture. Recurrent delayed postpartum hemorrhage. EXAM: TRANSABDOMINAL AND TRANSVAGINAL ULTRASOUND OF PELVIS TECHNIQUE: Both transabdominal and transvaginal ultrasound examinations of the pelvis were performed. Transabdominal technique was performed for global imaging of the pelvis including uterus, ovaries, adnexal regions, and pelvic cul-de-sac. It was necessary to proceed with endovaginal exam following the transabdominal exam to visualize the endometrium. COMPARISON:  07/22/2015.  Abdominal CT from 3 days ago FINDINGS: The uterus measures 13 x 4 x 7 cm, expected for recent delivery. A Cesarean section scar has an unremarkable appearance with echogenic foci likely  reflecting suture and hypoechoic redundant appearance commonly encounter. There is bladder flap hematoma and pelvic side wall hematoma which appears similar to CT 3 days ago. There is a hypoechoic structure within the posterior right lower uterine segment with swirling color Doppler flow and blood pool density on previous CT. Appearance consistent with pseudoaneurysm, less likely varix based on the pattern of flow, (a varix would still be pathologic at this size). Posterior bladder wall thickening, stable from CT. These results were called by telephone at the time of interpretation on 08/31/2015 at 4:43 am to Dr. Kerry Hough , who verbally acknowledged these results. IMPRESSION: 1. 24 mm pseudoaneurysm in the right posterior lower uterine segment. 2. Bladder flap and sidewall hematoma, grossly stable  from abdominal CT 3 days ago. Electronically Signed   By: Monte Fantasia M.D.   On: 08/31/2015 04:54   Ct Abdomen Pelvis W Contrast  08/28/2015  CLINICAL DATA:  Dysuria and fever for 2 days. Leukocytosis. Cesarean section on 08/11/2015. EXAM: CT ANGIOGRAPHY CHEST, ABDOMEN AND PELVIS TECHNIQUE: Multidetector CT imaging through the chest, abdomen and pelvis was performed using the standard protocol during bolus administration of intravenous contrast. Multiplanar reconstructed images and MIPs were obtained and reviewed to evaluate the vascular anatomy. CONTRAST:  147mL OMNIPAQUE IOHEXOL 350 MG/ML SOLN COMPARISON:  None. FINDINGS: CTA CHEST FINDINGS The thoracic aorta is normal in caliber and intact. There is no dissection. The pulmonary arteries are well opacified. There is no pulmonary embolism. Review of the MIP images confirms the above findings. The lungs are clear except for minimal atelectatic appearing posterior base opacities. Central airways are patent. There is no adenopathy in the mediastinum or hila. There are no pleural effusions. CTA ABDOMEN AND PELVIS FINDINGS The abdominal aorta is normal in caliber and intact. Review of the MIP images confirms the above findings. There are normal appearances of the liver, gallbladder, pancreas, spleen, adrenals and kidneys. Bowel is unremarkable. The postpartum uterus is grossly unremarkable. The Cesarean section scar is visible at the anterior surface of the lower uterine segment. There is a small amount of fluid around the uterus but there is no evidence of a drainable hematoma or abscess. The low transverse abdominal incision is unremarkable, with no evidence of a subcutaneous collection. IMPRESSION: 1. No acute findings in the chest except for minimal atelectatic appearing posterior base opacities. 2. Small volume fluid surrounding the postpartum uterus without drainable abscess or hematoma. Electronically Signed   By: Andreas Newport M.D.   On: 08/28/2015  06:51   Ir Angiogram Pelvis Selective Or Supraselective  08/31/2015  INDICATION: Postpartum hemorrhage with pelvic ultrasound demonstrating an approximately 2.4 cm pseudoaneurysm within the right posterior lower uterine segment. Please perform pelvic arteriogram and potential percutaneous embolization. EXAM: 1. ULTRASOUND GUIDANCE FOR ARTERIAL ACCESS. 2. PELVIC ARTERIOGRAM 3. SELECTIVE BILATERAL INTERNAL ILIAC ARTERIOGRAMS (3rd ORDER) 4. SELECTIVE RIGHT UTERINE ARTERIOGRAM AND PERCUTANEOUS PARTICLE EMBOLIZATION (3rd ORDER) MEDICATIONS: None ANESTHESIA/SEDATION: Fentanyl 25 mcg IV; Versed 0.5 mg IV Sedation time 75 minutes CONTRAST:  1103mL OMNIPAQUE IOHEXOL 300 MG/ML  SOLN FLUOROSCOPY TIME:  17 minutes 54 seconds (4098.1 mGy) COMPLICATIONS: None immediate PROCEDURE: Informed consent was obtained from the patient following explanation of the procedure, risks, benefits and alternatives. The patient understands, agrees and consents for the procedure. All questions were addressed. A time out was performed prior to the initiation of the procedure. Maximal barrier sterile technique utilized including caps, mask, sterile gowns, sterile gloves, large sterile drape, hand hygiene, and Betadine prep. The right femoral head was  marked fluoroscopically. Under sterile conditions and local anesthesia, the right common femoral artery access was performed with a micropuncture needle. Under direct ultrasound guidance, the right common femoral was accessed with a micropuncture kit. An ultrasound image was saved for documentation purposes. This allowed for placement of a 5-French vascular sheath. A limited arteriogram was performed through the side arm of the sheath confirming appropriate access within the right common femoral artery. Over a Bentson wire, a pigtail Flush catheter was advanced to the level of the caudal aspect of the abdominal aorta and a pelvic arteriogram was performed. The 5-French C2 catheter was utilized to  select the contralateral left internal iliac artery. Selective left internal iliac angiogram was performed. The C2 catheter was retracted and utilized to select the ipsilateral right internal iliac artery. Selective right internal iliac angiogram was performed. The patent right uterine artery was identified. For selective catheterization, a Fathom 0.014 micro wire was utilized to advance a regular Renegade microcatheter into the right uterine artery and a selective right uterine arteriogram was performed. The distal vascular tree of the right uterine artery was embolized with approximately 1.5 vials of 700-900 micron Embospheres. Post embolization right uterine arteriogram was performed At this point, all wires, catheters and sheaths were removed from the patient. Hemostasis was achieved at the right groin access site with The patient tolerated the procedure well without immediate post procedural complication. FINDINGS: Initial pelvic arteriogram does not definitely demonstrate active extravasation from either uterine arteries. Selective left internal iliac arteriogram demonstrates a normal appearing left uterine artery without definitive area of vessel irregularity or extravasation. Selective right internal iliac arteriogram was performed demonstrating patency of the tortuous right uterine artery but without definitive area of active extravasation or contribution to the patient's known pseudoaneurysm within the right lower uterine segment. As such, a selective right uterine arteriogram was performed which in hind site likely contributed to a tiny pseudoaneurysm (representative series 9) which correlates with the finding seen on the preceding pelvic ultrasound (note, this pseudoaneurysm appears smaller than on preceding pelvic ultrasound and was not definitively recognized at the time of catheter directed angiography). Given the findings seen on preceding pelvic ultrasound, the decision was made to perform a  prophylactic particle embolization of the right uterine artery vascular territory which was subsequently accomplished with the administration of approximately 1.5 vials of 700 to 900 micron Embospheres. (Note, 1 of the post embolization arteriogram demonstrates retrograde filling of a hypertrophied right ovarian artery - series 16). Completion arteriogram demonstrates a technically adequate results with near complete stasis of the right uterine artery vascular tree (representative series 18 and 19). IMPRESSION: Technically successful right uterine artery percutaneous particle embolization for postpartum hemorrhage. Despite selective catheterization of the right uterine artery, the patient's known large (approximately 2.4 cm) pseudoaneurysm within the right lower uterine segment was suboptimally visualized, potentially indicative of interval partial thrombosis. Above findings discussed with Dr. Kennon Rounds at the time of procedure completion. Electronically Signed   By: Sandi Mariscal M.D.   On: 08/31/2015 10:23   Ir Angiogram Selective Each Additional Vessel  08/31/2015  INDICATION: Postpartum hemorrhage with pelvic ultrasound demonstrating an approximately 2.4 cm pseudoaneurysm within the right posterior lower uterine segment. Please perform pelvic arteriogram and potential percutaneous embolization. EXAM: 1. ULTRASOUND GUIDANCE FOR ARTERIAL ACCESS. 2. PELVIC ARTERIOGRAM 3. SELECTIVE BILATERAL INTERNAL ILIAC ARTERIOGRAMS (3rd ORDER) 4. SELECTIVE RIGHT UTERINE ARTERIOGRAM AND PERCUTANEOUS PARTICLE EMBOLIZATION (3rd ORDER) MEDICATIONS: None ANESTHESIA/SEDATION: Fentanyl 25 mcg IV; Versed 0.5 mg IV Sedation time 75 minutes CONTRAST:  138mL OMNIPAQUE IOHEXOL 300 MG/ML  SOLN FLUOROSCOPY TIME:  17 minutes 54 seconds (9381.8 mGy) COMPLICATIONS: None immediate PROCEDURE: Informed consent was obtained from the patient following explanation of the procedure, risks, benefits and alternatives. The patient understands, agrees and  consents for the procedure. All questions were addressed. A time out was performed prior to the initiation of the procedure. Maximal barrier sterile technique utilized including caps, mask, sterile gowns, sterile gloves, large sterile drape, hand hygiene, and Betadine prep. The right femoral head was marked fluoroscopically. Under sterile conditions and local anesthesia, the right common femoral artery access was performed with a micropuncture needle. Under direct ultrasound guidance, the right common femoral was accessed with a micropuncture kit. An ultrasound image was saved for documentation purposes. This allowed for placement of a 5-French vascular sheath. A limited arteriogram was performed through the side arm of the sheath confirming appropriate access within the right common femoral artery. Over a Bentson wire, a pigtail Flush catheter was advanced to the level of the caudal aspect of the abdominal aorta and a pelvic arteriogram was performed. The 5-French C2 catheter was utilized to select the contralateral left internal iliac artery. Selective left internal iliac angiogram was performed. The C2 catheter was retracted and utilized to select the ipsilateral right internal iliac artery. Selective right internal iliac angiogram was performed. The patent right uterine artery was identified. For selective catheterization, a Fathom 0.014 micro wire was utilized to advance a regular Renegade microcatheter into the right uterine artery and a selective right uterine arteriogram was performed. The distal vascular tree of the right uterine artery was embolized with approximately 1.5 vials of 700-900 micron Embospheres. Post embolization right uterine arteriogram was performed At this point, all wires, catheters and sheaths were removed from the patient. Hemostasis was achieved at the right groin access site with The patient tolerated the procedure well without immediate post procedural complication. FINDINGS: Initial  pelvic arteriogram does not definitely demonstrate active extravasation from either uterine arteries. Selective left internal iliac arteriogram demonstrates a normal appearing left uterine artery without definitive area of vessel irregularity or extravasation. Selective right internal iliac arteriogram was performed demonstrating patency of the tortuous right uterine artery but without definitive area of active extravasation or contribution to the patient's known pseudoaneurysm within the right lower uterine segment. As such, a selective right uterine arteriogram was performed which in hind site likely contributed to a tiny pseudoaneurysm (representative series 9) which correlates with the finding seen on the preceding pelvic ultrasound (note, this pseudoaneurysm appears smaller than on preceding pelvic ultrasound and was not definitively recognized at the time of catheter directed angiography). Given the findings seen on preceding pelvic ultrasound, the decision was made to perform a prophylactic particle embolization of the right uterine artery vascular territory which was subsequently accomplished with the administration of approximately 1.5 vials of 700 to 900 micron Embospheres. (Note, 1 of the post embolization arteriogram demonstrates retrograde filling of a hypertrophied right ovarian artery - series 16). Completion arteriogram demonstrates a technically adequate results with near complete stasis of the right uterine artery vascular tree (representative series 18 and 19). IMPRESSION: Technically successful right uterine artery percutaneous particle embolization for postpartum hemorrhage. Despite selective catheterization of the right uterine artery, the patient's known large (approximately 2.4 cm) pseudoaneurysm within the right lower uterine segment was suboptimally visualized, potentially indicative of interval partial thrombosis. Above findings discussed with Dr. Kennon Rounds at the time of procedure completion.  Electronically Signed   By: Eldridge Abrahams.D.  On: 08/31/2015 10:23   Ir Angiogram Follow Up Study  08/31/2015  INDICATION: Postpartum hemorrhage with pelvic ultrasound demonstrating an approximately 2.4 cm pseudoaneurysm within the right posterior lower uterine segment. Please perform pelvic arteriogram and potential percutaneous embolization. EXAM: 1. ULTRASOUND GUIDANCE FOR ARTERIAL ACCESS. 2. PELVIC ARTERIOGRAM 3. SELECTIVE BILATERAL INTERNAL ILIAC ARTERIOGRAMS (3rd ORDER) 4. SELECTIVE RIGHT UTERINE ARTERIOGRAM AND PERCUTANEOUS PARTICLE EMBOLIZATION (3rd ORDER) MEDICATIONS: None ANESTHESIA/SEDATION: Fentanyl 25 mcg IV; Versed 0.5 mg IV Sedation time 75 minutes CONTRAST:  134mL OMNIPAQUE IOHEXOL 300 MG/ML  SOLN FLUOROSCOPY TIME:  17 minutes 54 seconds (1700.1 mGy) COMPLICATIONS: None immediate PROCEDURE: Informed consent was obtained from the patient following explanation of the procedure, risks, benefits and alternatives. The patient understands, agrees and consents for the procedure. All questions were addressed. A time out was performed prior to the initiation of the procedure. Maximal barrier sterile technique utilized including caps, mask, sterile gowns, sterile gloves, large sterile drape, hand hygiene, and Betadine prep. The right femoral head was marked fluoroscopically. Under sterile conditions and local anesthesia, the right common femoral artery access was performed with a micropuncture needle. Under direct ultrasound guidance, the right common femoral was accessed with a micropuncture kit. An ultrasound image was saved for documentation purposes. This allowed for placement of a 5-French vascular sheath. A limited arteriogram was performed through the side arm of the sheath confirming appropriate access within the right common femoral artery. Over a Bentson wire, a pigtail Flush catheter was advanced to the level of the caudal aspect of the abdominal aorta and a pelvic arteriogram was performed. The  5-French C2 catheter was utilized to select the contralateral left internal iliac artery. Selective left internal iliac angiogram was performed. The C2 catheter was retracted and utilized to select the ipsilateral right internal iliac artery. Selective right internal iliac angiogram was performed. The patent right uterine artery was identified. For selective catheterization, a Fathom 0.014 micro wire was utilized to advance a regular Renegade microcatheter into the right uterine artery and a selective right uterine arteriogram was performed. The distal vascular tree of the right uterine artery was embolized with approximately 1.5 vials of 700-900 micron Embospheres. Post embolization right uterine arteriogram was performed At this point, all wires, catheters and sheaths were removed from the patient. Hemostasis was achieved at the right groin access site with The patient tolerated the procedure well without immediate post procedural complication. FINDINGS: Initial pelvic arteriogram does not definitely demonstrate active extravasation from either uterine arteries. Selective left internal iliac arteriogram demonstrates a normal appearing left uterine artery without definitive area of vessel irregularity or extravasation. Selective right internal iliac arteriogram was performed demonstrating patency of the tortuous right uterine artery but without definitive area of active extravasation or contribution to the patient's known pseudoaneurysm within the right lower uterine segment. As such, a selective right uterine arteriogram was performed which in hind site likely contributed to a tiny pseudoaneurysm (representative series 9) which correlates with the finding seen on the preceding pelvic ultrasound (note, this pseudoaneurysm appears smaller than on preceding pelvic ultrasound and was not definitively recognized at the time of catheter directed angiography). Given the findings seen on preceding pelvic ultrasound, the  decision was made to perform a prophylactic particle embolization of the right uterine artery vascular territory which was subsequently accomplished with the administration of approximately 1.5 vials of 700 to 900 micron Embospheres. (Note, 1 of the post embolization arteriogram demonstrates retrograde filling of a hypertrophied right ovarian artery - series 16). Completion arteriogram demonstrates  a technically adequate results with near complete stasis of the right uterine artery vascular tree (representative series 18 and 19). IMPRESSION: Technically successful right uterine artery percutaneous particle embolization for postpartum hemorrhage. Despite selective catheterization of the right uterine artery, the patient's known large (approximately 2.4 cm) pseudoaneurysm within the right lower uterine segment was suboptimally visualized, potentially indicative of interval partial thrombosis. Above findings discussed with Dr. Kennon Rounds at the time of procedure completion. Electronically Signed   By: Sandi Mariscal M.D.   On: 08/31/2015 10:23   Ir US Guide Vasc Access Right  08/31/2015  INDICATION: Postpartum hemorrhage with pelvic ultrasound demonstrating an approximately 2.4 cm pseudoaneurysm within the right posterior lower uterine segment. Please perform pelvic arteriogram and potential percutaneous embolization. EXAM: 1. ULTRASOUND GUIDANCE FOR ARTERIAL ACCESS. 2. PELVIC ARTERIOGRAM 3. SELECTIVE BILATERAL INTERNAL ILIAC ARTERIOGRAMS (3rd ORDER) 4. SELECTIVE RIGHT UTERINE ARTERIOGRAM AND PERCUTANEOUS PARTICLE EMBOLIZATION (3rd ORDER) MEDICATIONS: None ANESTHESIA/SEDATION: Fentanyl 25 mcg IV; Versed 0.5 mg IV Sedation time 75 minutes CONTRAST:  177mL OMNIPAQUE IOHEXOL 300 MG/ML  SOLN FLUOROSCOPY TIME:  17 minutes 54 seconds (1601.0 mGy) COMPLICATIONS: None immediate PROCEDURE: Informed consent was obtained from the patient following explanation of the procedure, risks, benefits and alternatives. The patient understands,  agrees and consents for the procedure. All questions were addressed. A time out was performed prior to the initiation of the procedure. Maximal barrier sterile technique utilized including caps, mask, sterile gowns, sterile gloves, large sterile drape, hand hygiene, and Betadine prep. The right femoral head was marked fluoroscopically. Under sterile conditions and local anesthesia, the right common femoral artery access was performed with a micropuncture needle. Under direct ultrasound guidance, the right common femoral was accessed with a micropuncture kit. An ultrasound image was saved for documentation purposes. This allowed for placement of a 5-French vascular sheath. A limited arteriogram was performed through the side arm of the sheath confirming appropriate access within the right common femoral artery. Over a Bentson wire, a pigtail Flush catheter was advanced to the level of the caudal aspect of the abdominal aorta and a pelvic arteriogram was performed. The 5-French C2 catheter was utilized to select the contralateral left internal iliac artery. Selective left internal iliac angiogram was performed. The C2 catheter was retracted and utilized to select the ipsilateral right internal iliac artery. Selective right internal iliac angiogram was performed. The patent right uterine artery was identified. For selective catheterization, a Fathom 0.014 micro wire was utilized to advance a regular Renegade microcatheter into the right uterine artery and a selective right uterine arteriogram was performed. The distal vascular tree of the right uterine artery was embolized with approximately 1.5 vials of 700-900 micron Embospheres. Post embolization right uterine arteriogram was performed At this point, all wires, catheters and sheaths were removed from the patient. Hemostasis was achieved at the right groin access site with The patient tolerated the procedure well without immediate post procedural complication.  FINDINGS: Initial pelvic arteriogram does not definitely demonstrate active extravasation from either uterine arteries. Selective left internal iliac arteriogram demonstrates a normal appearing left uterine artery without definitive area of vessel irregularity or extravasation. Selective right internal iliac arteriogram was performed demonstrating patency of the tortuous right uterine artery but without definitive area of active extravasation or contribution to the patient's known pseudoaneurysm within the right lower uterine segment. As such, a selective right uterine arteriogram was performed which in hind site likely contributed to a tiny pseudoaneurysm (representative series 9) which correlates with the finding seen on the preceding pelvic  ultrasound (note, this pseudoaneurysm appears smaller than on preceding pelvic ultrasound and was not definitively recognized at the time of catheter directed angiography). Given the findings seen on preceding pelvic ultrasound, the decision was made to perform a prophylactic particle embolization of the right uterine artery vascular territory which was subsequently accomplished with the administration of approximately 1.5 vials of 700 to 900 micron Embospheres. (Note, 1 of the post embolization arteriogram demonstrates retrograde filling of a hypertrophied right ovarian artery - series 16). Completion arteriogram demonstrates a technically adequate results with near complete stasis of the right uterine artery vascular tree (representative series 18 and 19). IMPRESSION: Technically successful right uterine artery percutaneous particle embolization for postpartum hemorrhage. Despite selective catheterization of the right uterine artery, the patient's known large (approximately 2.4 cm) pseudoaneurysm within the right lower uterine segment was suboptimally visualized, potentially indicative of interval partial thrombosis. Above findings discussed with Dr. Kennon Rounds at the time of  procedure completion. Electronically Signed   By: Sandi Mariscal M.D.   On: 08/31/2015 10:23   [place] Guide [employer]  08/31/2015  INDICATION: Postpartum hemorrhage with pelvic ultrasound demonstrating an approximately 2.4 cm pseudoaneurysm within the right posterior lower uterine segment. Please perform pelvic arteriogram and potential percutaneous embolization. EXAM: 1. ULTRASOUND GUIDANCE FOR ARTERIAL ACCESS. 2. PELVIC ARTERIOGRAM 3. SELECTIVE BILATERAL INTERNAL ILIAC ARTERIOGRAMS (3rd ORDER) 4. SELECTIVE RIGHT UTERINE ARTERIOGRAM AND PERCUTANEOUS PARTICLE EMBOLIZATION (3rd ORDER) MEDICATIONS: None ANESTHESIA/SEDATION: Fentanyl 25 mcg IV; Versed 0.5 mg IV Sedation time 75 minutes CONTRAST:  155mL OMNIPAQUE IOHEXOL 300 MG/ML  SOLN FLUOROSCOPY TIME:  17 minutes 54 seconds (6553.7 mGy) COMPLICATIONS: None immediate PROCEDURE: Informed consent was obtained from the patient following explanation of the procedure, risks, benefits and alternatives. The patient understands, agrees and consents for the procedure. All questions were addressed. A time out was performed prior to the initiation of the procedure. Maximal barrier sterile technique utilized including caps, mask, sterile gowns, sterile gloves, large sterile drape, hand hygiene, and Betadine prep. The right femoral head was marked fluoroscopically. Under sterile conditions and local anesthesia, the right common femoral artery access was performed with a micropuncture needle. Under direct ultrasound guidance, the right common femoral was accessed with a micropuncture kit. An ultrasound image was saved for documentation purposes. This allowed for placement of a 5-French vascular sheath. A limited arteriogram was performed through the side arm of the sheath confirming appropriate access within the right common femoral artery. Over a Bentson wire, a pigtail Flush catheter was advanced to the level of the caudal aspect of the  abdominal aorta and a pelvic arteriogram was performed. The 5-French C2 catheter was utilized to select the contralateral left internal iliac artery. Selective left internal iliac angiogram was performed. The C2 catheter was retracted and utilized to select the ipsilateral right internal iliac artery. Selective right internal iliac angiogram was performed. The patent right uterine artery was identified. For selective catheterization, a Fathom 0.014 micro wire was utilized to advance a regular Renegade microcatheter into the right uterine artery and a selective right uterine arteriogram was performed. The distal vascular tree of the right uterine artery was embolized with approximately 1.5 vials of 700-900 micron Embospheres. Post embolization right uterine arteriogram was performed At this point, all wires, catheters and sheaths were removed from the patient. Hemostasis was achieved at the right groin access site with The patient tolerated the procedure well without immediate post procedural complication. FINDINGS: Initial pelvic arteriogram does not definitely demonstrate  active extravasation from either uterine arteries. Selective left internal iliac arteriogram demonstrates a normal appearing left uterine artery without definitive area of vessel irregularity or extravasation. Selective right internal iliac arteriogram was performed demonstrating patency of the tortuous right uterine artery but without definitive area of active extravasation or contribution to the patient's known pseudoaneurysm within the right lower uterine segment. As such, a selective right uterine arteriogram was performed which in hind site likely contributed to a tiny pseudoaneurysm (representative series 9) which correlates with the finding seen on the preceding pelvic ultrasound (note, this pseudoaneurysm appears smaller than on preceding pelvic ultrasound and was not definitively recognized at the time of catheter directed angiography).  Given the findings seen on preceding pelvic ultrasound, the decision was made to perform a prophylactic particle embolization of the right uterine artery vascular territory which was subsequently accomplished with the administration of approximately 1.5 vials of 700 to 900 micron Embospheres. (Note, 1 of the post embolization arteriogram demonstrates retrograde filling of a hypertrophied right ovarian artery - series 16). Completion arteriogram demonstrates a technically adequate results with near complete stasis of the right uterine artery vascular tree (representative series 18 and 19). IMPRESSION: Technically successful right uterine artery percutaneous particle embolization for postpartum hemorrhage. Despite selective catheterization of the right uterine artery, the patient's known large (approximately 2.4 cm) pseudoaneurysm within the right lower uterine segment was suboptimally visualized, potentially indicative of interval partial thrombosis. Above findings discussed with Dr. Kennon Rounds at the time of procedure completion. Electronically Signed   By: Sandi Mariscal M.D.   On: 08/31/2015 10:23    Assessment: Patient Active Problem List   Diagnosis Date Noted  . Acute blood loss anemia 09/01/2015  . Delayed postpartum hemorrhage 08/31/2015  . Anemia in pregancy, deliv, with postpartum complic, curr hospitaliz 08/17/2015  . Postpartum hemorrhage, delayed (> 24 hrs) 08/16/2015  . Pre-eclampsia during pregnancy in third trimester, antepartum 08/11/2015  . S/P emergency cesarean section 08/11/2015  . Gestational hypertension w/o significant proteinuria in 3rd trimester 08/10/2015  . Group B Streptococcus carrier, antepartum 08/04/2015  . Short interval between pregnancies affecting pregnancy, antepartum 04/06/2015  . 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 History of suicidal ideation 03/15/2015  . Chest pain 03/14/2015  . Intermittent palpitations 03/14/2015  . History of  cesarean delivery affecting pregnancy 03/09/2015  . Syncopal episodes 03/09/2015  . History of gestational hypertension 09/02/2014    Plan: Patient will undergo surgical management with total abdominal hysterectomy for recurrent delayed postpartum hemorrhage despite uterine artery embolization, multiple transfusions and other interventions.   The risks of surgery were discussed in detail with the patient including but not limited to: bleeding which may require transfusion or reoperation; infection which may require antibiotics; injury to surrounding organs which may involve bowel, bladder, ureters ; need for additional procedures; thromboembolic phenomenon, surgical site problems and other postoperative/anesthesia complications.  High likelihood of success in alleviating the patient's condition was discussed. Routine postoperative instructions will be reviewed with the patient and her family in detail after surgery.  The patient concurred with the proposed plan, giving informed written consent for the surgery.  Patient has been NPO since last night she will remain NPO for procedure.  Anesthesia and OR aware.  Preoperative prophylactic antibiotics and SCDs ordered on call to the OR.  To OR when ready.  Verita Schneiders, M.D. 09/11/2015 3:44 PM

## 2015-09-11 NOTE — Anesthesia Procedure Notes (Signed)
Procedure Name: Intubation Date/Time: 09/11/2015 4:05 PM Performed by: Madysin Crisp G Pre-anesthesia Checklist: Patient identified, Emergency Drugs available, Suction available, Patient being monitored and Timeout performed Patient Re-evaluated:Patient Re-evaluated prior to inductionOxygen Delivery Method: Circle system utilized Preoxygenation: Pre-oxygenation with 100% oxygen Intubation Type: IV induction Ventilation: Mask ventilation without difficulty Laryngoscope Size: Mac and 3 Grade View: Grade I Tube type: Oral Tube size: 7.0 mm Airway Equipment and Method: Video-laryngoscopy Placement Confirmation: ETT inserted through vocal cords under direct vision,  positive ETCO2 and breath sounds checked- equal and bilateral Secured at: 23 cm Tube secured with: Tape Dental Injury: Teeth and Oropharynx as per pre-operative assessment

## 2015-09-11 NOTE — MAU Note (Signed)
Pt arrived via EMS for vaginal bleeding.  Pt post C/S on 10/20 for uterine rupture. Dr. Macon LargeAnyanwu at the bedside.

## 2015-09-12 ENCOUNTER — Encounter (HOSPITAL_COMMUNITY): Payer: Self-pay | Admitting: *Deleted

## 2015-09-12 LAB — CBC
HCT: 27.7 % — ABNORMAL LOW (ref 36.0–46.0)
HEMOGLOBIN: 9.6 g/dL — AB (ref 12.0–15.0)
MCH: 29.8 pg (ref 26.0–34.0)
MCHC: 34.7 g/dL (ref 30.0–36.0)
MCV: 86 fL (ref 78.0–100.0)
PLATELETS: 280 10*3/uL (ref 150–400)
RBC: 3.22 MIL/uL — ABNORMAL LOW (ref 3.87–5.11)
RDW: 15.4 % (ref 11.5–15.5)
WBC: 17.7 10*3/uL — ABNORMAL HIGH (ref 4.0–10.5)

## 2015-09-12 MED ORDER — OXYCODONE HCL 5 MG PO TABS
10.0000 mg | ORAL_TABLET | ORAL | Status: DC | PRN
  Administered 2015-09-12: 20 mg via ORAL
  Administered 2015-09-12: 10 mg via ORAL
  Administered 2015-09-12 – 2015-09-13 (×4): 20 mg via ORAL
  Filled 2015-09-12: qty 2
  Filled 2015-09-12 (×5): qty 4

## 2015-09-12 MED ORDER — ACETAMINOPHEN 500 MG PO TABS
1000.0000 mg | ORAL_TABLET | Freq: Four times a day (QID) | ORAL | Status: DC | PRN
  Administered 2015-09-12 – 2015-09-13 (×3): 1000 mg via ORAL
  Filled 2015-09-12 (×3): qty 2

## 2015-09-12 NOTE — Progress Notes (Signed)
1 Day Post-Op Procedure(s) (LRB): SUPRACERVICAL ABDOMINAL HYSTERECTOMY,  (N/A) BILATERAL SALPINGECTOMY for recurrent episodes of postpartum hemorrhage since delivery on 08/11/15  Subjective: Patient reports nausea, vomiting, incisional pain, tolerating PO and no problems voiding.  Desires Percocet 10s for pain, does not want Dilaudid due to sedating effects   Objective: I have reviewed patient's vital signs, intake and output, medications and labs. Temp:  [97.8 F (36.6 C)-98.9 F (37.2 C)] 97.9 F (36.6 C) (11/21 0526) Pulse Rate:  [66-101] 67 (11/21 0526) Resp:  [12-21] 20 (11/21 0526) BP: (135-146)/(64-93) 135/64 mmHg (11/21 0526) SpO2:  [100 %] 100 % (11/21 0526) 11/20 0701 - 11/21 0700 In: 4695 [P.O.:499; I.V.:3200; Blood:996] Out: 1480 [Urine:880; Blood:600]  General: alert and no distress Resp: clear to auscultation bilaterally Cardio: regular rate and rhythm GI: soft, non-tender; bowel sounds normal; no masses,  no organomegaly and incision: clean, dry, intact and no drainage present Extremities: extremities normal, atraumatic, no cyanosis or edema, Homans sign is negative, no sign of DVT and no edema, redness or tenderness in the calves or thighs Vaginal Bleeding: minimal  CBC Latest Ref Rng 09/12/2015 09/11/2015 09/11/2015  WBC 4.0 - 10.5 K/uL 17.7(H) - 9.2  Hemoglobin 12.0 - 15.0 g/dL 1.6(X9.6(L) - 8.7(L)  Hematocrit 36.0 - 46.0 % 27.7(L) - 27.1(L)  Platelets 150 - 400 K/uL 280 406(H) 389   Assessment: s/p Procedure(s): SUPRACERVICAL ABDONIMAL HYSTERECTOMY,  (N/A) BILATERAL SALPINGECTOMY: stable, progressing well and tolerating diet  Plan: Advance diet Encourage ambulation Advance to PO medication; Oxycodone 10 mg and Acetaminophen ordered (Perocet 10 is not on formulary) Discontinue IV fluids Routine postoperative care    LOS: 1 day    Derriona Branscom A, MD 09/12/2015, 7:52 AM

## 2015-09-12 NOTE — Addendum Note (Signed)
Addendum  created 09/12/15 0824 by Yolonda KidaAlison L Tymeshia Awan, CRNA   Modules edited: Notes Section   Notes Section:  File: [id number]

## 2015-09-12 NOTE — Anesthesia Postprocedure Evaluation (Signed)
  Anesthesia Post-op Note  Patient: Stacy Moss  Procedure(s) Performed: Procedure(s): SUPRACERVICAL ABDONIMAL HYSTERECTOMY,  (N/A) BILATERAL SALPINGECTOMY  Patient Location: [facility]  Anesthesia Type:General  Level of Consciousness: awake, alert , oriented and patient cooperative  Airway and Oxygen Therapy: Patient Spontanous Breathing and Patient connected to nasal cannula oxygen  Post-op Pain: none  Post-op Assessment: Post-op Vital signs reviewed, Patient's Cardiovascular Status Stable, Respiratory Function Stable, Patent Airway and No signs of Nausea or vomiting              Post-op Vital Signs: Reviewed and stable  Last Vitals:  Filed Vitals:   09/12/15 0100 09/12/15 0526  BP: 135/68 135/64  Pulse: 66 67  Temp: 37.1 C 36.6 C  Resp: 20 20    Complications: No apparent anesthesia complications

## 2015-09-13 ENCOUNTER — Encounter: Payer: Self-pay | Admitting: Obstetrics & Gynecology

## 2015-09-13 ENCOUNTER — Encounter (HOSPITAL_COMMUNITY): Payer: Self-pay | Admitting: Obstetrics & Gynecology

## 2015-09-13 LAB — CBC
HEMATOCRIT: 23.2 % — AB (ref 36.0–46.0)
Hemoglobin: 8 g/dL — ABNORMAL LOW (ref 12.0–15.0)
MCH: 30.5 pg (ref 26.0–34.0)
MCHC: 34.5 g/dL (ref 30.0–36.0)
MCV: 88.5 fL (ref 78.0–100.0)
PLATELETS: 236 10*3/uL (ref 150–400)
RBC: 2.62 MIL/uL — ABNORMAL LOW (ref 3.87–5.11)
RDW: 15.8 % — AB (ref 11.5–15.5)
WBC: 13.1 10*3/uL — AB (ref 4.0–10.5)

## 2015-09-13 MED ORDER — FERROUS SULFATE 325 (65 FE) MG PO TABS: 325.0000 mg | ORAL_TABLET | Freq: Three times a day (TID) | ORAL | Status: DC

## 2015-09-13 MED ORDER — DOCUSATE SODIUM 100 MG PO CAPS: 100.0000 mg | ORAL_CAPSULE | Freq: Two times a day (BID) | ORAL | Status: DC | PRN

## 2015-09-13 MED ORDER — IBUPROFEN 600 MG PO TABS: 600.0000 mg | ORAL_TABLET | Freq: Four times a day (QID) | ORAL | Status: DC | PRN

## 2015-09-13 MED ORDER — FERROUS SULFATE 325 (65 FE) MG PO TABS
325.0000 mg | ORAL_TABLET | Freq: Three times a day (TID) | ORAL | Status: DC
  Filled 2015-09-13: qty 1

## 2015-09-13 MED ORDER — OXYCODONE-ACETAMINOPHEN 10-325 MG PO TABS: 1.0000 | ORAL_TABLET | Freq: Four times a day (QID) | ORAL | Status: DC | PRN

## 2015-09-13 MED ORDER — DOCUSATE SODIUM 100 MG PO CAPS
100.0000 mg | ORAL_CAPSULE | Freq: Two times a day (BID) | ORAL | Status: DC | PRN
  Administered 2015-09-13: 100 mg via ORAL

## 2015-09-13 NOTE — Discharge Summary (Signed)
Gynecology Physician Postoperative Discharge Summary  Patient ID: Stacy Moss MRN: [id number] DOB/AGE: 1991/08/14 24 y.o.  Admit Date: 09/11/2015 Discharge Date: 09/13/2015  Preoperative Diagnoses: Recurrent episodes of postpartum hemorrhage since delivery on 08/11/15  Procedures: SUPRACERVICAL ABDOMINAL HYSTERECTOMY, BILATERAL SALPINGECTOMY   Hospital Course:  Stacy Moss is a 24 y.o. [id number] admitted for urgent hysterectomy for her third episode of delayed postpartum bleeding.Patient has had a complicated course over the last month.  She had an emergent cesarean section for uterine rupture on 08/11/15 complicated by significant blood loss requiring transfusion of 2 units of pRBCs. She was discharged on POD#2, but returned on POD#5 with another episode of postpartum hemorrhage. She received another 4 units of pRBCs and was discharged to home, but returned on POD#20 with another bleeding episode. This admission on 08/31/15 (POD#20) was complicated by anemia and associated shock that led to a code being called. Hysterectomy was recommended but patient declined, and underwent IR right uterine artery embolization. She received a total of six units of pRBCs during this admission. She was monitored until 09/04/15 and had no further significant bleeding, discharge hemoglobin stabilized at 8.1.On 09/11/15, she had another large episode of bleeding at home and presented to [facility]U via EMS.  Hysterectomy was recommended and she agreed with the plan.   She underwent the procedures as mentioned above. For further details about surgery, please refer to the operative report. Patient had an uncomplicated postoperative course; hemoglobin settled at 8.0 after equilibrating with her blood loss before and during surgery, and after receiving three additional units of pRBCs. Her anemia was asymptomatic, she was started on iron therapy.  By time of discharge on POD#2, her pain was controlled on oral pain  medications; she was ambulating, voiding without difficulty, tolerating regular diet and passing flatus. She was deemed stable for discharge to home.   Significant Labs: CBC Latest Ref Rng 09/13/2015 09/12/2015 09/11/2015  WBC 4.0 - 10.5 K/uL 13.1(H) 17.7(H) -  Hemoglobin 12.0 - 15.0 g/dL 8.0(L) 9.6(L) -  Hematocrit 36.0 - 46.0 % 23.2(L) 27.7(L) -  Platelets 150 - 400 K/uL 236 280 406(H)   Surgical Pathology: Uterus and bilateral fallopian tubes - ENDOMETRIUM: ABUNDANT FIBRIN DEPOSITION ASSOCIATED WITH HEMORRHAGE AND MILD CHRONIC INFLAMMATION. NO TROPHOBLASTIC DISEASE OR MALIGNANCY. - MYOMETRIUM: FOCAL INTRAVASCULAR FOREIGN MATERIAL WITH GIANT CELL REACTION. NO EVIDENCE OF MALIGNANCY. - UTERINE SEROSA: FIBROUS ADHESIONS ASSOCIATED WITH CHRONIC INFLAMMATION. NO ENDOMETRIOSIS OR MALIGNANCY. - RIGHT AND LEFT FALLOPIAN TUBES: ACUTE AND CHRONIC MUCOSAL SALPINGITIS. NO ENDOMETRIOSIS OR MALIGNANCY Microscopic Comment The endometrium shows extensive fibrin deposition with areas of necrosis and hemorrhage with only patchy areas of  intact endometrial glands and stroma. There is no evidence of gestational trophoblastic disease or malignancy. Focally within the myometrium there are vessels with foreign material focally associated with histiocytic infiltrates and foreign body reaction.  Discharge Exam: Blood pressure 126/49, pulse 80, temperature 98.1 F (36.7 C), temperature source Oral, resp. rate 18, height  (1.753 m), weight 215 lb (97.523 kg), SpO2 100 %, unknown if currently breastfeeding. General appearance: alert and no distress  Resp: clear to auscultation bilaterally  Cardio: regular rate and rhythm  GI: soft, non-tender; bowel sounds normal; no masses, no organomegaly.  Incision: C/D/I, no erythema, no drainage noted Pelvic: scant blood on pad  Extremities: extremities normal, atraumatic, no cyanosis or edema and Homans sign is negative, no sign of DVT   Discharged Condition:  Stable  Disposition: 01-Home or Self Care     Medication List  STOP taking these medications        oxyCODONE-acetaminophen 5-325 MG tablet  Commonly known as:  PERCOCET/ROXICET  Replaced by:  oxyCODONE-acetaminophen 10-325 MG tablet      TAKE these medications        docusate sodium 100 MG capsule  Commonly known as:  COLACE  Take 1 capsule (100 mg total) by mouth 2 (two) times daily as needed for mild constipation.     ferrous sulfate 325 (65 FE) MG tablet  Take 1 tablet (325 mg total) by mouth 3 (three) times daily with meals.     ibuprofen 600 MG tablet  Commonly known as:  ADVIL,MOTRIN  Take 1 tablet (600 mg total) by mouth every 6 (six) hours as needed (mild pain).     oxyCODONE-acetaminophen 10-325 MG tablet  Commonly known as:  PERCOCET  Take 1-2 tablets by mouth every 6 (six) hours as needed for pain.       Follow-up Information    [facility]   Why:  1:45pm for postpartum/postoperative appointment. Call clinic/come to MAU for any concerning issues.   Contact information:   [address] [place] [id number] [phone]      Signed:  Jaynie CollinsUGONNA  Pheonix Wisby, MD, FACOG Attending Obstetrician & Gynecologist Faculty Practice, [facility] [place]

## 2015-09-13 NOTE — Progress Notes (Signed)
2 Days Post-Op Procedure(s) (LRB): SUPRACERVICAL ABDOMINAL HYSTERECTOMY,  (N/A) BILATERAL SALPINGECTOMY for recurrent episodes of postpartum hemorrhage since delivery on 08/11/15  Subjective: Patient reports some incisional pain, tolerating PO and no problems voiding. No flatus yet. Ambulating. Desires discharge to home.   Desires Percocet 10s for pain, does not want Dilaudid due to sedating effects   Objective: I have reviewed patient's vital signs, intake and output, medications and labs. Temp:  [98.1 F (36.7 C)-99.3 F (37.4 C)] 98.1 F (36.7 C) (11/22 0557) Pulse Rate:  [74-97] 80 (11/22 0557) Resp:  [16-20] 18 (11/22 0557) BP: (126-151)/(49-94) 126/49 mmHg (11/22 0557) SpO2:  [97 %-100 %] 100 % (11/22 0557) Weight:  [215 lb (97.523 kg)] 215 lb (97.523 kg) (11/21 2112) 11/21 0701 - 11/22 0700 In: 1860.8 [P.O.:490; I.V.:1370.8] Out: 1100 [Urine:1100]  General: alert and no distress Resp: clear to auscultation bilaterally Cardio: regular rate and rhythm GI: soft, non-tender; bowel sounds normal; no masses,  no organomegaly and incision: clean, dry, intact and no drainage present Extremities: extremities normal, atraumatic, no cyanosis or edema, Homans sign is negative, no sign of DVT and no edema, redness or tenderness in the calves or thighs Vaginal Bleeding: minimal  CBC Latest Ref Rng 09/13/2015 09/12/2015 09/11/2015  WBC 4.0 - 10.5 K/uL 13.1(H) 17.7(H) -  Hemoglobin 12.0 - 15.0 g/dL 8.0(L) 9.6(L) -  Hematocrit 36.0 - 46.0 % 23.2(L) 27.7(L) -  Platelets 150 - 400 K/uL 236 280 406(H)   Assessment: s/p Procedure(s): SUPRACERVICAL ABDONIMAL HYSTERECTOMY,  (N/A) BILATERAL SALPINGECTOMY: stable, progressing well and tolerating diet  Plan: Continue regular diet Encourage ambulation Continue PO medication; Oxycodone 10 mg and Acetaminophen ordered (Perocet 10 will be ordered on discharge) Ferrous sulfate ordered for anemia; no signs of further active bleeding Will get  Dulcolax suppository, warm fruit juice to help with flatus Discharge to home pending flatus   LOS: 2 days    ANYANWU,UGONNA A, MD 09/13/2015, 8:36 AM

## 2015-09-13 NOTE — Discharge Instructions (Signed)
Supracervical Hysterectomy, Care After °Refer to this sheet in the next few weeks. These instructions provide you with information on caring for yourself after your procedure. Your health care provider may also give you more specific instructions. Your treatment has been planned according to current medical practices, but problems sometimes occur. Call your health care provider if you have any problems or questions after your procedure.  °WHAT TO EXPECT AFTER THE PROCEDURE °After your procedure, it is typical to have some discomfort, tenderness, swelling, and bruising at the surgical sites. This normally lasts for about 2 weeks.  °HOME CARE INSTRUCTIONS  °· Get plenty of rest and sleep. °· Only take over-the-counter or prescription medicines as directed by your health care provider. °· Do not take aspirin. It can cause bleeding. °· Do not drive until your health care provider approves. °· Follow your health care provider's advice regarding exercise, lifting, and general activities. °· Resume your usual diet as directed by your health care provider. °· Do not douche, use tampons, or have sexual intercourse for at least 6 weeks or until your health care provider gives you permission. °· Change your bandages (dressings) only as directed by your health care provider. °· Monitor your temperature. °· Take showers instead of baths for 2-3 weeks or as directed by your health care provider. °· Drink enough fluids to keep your urine clear or pale yellow. °· Do not drink alcohol until your health care provider gives you permission. °· If you are constipated, you may take a mild laxative if your health care provider approves. Bran foods may also help with constipation problems. °· Try to have someone home with you for 1-2 weeks to help with activities. °· Follow up with your health care provider as directed. °SEEK MEDICAL CARE IF: °· You have swelling, redness, or increasing pain in the incision area. °· You have pus coming  from an incision. °· You notice a bad smell coming from the incision or dressing. °· You have swelling, redness, or pain in the area around the IV site. °· Your incision breaks open. °· You feel dizzy or lightheaded. °· You have pain or bleeding when you urinate. °· You have persistent diarrhea. °· You have persistent nausea and vomiting. °· You have abnormal vaginal discharge. °· You have a rash. °· Your pain is not controlled with your prescribed medicine. °SEEK IMMEDIATE MEDICAL CARE IF: °· You have a fever. °· You have severe abdominal pain. °· You have chest pain. °· You have shortness of breath. °· You faint. °· You have pain, swelling, or redness in your leg. °· You have heavy vaginal bleeding with blood clots. °  °This information is not intended to replace advice given to you by your health care provider. Make sure you discuss any questions you have with your health care provider. °  °Document Released: 07/29/2013 Document Reviewed: 07/29/2013 °Elsevier Interactive Patient Education ©2016 [employer] ° °

## 2015-09-14 ENCOUNTER — Ambulatory Visit: Payer: Medicaid Other | Admitting: Obstetrics and Gynecology

## 2015-09-15 LAB — TYPE AND SCREEN
ABO/RH(D): AB POS
ANTIBODY SCREEN: NEGATIVE
UNIT DIVISION: 0
UNIT DIVISION: 0
Unit division: 0
Unit division: 0

## 2015-09-16 NOTE — Telephone Encounter (Signed)
[facility] See [facility] notes. Nothing to add.

## 2015-09-20 ENCOUNTER — Inpatient Hospital Stay (HOSPITAL_COMMUNITY)
Admission: AD | Admit: 2015-09-20 | Discharge: 2015-09-20 | Disposition: A | Discharge status: Home / Self Care | Payer: Medicaid Other | Source: Ambulatory Visit | Attending: Obstetrics & Gynecology | Admitting: Obstetrics & Gynecology

## 2015-09-20 ENCOUNTER — Encounter (HOSPITAL_COMMUNITY): Payer: Self-pay | Admitting: Obstetrics & Gynecology

## 2015-09-20 DIAGNOSIS — R109 Unspecified abdominal pain: Secondary | ICD-10-CM | POA: Diagnosis present

## 2015-09-20 DIAGNOSIS — F17210 Nicotine dependence, cigarettes, uncomplicated: Secondary | ICD-10-CM | POA: Diagnosis not present

## 2015-09-20 DIAGNOSIS — G8918 Other acute postprocedural pain: Secondary | ICD-10-CM | POA: Diagnosis not present

## 2015-09-20 DIAGNOSIS — K5903 Drug induced constipation: Secondary | ICD-10-CM

## 2015-09-20 DIAGNOSIS — T814XXA Infection following a procedure, initial encounter: Secondary | ICD-10-CM | POA: Diagnosis not present

## 2015-09-20 DIAGNOSIS — T8140XA Infection following a procedure, unspecified, initial encounter: Secondary | ICD-10-CM

## 2015-09-20 DIAGNOSIS — Z90711 Acquired absence of uterus with remaining cervical stump: Secondary | ICD-10-CM | POA: Diagnosis present

## 2015-09-20 DIAGNOSIS — K5900 Constipation, unspecified: Secondary | ICD-10-CM | POA: Diagnosis not present

## 2015-09-20 DIAGNOSIS — T402X5A Adverse effect of other opioids, initial encounter: Secondary | ICD-10-CM

## 2015-09-20 HISTORY — DX: Acquired absence of uterus with remaining cervical stump: Z90.711

## 2015-09-20 MED ORDER — SULFAMETHOXAZOLE-TRIMETHOPRIM 800-160 MG PO TABS: 1.0000 | ORAL_TABLET | Freq: Two times a day (BID) | ORAL | Status: DC

## 2015-09-20 MED ORDER — MAGNESIUM HYDROXIDE 400 MG/5ML PO SUSP: 30.0000 mL | Freq: Every day | ORAL | Status: DC | PRN

## 2015-09-20 MED ORDER — BISACODYL 5 MG PO TBEC: 5.0000 mg | DELAYED_RELEASE_TABLET | Freq: Two times a day (BID) | ORAL | Status: DC | PRN

## 2015-09-20 MED ORDER — METOCLOPRAMIDE HCL 10 MG PO TABS: 10.0000 mg | ORAL_TABLET | Freq: Four times a day (QID) | ORAL | Status: DC | PRN

## 2015-09-20 MED ORDER — HYDROMORPHONE HCL 2 MG PO TABS: 2.0000 mg | ORAL_TABLET | ORAL | Status: DC | PRN

## 2015-09-20 MED ORDER — DOCUSATE SODIUM 100 MG PO CAPS: 200.0000 mg | ORAL_CAPSULE | Freq: Three times a day (TID) | ORAL | Status: DC | PRN

## 2015-09-20 MED ORDER — METRONIDAZOLE 500 MG PO TABS: 500.0000 mg | ORAL_TABLET | Freq: Two times a day (BID) | ORAL | Status: AC

## 2015-09-20 NOTE — Progress Notes (Signed)
Spec exam only 

## 2015-09-20 NOTE — MAU Note (Signed)
Dr Sarina IllANyanuw removed old steristrips and used silver nitate on one area. New steristips applied. Incision clean and dry without redness.

## 2015-09-20 NOTE — MAU Note (Addendum)
Had abd hyst 11/20. Having tan watery d/c since Monday. Having some back pain, lower back pain, rectal pain. Has a lot of rectal pain when has BM for past couple days. Taking stool softners
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
## 2015-09-20 NOTE — Discharge Instructions (Signed)
Preventing Constipation After Surgery Constipation is when a person has fewer than 3 bowel movements a week; has difficulty having a bowel movement; or has stools that are dry, hard, or larger than normal. Many things can make constipation likely after surgery. They include:  Medicines, especially numbing medicines (anesthetics) and very strong pain medicines called narcotics.  Feeling stressed because of the surgery.  Eating different foods than normal.  Being less active. Symptoms of constipation include:  Having fewer than 3 bowel movements a week.  Straining to have a bowel movement.  Having hard, dry, or larger-than-normal stools.  Feeling full or bloated.  Having pain in the lower abdomen.  Not feeling relief after having a bowel movement. HOME CARE INSTRUCTIONS  Diet  Eat foods that have a lot of fiber. These include fruits, vegetables, whole grains, and beans. Limit foods high in fat and processed sugars. These include french fries, hamburgers, cookies, and candy.  Take a fiber supplement as directed. If you are not taking a fiber supplement and think that you are not getting enough fiber from foods, talk to your health care provider about adding a fiber supplement to your diet.  Drink clear fluids, especially water. Avoid drinking alcohol, caffeine, and soda. These can make constipation worse.  Drink enough fluids to keep your urine clear or pale yellow. Activity   After surgery, return to your normal activities slowly or when your health care provider says it is okay.  Start walking as soon as you can. Try to go a little farther each day.  Once your health care provider approves, do some sort of regular exercise. This helps prevent constipation. Bowel Movements  Go to the restroom when you have the urge to go. Do not hold it in.  Try drinking something hot to get a bowel movement started.  Keep track of how often you use the restroom. If you miss 2-3 bowel  movements, talk to your health care provider about medicines that prevent constipation. Your health care provider may suggest a stool softener, laxative, or fiber supplement.  Only take over-the-counter or prescription medicines as directed by your health care provider.  Do not take other medicines without talking to your health care provider first. If you become constipated and take a medicine to make you have a bowel movement, the problem may get worse. Other kinds of medicine can also make the problem worse. SEEK MEDICAL CARE IF:  You used stool softeners or laxatives and still have not had a bowel movement within 24-48 hours after using them.  You have not had a bowel movement in 3 days. SEEK IMMEDIATE MEDICAL CARE IF:   Your constipation lasts for more than 4 days or gets worse.  You have bright red blood in your stool.  You have abdominal or rectal pain.  You have very bad cramping.  You have thin, pencil-like stools.  You have unexplained weight loss.  You have a fever or persistent symptoms for more than 2-3 days.  You have a fever and your symptoms suddenly get worse.   This information is not intended to replace advice given to you by your health care provider. Make sure you discuss any questions you have with your health care provider.   Document Released: 02/02/2013 Document Revised: 10/29/2014 Document Reviewed: 02/02/2013 Elsevier Interactive Patient Education [employer]2016 Elsevier Inc.   Constipation, Adult Constipation is when a person has fewer than three bowel movements a week, has difficulty having a bowel movement, or has stools that  are dry, hard, or larger than normal. As people grow older, constipation is more common. A low-fiber diet, not taking in enough fluids, and taking certain medicines may make constipation worse.  CAUSES   Certain medicines, such as antidepressants, pain medicine, iron supplements, antacids, and water pills.   Certain diseases, such as  diabetes, irritable bowel syndrome (IBS), thyroid disease, or depression.   Not drinking enough water.   Not eating enough fiber-rich foods.   Stress or travel.   Lack of physical activity or exercise.   Ignoring the urge to have a bowel movement.   Using laxatives too much.  SIGNS AND SYMPTOMS   Having fewer than three bowel movements a week.   Straining to have a bowel movement.   Having stools that are hard, dry, or larger than normal.   Feeling full or bloated.   Pain in the lower abdomen.   Not feeling relief after having a bowel movement.  DIAGNOSIS  Your health care provider will take a medical history and perform a physical exam. Further testing may be done for severe constipation. Some tests may include:  A barium enema X-ray to examine your rectum, colon, and, sometimes, your small intestine.   A sigmoidoscopy to examine your lower colon.   A colonoscopy to examine your entire colon. TREATMENT  Treatment will depend on the severity of your constipation and what is causing it. Some dietary treatments include drinking more fluids and eating more fiber-rich foods. Lifestyle treatments may include regular exercise. If these diet and lifestyle recommendations do not help, your health care provider may recommend taking over-the-counter laxative medicines to help you have bowel movements. Prescription medicines may be prescribed if over-the-counter medicines do not work.  HOME CARE INSTRUCTIONS   Eat foods that have a lot of fiber, such as fruits, vegetables, whole grains, and beans.  Limit foods high in fat and processed sugars, such as french fries, hamburgers, cookies, candies, and soda.   A fiber supplement may be added to your diet if you cannot get enough fiber from foods.   Drink enough fluids to keep your urine clear or pale yellow.   Exercise regularly or as directed by your health care provider.   Go to the restroom when you have the  urge to go. Do not hold it.   Only take over-the-counter or prescription medicines as directed by your health care provider. Do not take other medicines for constipation without talking to your health care provider first.  SEEK IMMEDIATE MEDICAL CARE IF:   You have bright red blood in your stool.   Your constipation lasts for more than 4 days or gets worse.   You have abdominal or rectal pain.   You have thin, pencil-like stools.   You have unexplained weight loss. MAKE SURE YOU:   Understand these instructions.  Will watch your condition.  Will get help right away if you are not doing well or get worse.   This information is not intended to replace advice given to you by your health care provider. Make sure you discuss any questions you have with your health care provider.   Document Released: 07/06/2004 Document Revised: 10/29/2014 Document Reviewed: 07/20/2013 Elsevier Interactive Patient Education [employer]

## 2015-09-21 ENCOUNTER — Ambulatory Visit: Payer: Medicaid Other | Admitting: Obstetrics and Gynecology

## 2015-09-21 ENCOUNTER — Inpatient Hospital Stay (HOSPITAL_COMMUNITY)
Admission: AD | Admit: 2015-09-21 | Discharge: 2015-09-21 | Disposition: A | Discharge status: Home / Self Care | Payer: Medicaid Other | Source: Ambulatory Visit | Attending: Obstetrics & Gynecology | Admitting: Obstetrics & Gynecology

## 2015-09-21 DIAGNOSIS — Z539 Procedure and treatment not carried out, unspecified reason: Secondary | ICD-10-CM | POA: Diagnosis not present

## 2015-09-21 DIAGNOSIS — R109 Unspecified abdominal pain: Secondary | ICD-10-CM | POA: Diagnosis not present

## 2015-09-21 LAB — URINALYSIS, ROUTINE W REFLEX MICROSCOPIC
BILIRUBIN URINE: NEGATIVE
Glucose, UA: NEGATIVE mg/dL
Hgb urine dipstick: NEGATIVE
KETONES UR: 15 mg/dL — AB
LEUKOCYTES UA: NEGATIVE
NITRITE: NEGATIVE
PROTEIN: NEGATIVE mg/dL
Specific Gravity, Urine: 1.025 (ref 1.005–1.030)
pH: 6 (ref 5.0–8.0)

## 2015-09-21 NOTE — MAU Note (Signed)
Pt not in lobby.  

## 2015-09-21 NOTE — MAU Note (Signed)
Pt had hysterectomy on 09/11/2015. Pt states that wound is open. C/o abdominal pain and sharp back pain. Has been evaluated and states that pain is much worse. Given rx for dilaudid-last took about 6 hours ago-states that it did not help with pain, just makes her sleepy.

## 2015-09-28 ENCOUNTER — Ambulatory Visit (INDEPENDENT_AMBULATORY_CARE_PROVIDER_SITE_OTHER): Payer: Medicaid Other | Admitting: Obstetrics & Gynecology

## 2015-09-28 ENCOUNTER — Encounter: Payer: Self-pay | Admitting: Obstetrics & Gynecology

## 2015-09-28 VITALS — BP 141/84 | HR 82 | Ht 68.0 in | Wt 207.2 lb

## 2015-09-28 DIAGNOSIS — Z98891 History of uterine scar from previous surgery: Secondary | ICD-10-CM

## 2015-09-28 DIAGNOSIS — I10 Essential (primary) hypertension: Secondary | ICD-10-CM

## 2015-09-28 DIAGNOSIS — Z90711 Acquired absence of uterus with remaining cervical stump: Secondary | ICD-10-CM

## 2015-09-28 DIAGNOSIS — F329 Major depressive disorder, single episode, unspecified: Secondary | ICD-10-CM

## 2015-09-28 DIAGNOSIS — F32A Depression, unspecified: Secondary | ICD-10-CM

## 2015-09-28 MED ORDER — HYDROCHLOROTHIAZIDE 25 MG PO TABS: 25.0000 mg | ORAL_TABLET | Freq: Every day | ORAL | Status: DC

## 2015-09-28 MED ORDER — QUETIAPINE FUMARATE 200 MG PO TABS: 200.0000 mg | ORAL_TABLET | Freq: Every day | ORAL | Status: DC

## 2015-09-28 NOTE — Patient Instructions (Signed)
Return to clinic for any scheduled appointments or for any gynecologic concerns as needed.   

## 2015-09-28 NOTE — Progress Notes (Signed)
Subjective:     Stacy Moss is a 24 y.o. [phone] female who presents for a postpartum visit. She is 6 weeks postpartum following a emergent low transverse cesarean section  at [redacted] weeks gestational weeksfor uterine rupture. She had a very complicated intrapartum and postpartum course.   She had an emergent cesarean section for uterine rupture on 08/11/15 complicated by significant blood loss requiring transfusion of 2 units of pRBCs. She was discharged on POD#2, but returned on POD#5 with another episode of postpartum hemorrhage. She received another 4 units of pRBCs and was discharged to home, but returned on POD#20 with another bleeding episode. This admission on 08/31/15 (POD#20) was complicated by anemia and associated shock that led to a code being called. Hysterectomy was recommended but patient declined, and underwent IR right uterine artery embolization. She received a total of six units of pRBCs during this admission. She was monitored until 09/04/15 and had no further significant bleeding, discharge hemoglobin stabilized at 8.1.On 09/11/15, she had another large episode of bleeding at home and presented to MAU via EMS. She then underwent supracervical abdominal hysterectomy, bilateral salpingectomy and was discharged to home on POD#2.  She was seen in MAU on 09/19/2016 and was treated for incisional infection.  Since this visit, her symptoms have been controlled on  medications.  Baby is feeding by bottle - Similac Advance. Bleeding no bleeding. Bowel function is abnormal: painful bowel movements due to constipation. Bladder function is abnormal: painful urination since surgery. Patient is sexually active. Contraception method is status post hysterectomy. Postpartum depression screening: positive at 10, has a history of depression, No SI/HI. Desires Seroquel.  Also desires medication for cHTN.  The following portions of the patient's history were reviewed and updated as appropriate:  allergies, current medications, past family history, past medical history, past social history, past surgical history and problem list.  Normal pap smear 03/09/2015.  Review of Systems Pertinent items noted in HPI and remainder of comprehensive ROS otherwise negative.   Objective:    BP 141/84 mmHg  Pulse 82  Ht 5\' 8"  (1.727 m)  Wt 207 lb 3.2 oz (93.985 kg)  BMI 31.51 kg/m2  LMP 10/22/2014 (Approximate)  General:  alert and no distress   Breasts:  negative  Lungs: clear to auscultation bilaterally  Heart:  regular rate and rhythm  Abdomen: soft, non-tender; bowel sounds normal; no masses,  no organomegaly and incision C/D/I and 2 mm area on left side on incision has superficial separation, no drainage        Assessment:   Normal postoperative/postpartum exam. Pap smear not done at today's visit.   Plan:   1. Contraception: status post hysterectomy  2. Seroquel prescribed, will follow up with mental health provider 3. HCTZ given for history of HTN 4. Follow up or as needed.    Jaynie CollinsUGONNA  Tylisha Danis, MD, FACOG Attending Obstetrician & Gynecologist, [facility]

## 2015-10-09 NOTE — Discharge Summary (Signed)
Physician Discharge Summary  Patient ID: Stacy NashDesiree M Coster MRN: [id number] DOB/AGE: 22/02/1991 22 y.o.  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 I had several in depth conversations with pt and she understands if she were to bleed again no further conservative measures are reasonable.  Consults: IR  Significant Diagnostic Studies: labs:  and CT imaging  Treatments: transfusions, multiple, IR embolization  Discharge Exam: Blood pressure 148/88, pulse 88, temperature 98.1 F (36.7 C), temperature source Oral, resp. rate 18, height 5\' 9"  (1.753 m), weight 219 lb (99.338 kg), SpO2 100 %, unknown if currently breastfeeding. General appearance: alert, cooperative and no distress Cardio: regular rate and rhythm, S1, S2 normal, no murmur, click, rub or gallop Incision/Wound:soft benign intact no infection  Disposition: 01-Home or Self Care  Discharge Instructions    Diet - low sodium heart healthy    Complete by:  As directed      Discharge wound care:    Complete by:  As directed   Keep clean and dry     Driving Restrictions    Complete by:  As directed   No restrictions     Increase activity slowly    Complete by:  As directed      Lifting restrictions    Complete by:  As directed   No lifting more than 10 pounds     Sexual Activity Restrictions    Complete by:  As directed   No sex            Medication List    STOP  taking these medications        amoxicillin-clavulanate 875-125 MG tablet  Commonly known as:  AUGMENTIN     ibuprofen 600 MG tablet  Commonly known as:  ADVIL,MOTRIN     oxyCODONE-acetaminophen 5-325 MG tablet  Commonly known as:  PERCOCET/ROXICET           Follow-up Information    [facility].   Why:  post op visit   Contact information:   [address] [place] [phone]      Signed: Lazaro ArmsURE,Daryn Pisani H

## 2015-10-11 ENCOUNTER — Inpatient Hospital Stay (HOSPITAL_COMMUNITY)
Admission: AD | Admit: 2015-10-11 | Discharge: 2015-10-12 | Disposition: A | Discharge status: Home / Self Care | Payer: Medicaid Other | Source: Ambulatory Visit | Attending: Obstetrics & Gynecology | Admitting: Obstetrics & Gynecology

## 2015-10-11 ENCOUNTER — Encounter (HOSPITAL_COMMUNITY): Payer: Self-pay | Admitting: *Deleted

## 2015-10-11 ENCOUNTER — Inpatient Hospital Stay (HOSPITAL_COMMUNITY): Payer: Medicaid Other

## 2015-10-11 DIAGNOSIS — F319 Bipolar disorder, unspecified: Secondary | ICD-10-CM | POA: Diagnosis not present

## 2015-10-11 DIAGNOSIS — L03311 Cellulitis of abdominal wall: Secondary | ICD-10-CM | POA: Diagnosis not present

## 2015-10-11 DIAGNOSIS — F17210 Nicotine dependence, cigarettes, uncomplicated: Secondary | ICD-10-CM | POA: Diagnosis not present

## 2015-10-11 DIAGNOSIS — I10 Essential (primary) hypertension: Secondary | ICD-10-CM | POA: Insufficient documentation

## 2015-10-11 DIAGNOSIS — G8918 Other acute postprocedural pain: Secondary | ICD-10-CM | POA: Diagnosis present

## 2015-10-11 LAB — URINALYSIS, ROUTINE W REFLEX MICROSCOPIC
Glucose, UA: NEGATIVE mg/dL
Ketones, ur: 15 mg/dL — AB
Nitrite: NEGATIVE
PH: 6 (ref 5.0–8.0)
Protein, ur: 30 mg/dL — AB

## 2015-10-11 LAB — TYPE AND SCREEN
ABO/RH(D): AB POS
ANTIBODY SCREEN: NEGATIVE

## 2015-10-11 LAB — CBC WITH DIFFERENTIAL/PLATELET
BASOS PCT: 0 %
Basophils Absolute: 0 10*3/uL (ref 0.0–0.1)
EOS ABS: 0.1 10*3/uL (ref 0.0–0.7)
Eosinophils Relative: 1 %
HCT: 28.3 % — ABNORMAL LOW (ref 36.0–46.0)
HEMOGLOBIN: 8.9 g/dL — AB (ref 12.0–15.0)
Lymphocytes Relative: 16 %
Lymphs Abs: 1.8 10*3/uL (ref 0.7–4.0)
MCH: 26.7 pg (ref 26.0–34.0)
MCHC: 31.4 g/dL (ref 30.0–36.0)
MCV: 85 fL (ref 78.0–100.0)
MONO ABS: 0.9 10*3/uL (ref 0.1–1.0)
MONOS PCT: 8 %
NEUTROS PCT: 75 %
Neutro Abs: 8.7 10*3/uL — ABNORMAL HIGH (ref 1.7–7.7)
Platelets: 324 10*3/uL (ref 150–400)
RBC: 3.33 MIL/uL — ABNORMAL LOW (ref 3.87–5.11)
RDW: 15.2 % (ref 11.5–15.5)
WBC: 11.5 10*3/uL — ABNORMAL HIGH (ref 4.0–10.5)

## 2015-10-11 LAB — URINE MICROSCOPIC-ADD ON: RBC / HPF: NONE SEEN RBC/hpf (ref 0–5)

## 2015-10-11 MED ORDER — HYDROMORPHONE HCL 1 MG/ML IJ SOLN
1.0000 mg | Freq: Once | INTRAMUSCULAR | Status: AC
  Administered 2015-10-11: 1 mg via INTRAVENOUS
  Filled 2015-10-11: qty 1

## 2015-10-11 MED ORDER — SODIUM CHLORIDE 0.9 % IV SOLN
INTRAVENOUS | Status: AC
  Administered 2015-10-11: 22:00:00 via INTRAVENOUS

## 2015-10-11 MED ORDER — IOHEXOL 300 MG/ML  SOLN
50.0000 mL | INTRAMUSCULAR | Status: AC
  Administered 2015-10-11: 50 mL via ORAL

## 2015-10-11 MED ORDER — IOHEXOL 300 MG/ML  SOLN
100.0000 mL | Freq: Once | INTRAMUSCULAR | Status: AC | PRN
  Administered 2015-10-11: 100 mL via INTRAVENOUS

## 2015-10-11 NOTE — MAU Provider Note (Signed)
History     CSN: [id number]  Arrival date and time: 10/11/15 7829   First Provider Initiated Contact with Patient 10/11/15 2050      No chief complaint on file.  HPI  Stacy Moss is a 24 y.o. [phone] who presents to MAU today with complaint of pain and redness around her incision site. The patient had an emergency C/S for uterine rupture on 08/11/15. She then had numerous return visits to MAU and admissions for blood transfusions due to delayed PP hemorrhage. 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 She returned to MAU with another episode of bleeding and had a supracervical abdominal hysterectomy and BSO on 09/11/15. She states that she did well post operatively until about 1 week ago when pain started around her incision. She also noted redness and swelling of the mons pubis. She has been taking Tylenol and Ibuprofen with minimal relief. She states that she did have Percocet and Dilaudid for pain post op but ran out of Percocet and threw away Dilaudid because it didn't work. She denies fever. She has had watery stool since her surgery and noted some vaginal discharge. She denies recent N/V.   OB History    Gravida Para Term Preterm AB TAB SAB Ectopic Multiple Living   5 3 3  0 2 0 2 0 0 3      Past Medical History  Diagnosis Date  . Hx of tonsillectomy   . S/P cesarean section 05/25/2012  . Seizures [facility])     age [age]- after fell and hit head  . Hypertension     on meds 2011  . Anxiety   . Mental disorder   . Depression   . Bipolar affective (HCC)   . ADD (attention deficit disorder)   . Polysubstance abuse 10/2014    Ectasy use  . History of marijuana use   . History of cocaine use 10/2014  . History of suicidal ideation   . Smoker   . History of VBAC   . History of gestational diabetes mellitus (GDM)     with prior pregnancy  . Hx of eye surgery 02/2015  . S/p supracervical hysterectomy  and bilateral salpingectomy on 09/11/15 09/11/2015    Past Surgical History  Procedure Laterality Date  . Tonsillectomy    . Cesarean section  05/25/2012    Procedure: CESAREAN SECTION;  Surgeon: Sherron Monday, MD;  Location: [facility] ORS;  Service: Gynecology;  Laterality: N/A;  Primary cesarean section with delivery of baby girl at 0700. Apgars9/9.  Marland Kitchen Orif orbital fracture Left 02/25/2015    Procedure: OPEN REDUCTION INTERNAL FIXATION (ORIF) LEFT ORBITAL FRACTURE;  Surgeon: Melvenia Beam, MD;  Location: [facility] OR;  Service: ENT;  Laterality: Left;  . Cesarean section N/A 08/11/2015    Procedure: CESAREAN SECTION;  Surgeon: Lazaro Arms, MD;  Location: [facility] ORS;  Service: Obstetrics;  Laterality: N/A;  . Abdominal hysterectomy N/A 09/11/2015    Procedure: SUPRACERVICAL ABDONIMAL HYSTERECTOMY, ;  Surgeon: Tereso Newcomer, MD;  Location: [facility] ORS;  Service: Gynecology;  Laterality: N/A;  . Bilateral salpingectomy  09/11/2015    Procedure: BILATERAL SALPINGECTOMY;  Surgeon: Tereso Newcomer, MD;  Location: [facility] ORS;  Service: Gynecology;;    Family History  Problem Relation Age of Onset  . Anesthesia problems Neg Hx   . Cancer Maternal Grandmother   . Hypertension Maternal Grandmother   . Hypertension Mother  Social History  Substance Use Topics  . Smoking status: Current Every Day Smoker -- 0.25 packs/day for 10 years    Types: Cigarettes  . Smokeless tobacco: Never Used  . Alcohol Use: No     Comment: Former EtOH abuse    Allergies: No Known Allergies  Prescriptions prior to admission  Medication Sig Dispense Refill Last Dose  . acetaminophen (TYLENOL) 500 MG tablet Take 1,000 mg by mouth every 6 (six) hours as needed for moderate pain.   10/11/2015 at Unknown time  . bisacodyl (DULCOLAX) 5 MG EC tablet Take 1 tablet (5 mg total) by mouth 2 (two) times daily as needed for moderate constipation. 30 tablet 0 Past Month at Unknown time  . docusate sodium (COLACE) 100 MG capsule Take 2 capsules  (200 mg total) by mouth 3 (three) times daily as needed for mild constipation. 90 capsule 2 Past Month at Unknown time  . hydrochlorothiazide (HYDRODIURIL) 25 MG tablet Take 1 tablet (25 mg total) by mouth daily. 30 tablet 3 10/11/2015 at Unknown time  . ibuprofen (ADVIL,MOTRIN) 600 MG tablet Take 1 tablet (600 mg total) by mouth every 6 (six) hours as needed (mild pain). 60 tablet 2 10/11/2015 at Unknown time  . QUEtiapine (SEROQUEL) 200 MG tablet Take 1 tablet (200 mg total) by mouth at bedtime. 30 tablet 8 Past Week at Unknown time  . ferrous sulfate 325 (65 FE) MG tablet Take 1 tablet (325 mg total) by mouth 3 (three) times daily with meals. (Patient not taking: Reported on 10/11/2015) 90 tablet 3   . HYDROmorphone (DILAUDID) 2 MG tablet Take 1-2 tablets (2-4 mg total) by mouth every 4 (four) hours as needed for severe pain. (Patient not taking: Reported on 10/11/2015) 60 tablet 0 Not Taking at Unknown time  . magnesium hydroxide (MILK OF MAGNESIA) 400 MG/5ML suspension Take 30 mLs by mouth daily as needed for moderate constipation. (Patient not taking: Reported on 10/11/2015) 360 mL 3 Not Taking at Unknown time  . metoCLOPramide (REGLAN) 10 MG tablet Take 1 tablet (10 mg total) by mouth 4 (four) times daily as needed for nausea or vomiting. (Patient not taking: Reported on 10/11/2015) 30 tablet 2 Not Taking at Unknown time  . sulfamethoxazole-trimethoprim (BACTRIM DS,SEPTRA DS) 800-160 MG tablet Take 1 tablet by mouth 2 (two) times daily. (Patient not taking: Reported on 10/11/2015) 28 tablet 1     Review of Systems  Constitutional: Negative for fever and malaise/fatigue.  Gastrointestinal: Positive for abdominal pain and diarrhea. Negative for nausea, vomiting and constipation.  Genitourinary: Negative for dysuria, urgency and frequency.       Neg - vaginal bleeding + vaginal discharge   Physical Exam   Blood pressure 148/88, pulse 105, temperature 98.4 F (36.9 C), temperature source  Oral, resp. rate 20, height  (1.702 m), weight 207 lb 2 oz (93.951 kg), last menstrual period 10/22/2014, not currently breastfeeding.  Physical Exam  Nursing note and vitals reviewed. Constitutional: She is oriented to person, place, and time. She appears well-developed and well-nourished. No distress.  HENT:  Head: Normocephalic and atraumatic.  Cardiovascular: Normal rate.   Respiratory: Effort normal.  GI: Soft. She exhibits no distension and no mass. There is no tenderness. There is no rebound and no guarding.  Neurological: She is alert and oriented to person, place, and time.  Skin: Skin is warm and dry. No erythema.     Psychiatric: She has a normal mood and affect.   Results for orders placed or  performed during the hospital encounter of 10/11/15 (from the past 24 hour(s))  Urinalysis, Routine w reflex microscopic (not at [facility])     Status: Abnormal   Collection Time: 10/11/15  8:32 PM  Result Value Ref Range   Color, Urine YELLOW YELLOW   APPearance CLEAR CLEAR   Specific Gravity, Urine >1.030 (H) 1.005 - 1.030   pH 6.0 5.0 - 8.0   Glucose, UA NEGATIVE NEGATIVE mg/dL   Hgb urine dipstick SMALL (A) NEGATIVE   Bilirubin Urine SMALL (A) NEGATIVE   Ketones, ur 15 (A) NEGATIVE mg/dL   Protein, ur 30 (A) NEGATIVE mg/dL   Nitrite NEGATIVE NEGATIVE   Leukocytes, UA SMALL (A) NEGATIVE  Urine microscopic-add on     Status: Abnormal   Collection Time: 10/11/15  8:32 PM  Result Value Ref Range   Squamous Epithelial / LPF 0-5 (A) NONE SEEN   WBC, UA 6-30 0 - 5 WBC/hpf   RBC / HPF NONE SEEN 0 - 5 RBC/hpf   Bacteria, UA RARE (A) NONE SEEN   Casts HYALINE CASTS (A) NEGATIVE   Urine-Other MUCOUS PRESENT   Urine rapid drug screen (hosp performed)     Status: Abnormal   Collection Time: 10/11/15  8:32 PM  Result Value Ref Range   Opiates NONE DETECTED NONE DETECTED   Cocaine NONE DETECTED NONE DETECTED   Benzodiazepines NONE DETECTED NONE DETECTED   Amphetamines NONE DETECTED  NONE DETECTED   Tetrahydrocannabinol POSITIVE (A) NONE DETECTED   Barbiturates NONE DETECTED NONE DETECTED  CBC with Differential/Platelet     Status: Abnormal   Collection Time: 10/11/15  9:45 PM  Result Value Ref Range   WBC 11.5 (H) 4.0 - 10.5 K/uL   RBC 3.33 (L) 3.87 - 5.11 MIL/uL   Hemoglobin 8.9 (L) 12.0 - 15.0 g/dL   HCT 81.128.3 (L) 91.436.0 - 78.246.0 %   MCV 85.0 78.0 - 100.0 fL   MCH 26.7 26.0 - 34.0 pg   MCHC 31.4 30.0 - 36.0 g/dL   RDW 95.615.2 21.311.5 - 08.615.5 %   Platelets 324 150 - 400 K/uL   Neutrophils Relative % 75 %   Neutro Abs 8.7 (H) 1.7 - 7.7 K/uL   Lymphocytes Relative 16 %   Lymphs Abs 1.8 0.7 - 4.0 K/uL   Monocytes Relative 8 %   Monocytes Absolute 0.9 0.1 - 1.0 K/uL   Eosinophils Relative 1 %   Eosinophils Absolute 0.1 0.0 - 0.7 K/uL   Basophils Relative 0 %   Basophils Absolute 0.0 0.0 - 0.1 K/uL  Type and screen [facility]     Status: None   Collection Time: 10/11/15  9:45 PM  Result Value Ref Range   ABO/RH(D) AB POS    Antibody Screen NEG    Sample Expiration 10/14/2015   Comprehensive metabolic panel     Status: Abnormal   Collection Time: 10/11/15  9:45 PM  Result Value Ref Range   Sodium 140 135 - 145 mmol/L   Potassium 3.8 3.5 - 5.1 mmol/L   Chloride 105 101 - 111 mmol/L   CO2 24 22 - 32 mmol/L   Glucose, Bld 72 65 - 99 mg/dL   BUN 13 6 - 20 mg/dL   Creatinine, Ser 5.780.68 0.44 - 1.00 mg/dL   Calcium 9.6 8.9 - 46.910.3 mg/dL   Total Protein 8.3 (H) 6.5 - 8.1 g/dL   Albumin 4.2 3.5 - 5.0 g/dL   AST 13 (L) 15 - 41 U/L  ALT 9 (L) 14 - 54 U/L   Alkaline Phosphatase 58 38 - 126 U/L   Total Bilirubin 0.9 0.3 - 1.2 mg/dL   GFR calc non Af Amer >60 >60 mL/min   GFR calc Af Amer >60 >60 mL/min   Anion gap 11 5 - 15   Ct Abdomen Pelvis W Contrast  10/11/2015  CLINICAL DATA:  Cesarean section 08/11/2015 and hysterectomy 09/11/2015. Incisional pain. EXAM: CT ABDOMEN AND PELVIS WITH CONTRAST TECHNIQUE: Multidetector CT imaging of the abdomen and  pelvis was performed using the standard protocol following bolus administration of intravenous contrast. CONTRAST:  OMNIPAQUE IOHEXOL 300 MG/ML  SOLN COMPARISON:  08/28/2015 FINDINGS: Lower chest and abdominal wall: Cesarean/hysterectomy incision site has a thicker appearance than suspected, with subcutaneous reticulation extending to the skin surface. No fluid collection or measurable hematoma. Hepatobiliary: There is new amorphous low-attenuation bridging segments 7 and 8, with roughly linear morphology and up to 3 cm maximal diameter. This area is is not covered on delayed phase. No abnormality seen in this region on previous CT. No evidence of biliary obstruction or stone. Pancreas: Unremarkable. Spleen: Unremarkable. Adrenals/Urinary Tract: Negative adrenals. No hydronephrosis or stone. Indistinct appearance the bladder, likely postoperative. Reproductive:Interval hysterectomy. Symmetric physiologic appearance of the ovaries. There is indistinct appearance of the vaginal cuff which may be normal resolving postoperative changes. No evidence of abscess. No evidence of residual pseudoaneurysm. Stomach/Bowel:  No obstruction. No appendicitis. Vascular/Lymphatic: No acute vascular abnormality. Prominent lower abdominal and pelvic retroperitoneal lymph nodes, likely reactive to recent surgery. Peritoneal: No ascites or pneumoperitoneum. Musculoskeletal: No acute abnormalities. These results were called by telephone at the time of interpretation on 10/11/2015 at 11:27 pm to Dr. Vonzella Nipple , who verbally acknowledged these results. IMPRESSION: 1. Unexpectedly thick abdominal wall incision suggesting surgical site infection. No abscess or evidence of dehiscence. 2. Pelvic inflammatory change could be resolving hematoma and residual postoperative edema. No intra-abdominal abscess. 3. New indeterminate abnormality in the high right liver. Assuming no interval trauma (to suggest laceration), this may be focal fat  deposition. LFTs and enhanced liver MRI is recommended to evaluate for underlying vascular or biliary disease. Electronically Signed   By: Marnee Spring M.D.   On: 10/11/2015 23:29    MAU Course  Procedures None  MDM IV NS CBC and type and screen today CT scan abdomen/pelvis ordered with contrast to evaluate for abscess 1 mg Dilaudid given IV initially - patient reports improvement in pain Patient complaining of increased pain after CT scan - 1 mg Dilaudid IV given again Radiologist called to discuss results. Concerns for liver laceration vs focal fat deposit. Recommends MRI to evaluate further, although could be outpatient if LFTs are WNL.  LFTs unchanged from previous visit. Will continue work-up as outpatient Assessment and Plan  A: Post operative day #30 Incisional pain Cellulitis Incidental abnormal liver finding  P: Discharge home Rx for Percocet and Keflex given to patient Warning signs for worsening condition discussed Patient advised to follow-up with [facility] in 1-2 weeks for follow-up for cellulitis. They will also schedule outpatient MRI for evaluation of liver.  Patient may return to MAU as needed or if her condition were to change or worsen   Marny Lowenstein, PA-C  10/12/2015, 12:34 AM

## 2015-10-11 NOTE — MAU Note (Addendum)
PT  SAYS   SHE HAD  C/S ON 10-20  AND  THEN  HAD  HYST ON 11-20  .  SAYS  ON   12-14-      SHE LOST   HER  APPETITE   AND  THEN  ON  12-15  SHE  STARTED HAVING  SHARP  PAIN  IN HER INCISION-  SAYS HAS LUMPS  AROUND  INCISION- SWELLING.       BOTTLEFEEDING.       SAYS INCISION   ALL INTACT      HAS  TAKEN  IBUPROFEN     - LAST  TIME  - 0900.           LAST  TIME  TOOK TYLENOL-  XS 2 TABS- ON Sunday.

## 2015-10-12 DIAGNOSIS — L03311 Cellulitis of abdominal wall: Secondary | ICD-10-CM | POA: Diagnosis not present

## 2015-10-12 LAB — COMPREHENSIVE METABOLIC PANEL
ALT: 9 U/L — AB (ref 14–54)
AST: 13 U/L — ABNORMAL LOW (ref 15–41)
Albumin: 4.2 g/dL (ref 3.5–5.0)
Alkaline Phosphatase: 58 U/L (ref 38–126)
Anion gap: 11 (ref 5–15)
BILIRUBIN TOTAL: 0.9 mg/dL (ref 0.3–1.2)
BUN: 13 mg/dL (ref 6–20)
CHLORIDE: 105 mmol/L (ref 101–111)
CO2: 24 mmol/L (ref 22–32)
CREATININE: 0.68 mg/dL (ref 0.44–1.00)
Calcium: 9.6 mg/dL (ref 8.9–10.3)
Glucose, Bld: 72 mg/dL (ref 65–99)
POTASSIUM: 3.8 mmol/L (ref 3.5–5.1)
Sodium: 140 mmol/L (ref 135–145)
TOTAL PROTEIN: 8.3 g/dL — AB (ref 6.5–8.1)

## 2015-10-12 LAB — RAPID URINE DRUG SCREEN, HOSP PERFORMED
Amphetamines: NOT DETECTED
Barbiturates: NOT DETECTED
Benzodiazepines: NOT DETECTED
COCAINE: NOT DETECTED
OPIATES: NOT DETECTED
TETRAHYDROCANNABINOL: POSITIVE — AB

## 2015-10-12 MED ORDER — CEPHALEXIN 500 MG PO CAPS: 500.0000 mg | ORAL_CAPSULE | Freq: Four times a day (QID) | ORAL | Status: DC

## 2015-10-12 MED ORDER — OXYCODONE-ACETAMINOPHEN 5-325 MG PO TABS: 1.0000 | ORAL_TABLET | Freq: Four times a day (QID) | ORAL | Status: DC | PRN

## 2015-10-12 NOTE — Discharge Instructions (Signed)
Cellulitis °Cellulitis is an infection of the skin and the tissue under the skin. The infected area is usually red and tender. This happens most often in the arms and lower legs. °HOME CARE  °· Take your antibiotic medicine as told. Finish the medicine even if you start to feel better. °· Keep the infected arm or leg raised (elevated). °· Put a warm cloth on the area up to 4 times per day. °· Only take medicines as told by your doctor. °· Keep all doctor visits as told. °GET HELP IF: °· You see red streaks on the skin coming from the infected area. °· Your red area gets bigger or turns a dark color. °· Your bone or joint under the infected area is painful after the skin heals. °· Your infection comes back in the same area or different area. °· You have a puffy (swollen) bump in the infected area. °· You have new symptoms. °· You have a fever. °GET HELP RIGHT AWAY IF:  °· You feel very sleepy. °· You throw up (vomit) or have watery poop (diarrhea). °· You feel sick and have muscle aches and pains. °  °This information is not intended to replace advice given to you by your health care provider. Make sure you discuss any questions you have with your health care provider. °  °Document Released: 03/26/2008 Document Revised: 06/29/2015 Document Reviewed: 12/24/2011 °Elsevier Interactive Patient Education ©2016 [employer] ° °

## 2015-10-13 ENCOUNTER — Encounter (HOSPITAL_COMMUNITY): Payer: Self-pay | Admitting: *Deleted

## 2015-10-13 ENCOUNTER — Inpatient Hospital Stay (HOSPITAL_COMMUNITY)
Admission: AD | Admit: 2015-10-13 | Discharge: 2015-10-13 | Disposition: A | Discharge status: Home / Self Care | Payer: Medicaid Other | Source: Ambulatory Visit | Attending: Family Medicine | Admitting: Family Medicine

## 2015-10-13 DIAGNOSIS — O860 Infection of obstetric surgical wound: Secondary | ICD-10-CM | POA: Insufficient documentation

## 2015-10-13 DIAGNOSIS — R109 Unspecified abdominal pain: Secondary | ICD-10-CM | POA: Diagnosis present

## 2015-10-13 DIAGNOSIS — L03311 Cellulitis of abdominal wall: Secondary | ICD-10-CM | POA: Insufficient documentation

## 2015-10-13 LAB — CBC WITH DIFFERENTIAL/PLATELET
BASOS ABS: 0 10*3/uL (ref 0.0–0.1)
BASOS PCT: 0 %
EOS ABS: 0.1 10*3/uL (ref 0.0–0.7)
Eosinophils Relative: 2 %
HCT: 25.1 % — ABNORMAL LOW (ref 36.0–46.0)
HEMOGLOBIN: 8 g/dL — AB (ref 12.0–15.0)
LYMPHS ABS: 1.9 10*3/uL (ref 0.7–4.0)
Lymphocytes Relative: 21 %
MCH: 26.4 pg (ref 26.0–34.0)
MCHC: 31.9 g/dL (ref 30.0–36.0)
MCV: 82.8 fL (ref 78.0–100.0)
Monocytes Absolute: 0.4 10*3/uL (ref 0.1–1.0)
Monocytes Relative: 4 %
NEUTROS PCT: 73 %
Neutro Abs: 6.7 10*3/uL (ref 1.7–7.7)
PLATELETS: 268 10*3/uL (ref 150–400)
RBC: 3.03 MIL/uL — AB (ref 3.87–5.11)
RDW: 15.2 % (ref 11.5–15.5)
WBC: 9.1 10*3/uL (ref 4.0–10.5)

## 2015-10-13 LAB — URINALYSIS, ROUTINE W REFLEX MICROSCOPIC
Glucose, UA: NEGATIVE mg/dL
Hgb urine dipstick: NEGATIVE
KETONES UR: NEGATIVE mg/dL
LEUKOCYTES UA: NEGATIVE
NITRITE: NEGATIVE
PH: 6 (ref 5.0–8.0)
PROTEIN: NEGATIVE mg/dL
Specific Gravity, Urine: >1.03 — ABNORMAL HIGH (ref 1.005–1.030)

## 2015-10-13 LAB — POCT PREGNANCY, URINE: PREG TEST UR: NEGATIVE

## 2015-10-13 MED ORDER — LACTATED RINGERS IV BOLUS (SEPSIS)
1000.0000 mL | Freq: Once | INTRAVENOUS | Status: AC
  Administered 2015-10-13: 1000 mL via INTRAVENOUS

## 2015-10-13 MED ORDER — PIPERACILLIN-TAZOBACTAM 3.375 G IVPB
3.3750 g | Freq: Once | INTRAVENOUS | Status: AC
  Administered 2015-10-13: 3.375 g via INTRAVENOUS
  Filled 2015-10-13: qty 50

## 2015-10-13 MED ORDER — DOXYCYCLINE HYCLATE 50 MG PO CAPS: 50.0000 mg | ORAL_CAPSULE | Freq: Two times a day (BID) | ORAL | Status: DC
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
## 2015-10-13 NOTE — MAU Provider Note (Signed)
History     CSN: [id number]  Arrival date and time: 10/13/15 1056   First Provider Initiated Contact with Patient 10/13/15 1145      Chief Complaint  Patient presents with  . Abdominal Pain   HPI MERIEL KELLIHER 24 y.o. [id number] nonpregnant female presents for pain related to recent surgery.  She was seen 2 days ago and treated for cellulitis related to the incision.  She was treated with keflex and percocet. It initially started like a week ago. The swelling and pain has continued to worsen.  She last took percocet this morning for 10/10 pain.  The pain is now 18/10.  It is located across the incision site.  She has swelling of the mons and lower abdomen.  There is no drainage.  She started keflex 4 times a day yesterday.  She's had watery diarrhea 1-2 times per day since using milk of magnesia after her surgery.  The surgery was 11/20 - hysterectomy.  Her appetite is not good.  She denies fever.  Complains of weakness.   OB History    Gravida Para Term Preterm AB TAB SAB Ectopic Multiple Living   0 2 0 2 0 0 3      Past Medical History  Diagnosis Date  . Hx of tonsillectomy   . S/P cesarean section 05/25/2012  . Seizures [facility])     age 12/13- after fell and hit head  . Hypertension     on meds 2011  . Anxiety   . Mental disorder   . Depression   . Bipolar affective (HCC)   . ADD (attention deficit disorder)   . Polysubstance abuse 10/2014    Ectasy use  . History of marijuana use   . History of cocaine use 10/2014  . History of suicidal ideation   . Smoker   . History of VBAC   . History of gestational diabetes mellitus (GDM)     with prior pregnancy  . Hx of eye surgery 02/2015  . S/p supracervical hysterectomy and bilateral salpingectomy on 09/11/15 09/11/2015    Past Surgical History  Procedure Laterality Date  . Tonsillectomy    . Cesarean section  05/25/2012    Procedure: CESAREAN SECTION;  Surgeon: Sherron Monday, MD;  Location: [facility];  Service: Gynecology;   Laterality: N/A;  Primary cesarean section with delivery of baby girl at 0700. Apgars9/9.  Marland Kitchen Orif orbital fracture Left 02/25/2015    Procedure: OPEN REDUCTION INTERNAL FIXATION (ORIF) LEFT ORBITAL FRACTURE;  Surgeon: Melvenia Beam, MD;  Location: [facility] OR;  Service: ENT;  Laterality: Left;  . Cesarean section N/A 08/11/2015    Procedure: CESAREAN SECTION;  Surgeon: Lazaro Arms, MD;  Location: [facility];  Service: Obstetrics;  Laterality: N/A;  . Abdominal hysterectomy N/A 09/11/2015    Procedure: SUPRACERVICAL ABDONIMAL HYSTERECTOMY, ;  Surgeon: Tereso Newcomer, MD;  Location: [facility];  Service: Gynecology;  Laterality: N/A;  . Bilateral salpingectomy  09/11/2015    Procedure: BILATERAL SALPINGECTOMY;  Surgeon: Tereso Newcomer, MD;  Location: [facility];  Service: Gynecology;;    Family History  Problem Relation Age of Onset  . Anesthesia problems Neg Hx   . Cancer Maternal Grandmother   . Hypertension Maternal Grandmother   . Hypertension Mother     Social History  Substance Use Topics  . Smoking status: Current Every Day Smoker -- 0.25 packs/day for 10 years    Types: Cigarettes  . Smokeless tobacco: Never Used  .  Alcohol Use: No     Comment: Former EtOH abuse    Allergies: No Known Allergies  Prescriptions prior to admission  Medication Sig Dispense Refill Last Dose  . acetaminophen (TYLENOL) 500 MG tablet Take 1,000 mg by mouth every 6 (six) hours as needed for moderate pain.   10/11/2015 at Unknown time  . bisacodyl (DULCOLAX) 5 MG EC tablet Take 1 tablet (5 mg total) by mouth 2 (two) times daily as needed for moderate constipation. 30 tablet 0 Past Month at Unknown time  . cephALEXin (KEFLEX) 500 MG capsule Take 1 capsule (500 mg total) by mouth 4 (four) times daily. 28 capsule 0   . docusate sodium (COLACE) 100 MG capsule Take 2 capsules (200 mg total) by mouth 3 (three) times daily as needed for mild constipation. 90 capsule 2 Past Month at Unknown time  . ferrous sulfate 325 (65  FE) MG tablet Take 1 tablet (325 mg total) by mouth 3 (three) times daily with meals. (Patient not taking: Reported on 10/11/2015) 90 tablet 3   . hydrochlorothiazide (HYDRODIURIL) 25 MG tablet Take 1 tablet (25 mg total) by mouth daily. 30 tablet 3 10/11/2015 at Unknown time  . HYDROmorphone (DILAUDID) 2 MG tablet Take 1-2 tablets (2-4 mg total) by mouth every 4 (four) hours as needed for severe pain. (Patient not taking: Reported on 10/11/2015) 60 tablet 0 Not Taking at Unknown time  . ibuprofen (ADVIL,MOTRIN) 600 MG tablet Take 1 tablet (600 mg total) by mouth every 6 (six) hours as needed (mild pain). 60 tablet 2 10/11/2015 at Unknown time  . magnesium hydroxide (MILK OF MAGNESIA) 400 MG/5ML suspension Take 30 mLs by mouth daily as needed for moderate constipation. (Patient not taking: Reported on 10/11/2015) 360 mL 3 Not Taking at Unknown time  . metoCLOPramide (REGLAN) 10 MG tablet Take 1 tablet (10 mg total) by mouth 4 (four) times daily as needed for nausea or vomiting. (Patient not taking: Reported on 10/11/2015) 30 tablet 2 Not Taking at Unknown time  . oxyCODONE-acetaminophen (PERCOCET/ROXICET) 5-325 MG tablet Take 1-2 tablets by mouth every 6 (six) hours as needed for severe pain. 20 tablet 0   . QUEtiapine (SEROQUEL) 200 MG tablet Take 1 tablet (200 mg total) by mouth at bedtime. 30 tablet 8 Past Week at Unknown time    ROS Pertinent ROS in HPI.  All other systems are negative.   Physical Exam   Blood pressure 146/76, pulse 92, temperature 98.2 F (36.8 C), temperature source Oral, resp. rate 18, height 5\' 7"  (1.702 m), weight 210 lb 12.8 oz (95.618 kg), last menstrual period 10/22/2014, not currently breastfeeding.  Physical Exam  Constitutional: She is oriented to person, place, and time. She appears well-developed and well-nourished. No distress.  HENT:  Head: Normocephalic and atraumatic.  Eyes: Conjunctivae and EOM are normal.  Neck: Normal range of motion. Neck supple.   Cardiovascular: Normal rate, regular rhythm and normal heart sounds.   Respiratory: Effort normal and breath sounds normal. No respiratory distress.  GI:  Lower abdomen with incision from recent surgeries - incision well healed but surrounding tissue with swelling, erythema and warmth.  There is substantial induration both above and below incision approx 4-5" in diameter  Musculoskeletal: Normal range of motion.  Neurological: She is alert and oriented to person, place, and time.  Skin: Skin is warm and dry.  Psychiatric: She has a normal mood and affect. Her behavior is normal.   Results for orders placed or performed during the hospital  encounter of 10/13/15 (from the past 24 hour(s))  Urinalysis, Routine w reflex microscopic (not at [facility])     Status: Abnormal   Collection Time: 10/13/15 11:00 AM  Result Value Ref Range   Color, Urine YELLOW YELLOW   APPearance CLEAR CLEAR   Specific Gravity, Urine >1.030 (H) 1.005 - 1.030   pH 6.0 5.0 - 8.0   Glucose, UA NEGATIVE NEGATIVE mg/dL   Hgb urine dipstick NEGATIVE NEGATIVE   Bilirubin Urine SMALL (A) NEGATIVE   Ketones, ur NEGATIVE NEGATIVE mg/dL   Protein, ur NEGATIVE NEGATIVE mg/dL   Nitrite NEGATIVE NEGATIVE   Leukocytes, UA NEGATIVE NEGATIVE  Pregnancy, urine POC     Status: None   Collection Time: 10/13/15 11:11 AM  Result Value Ref Range   Preg Test, Ur NEGATIVE NEGATIVE  CBC with Differential/Platelet     Status: Abnormal   Collection Time: 10/13/15 12:05 PM  Result Value Ref Range   WBC 9.1 4.0 - 10.5 K/uL   RBC 3.03 (L) 3.87 - 5.11 MIL/uL   Hemoglobin 8.0 (L) 12.0 - 15.0 g/dL   HCT 40.9 (L) 81.1 - 91.4 %   MCV 82.8 78.0 - 100.0 fL   MCH 26.4 26.0 - 34.0 pg   MCHC 31.9 30.0 - 36.0 g/dL   RDW 78.2 95.6 - 21.3 %   Platelets 268 150 - 400 K/uL   Neutrophils Relative % 73 %   Neutro Abs 6.7 1.7 - 7.7 K/uL   Lymphocytes Relative 21 %   Lymphs Abs 1.9 0.7 - 4.0 K/uL   Monocytes Relative 4 %   Monocytes Absolute 0.4 0.1 -  1.0 K/uL   Eosinophils Relative 2 %   Eosinophils Absolute 0.1 0.0 - 0.7 K/uL   Basophils Relative 0 %   Basophils Absolute 0.0 0.0 - 0.1 K/uL     MAU Course  Procedures  MDM CBC ordered.  No fever.  No elevated white count.  Dr. Shawnie Pons consulted and agreeable to come and eval pt.  She discussed inpatient vs. Outpatient therapy.  Pt chooses outpatient.  Dr. Shawnie Pons advises for IV Zosyn x 1 dose and then discharge with doxycycline.   IV Zosyn ordered.   Pt caused some drama with partner in room and arguing.  IV start was delayed.  Female was asked to leave and did so without incident.   Pt insisting she needs to be out of unit within next 2 hours.  Pharmacy notes Zosyn can be given over 30 minutes.  Nursing staff informed.   Nurse asks for SW consult as pt desires to talk with someone.  SW consult placed. Pt again arguing on phone and requesting discharge asap.  Assessment and Plan  A:  1. Cellulitis of abdominal wall    P: Discharge to home Doxycycline  bid x 14 days Clinic to call and schedule 1 week follow up appt Cont PNV Return for fever, increased pain, weakness, etc. Patient may return to MAU as needed or if her condition were to change or worsen   Bertram Denver 10/13/2015, 11:47 AM

## 2015-10-13 NOTE — MAU Note (Signed)
Pt states she was given medication when she was here the other day.  Pt states she has been taking the medication as prescribed but the swelling around her incision has gotten worse.  The incision site is painful and feels like she is having contractions.
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
## 2015-10-13 NOTE — Discharge Instructions (Signed)

## 2015-10-28 ENCOUNTER — Encounter: Payer: Medicaid Other | Admitting: Family Medicine
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
## 2015-11-23 ENCOUNTER — Emergency Department (HOSPITAL_COMMUNITY)
Admission: EM | Admit: 2015-11-23 | Discharge: 2015-11-24 | Disposition: A | Discharge status: Home / Self Care | Payer: Medicaid Other | Attending: Emergency Medicine | Admitting: Emergency Medicine

## 2015-11-23 ENCOUNTER — Emergency Department (HOSPITAL_COMMUNITY): Payer: Medicaid Other

## 2015-11-23 ENCOUNTER — Encounter (HOSPITAL_COMMUNITY): Payer: Self-pay | Admitting: *Deleted

## 2015-11-23 DIAGNOSIS — Z9089 Acquired absence of other organs: Secondary | ICD-10-CM | POA: Insufficient documentation

## 2015-11-23 DIAGNOSIS — Z8659 Personal history of other mental and behavioral disorders: Secondary | ICD-10-CM | POA: Diagnosis not present

## 2015-11-23 DIAGNOSIS — R634 Abnormal weight loss: Secondary | ICD-10-CM | POA: Diagnosis not present

## 2015-11-23 DIAGNOSIS — Z90711 Acquired absence of uterus with remaining cervical stump: Secondary | ICD-10-CM | POA: Diagnosis not present

## 2015-11-23 DIAGNOSIS — I10 Essential (primary) hypertension: Secondary | ICD-10-CM | POA: Insufficient documentation

## 2015-11-23 DIAGNOSIS — Z98891 History of uterine scar from previous surgery: Secondary | ICD-10-CM | POA: Insufficient documentation

## 2015-11-23 DIAGNOSIS — R1011 Right upper quadrant pain: Secondary | ICD-10-CM | POA: Diagnosis present

## 2015-11-23 DIAGNOSIS — F17210 Nicotine dependence, cigarettes, uncomplicated: Secondary | ICD-10-CM | POA: Insufficient documentation

## 2015-11-23 DIAGNOSIS — Z3202 Encounter for pregnancy test, result negative: Secondary | ICD-10-CM | POA: Diagnosis not present

## 2015-11-23 DIAGNOSIS — Z8632 Personal history of gestational diabetes: Secondary | ICD-10-CM | POA: Insufficient documentation

## 2015-11-23 DIAGNOSIS — F1210 Cannabis abuse, uncomplicated: Secondary | ICD-10-CM | POA: Diagnosis not present

## 2015-11-23 DIAGNOSIS — Z9889 Other specified postprocedural states: Secondary | ICD-10-CM | POA: Diagnosis not present

## 2015-11-23 LAB — CBC WITH DIFFERENTIAL/PLATELET
BASOS ABS: 0 10*3/uL (ref 0.0–0.1)
Basophils Relative: 0 %
EOS PCT: 2 %
Eosinophils Absolute: 0.1 10*3/uL (ref 0.0–0.7)
HEMATOCRIT: 32.7 % — AB (ref 36.0–46.0)
HEMOGLOBIN: 10.3 g/dL — AB (ref 12.0–15.0)
LYMPHS ABS: 2.7 10*3/uL (ref 0.7–4.0)
LYMPHS PCT: 34 %
MCH: 25.3 pg — AB (ref 26.0–34.0)
MCHC: 31.5 g/dL (ref 30.0–36.0)
MCV: 80.3 fL (ref 78.0–100.0)
Monocytes Absolute: 0.6 10*3/uL (ref 0.1–1.0)
Monocytes Relative: 7 %
NEUTROS ABS: 4.6 10*3/uL (ref 1.7–7.7)
Neutrophils Relative %: 57 %
Platelets: 257 10*3/uL (ref 150–400)
RBC: 4.07 MIL/uL (ref 3.87–5.11)
RDW: 16.2 % — ABNORMAL HIGH (ref 11.5–15.5)
WBC: 8 10*3/uL (ref 4.0–10.5)

## 2015-11-23 LAB — COMPREHENSIVE METABOLIC PANEL
ALBUMIN: 4 g/dL (ref 3.5–5.0)
ALK PHOS: 51 U/L (ref 38–126)
ALT: 18 U/L (ref 14–54)
AST: 18 U/L (ref 15–41)
Anion gap: 10 (ref 5–15)
BILIRUBIN TOTAL: 0.4 mg/dL (ref 0.3–1.2)
BUN: 11 mg/dL (ref 6–20)
CALCIUM: 9.9 mg/dL (ref 8.9–10.3)
CHLORIDE: 106 mmol/L (ref 101–111)
CO2: 25 mmol/L (ref 22–32)
CREATININE: 0.7 mg/dL (ref 0.44–1.00)
GFR calc Af Amer: >60 mL/min (ref >60)
GFR calc non Af Amer: >60 mL/min (ref >60)
GLUCOSE: 91 mg/dL (ref 65–99)
Potassium: 3.9 mmol/L (ref 3.5–5.1)
SODIUM: 141 mmol/L (ref 135–145)
Total Protein: 7.2 g/dL (ref 6.5–8.1)

## 2015-11-23 LAB — URINALYSIS, ROUTINE W REFLEX MICROSCOPIC
BILIRUBIN URINE: NEGATIVE
Glucose, UA: NEGATIVE mg/dL
HGB URINE DIPSTICK: NEGATIVE
Ketones, ur: NEGATIVE mg/dL
Leukocytes, UA: NEGATIVE
NITRITE: NEGATIVE
PH: 7.5 (ref 5.0–8.0)
Protein, ur: NEGATIVE mg/dL
SPECIFIC GRAVITY, URINE: 1.016 (ref 1.005–1.030)

## 2015-11-23 LAB — I-STAT BETA HCG BLOOD, ED (MC, WL, AP ONLY): I-stat hCG, quantitative: <5 m[IU]/mL (ref <5)

## 2015-11-23 LAB — LIPASE, BLOOD: Lipase: 24 U/L (ref 11–51)

## 2015-11-23 MED ORDER — FENTANYL CITRATE (PF) 100 MCG/2ML IJ SOLN
100.0000 ug | Freq: Once | INTRAMUSCULAR | Status: AC
  Administered 2015-11-23: 100 ug via INTRAVENOUS
  Filled 2015-11-23: qty 2

## 2015-11-23 MED ORDER — FENTANYL CITRATE (PF) 100 MCG/2ML IJ SOLN
100.0000 ug | Freq: Once | INTRAMUSCULAR | Status: AC
  Administered 2015-11-24: 100 ug via INTRAVENOUS
  Filled 2015-11-23: qty 2

## 2015-11-23 MED ORDER — ONDANSETRON HCL 4 MG/2ML IJ SOLN
4.0000 mg | Freq: Once | INTRAMUSCULAR | Status: AC
  Administered 2015-11-23: 4 mg via INTRAVENOUS
  Filled 2015-11-23: qty 2

## 2015-11-23 NOTE — ED Notes (Signed)
Pt to ED by EMS c/o R sided upper and lower abdominal since 9am, constipation and poor po intake x 1 week. Hx of htn, does not take any medications.

## 2015-11-23 NOTE — ED Notes (Signed)
Pt taken to xray 

## 2015-11-23 NOTE — ED Notes (Signed)
MD at bedside. 

## 2015-11-23 NOTE — ED Provider Notes (Signed)
CSN: [id number]     Arrival date & time 11/23/15  2142 History   First MD Initiated Contact with Patient 11/23/15 2208     Chief Complaint  Patient presents with  . Abdominal Pain      HPI Patient presents with right upper quadrant abdominal pain which started around 9:00 this morning.  Has had unexplained weight loss over the last month.  Had history of recent hysterectomy at the end of 2016.  CT scan at that time showed an abnormality in the liver and recommended an MR MRI scan for further evaluation.  Patient denies any fever or chills.  She said she did have some vomiting. Past Medical History  Diagnosis Date  . Hx of tonsillectomy   . S/P cesarean section 05/25/2012  . Seizures [facility])     age [age]- after fell and hit head  . Hypertension     on meds 2011  . Anxiety   . Mental disorder   . Depression   . Bipolar affective (HCC)   . ADD (attention deficit disorder)   . Polysubstance abuse 10/2014    Ectasy use  . History of marijuana use   . History of cocaine use 10/2014  . History of suicidal ideation   . Smoker   . History of VBAC   . History of gestational diabetes mellitus (GDM)     with prior pregnancy  . Hx of eye surgery 02/2015  . S/p supracervical hysterectomy and bilateral salpingectomy on 09/11/15 09/11/2015   Past Surgical History  Procedure Laterality Date  . Tonsillectomy    . Cesarean section  05/25/2012    Procedure: CESAREAN SECTION;  Surgeon: Sherron Monday, MD;  Location: [facility];  Service: Gynecology;  Laterality: N/A;  Primary cesarean section with delivery of baby girl at 0700. Apgars9/9.  Marland Kitchen Orif orbital fracture Left 02/25/2015    Procedure: OPEN REDUCTION INTERNAL FIXATION (ORIF) LEFT ORBITAL FRACTURE;  Surgeon: Melvenia Beam, MD;  Location: [facility] OR;  Service: ENT;  Laterality: Left;  . Cesarean section N/A 08/11/2015    Procedure: CESAREAN SECTION;  Surgeon: Lazaro Arms, MD;  Location: [facility];  Service: Obstetrics;  Laterality: N/A;  . Abdominal  hysterectomy N/A 09/11/2015    Procedure: SUPRACERVICAL ABDONIMAL HYSTERECTOMY, ;  Surgeon: Tereso Newcomer, MD;  Location: [facility];  Service: Gynecology;  Laterality: N/A;  . Bilateral salpingectomy  09/11/2015    Procedure: BILATERAL SALPINGECTOMY;  Surgeon: Tereso Newcomer, MD;  Location: [facility];  Service: Gynecology;;   Family History  Problem Relation Age of Onset  . Anesthesia problems Neg Hx   . Cancer Maternal Grandmother   . Hypertension Maternal Grandmother   . Hypertension Mother    Social History  Substance Use Topics  . Smoking status: Current Every Day Smoker -- 0.25 packs/day for 10 years    Types: Cigarettes  . Smokeless tobacco: Never Used  . Alcohol Use: No     Comment: Former EtOH abuse   OB History    Gravida Para Term Preterm AB TAB SAB Ectopic Multiple Living   0 2 0 2 0 0 3     Review of Systems  Constitutional: Positive for unexpected weight change.  All other systems reviewed and are negative.     Allergies  Review of patient's allergies indicates no known allergies.  Home Medications   Prior to Admission medications   Medication Sig Start Date End Date Taking? Authorizing Provider  dicyclomine (BENTYL) 20 MG tablet  Take 1 tablet (20 mg total) by mouth 3 (three) times daily before meals. As needed for abdominal pain 11/24/15   Kristen N Ward, DO  ondansetron (ZOFRAN ODT) 4 MG disintegrating tablet Take 1 tablet (4 mg total) by mouth every 8 (eight) hours as needed for nausea or vomiting. 11/24/15   Kristen N Ward, DO  pantoprazole (PROTONIX) 40 MG tablet Take 1 tablet (40 mg total) by mouth daily. 11/24/15   Kristen N Ward, DO   BP 143/83 mmHg  Pulse 58  Temp(Src) 97.4 F (36.3 C) (Oral)  Resp 16  SpO2 100%  LMP 10/22/2014 (Approximate) Physical Exam  Constitutional: She is oriented to person, place, and time. She appears well-developed and well-nourished. No distress.  HENT:  Head: Normocephalic and atraumatic.  Eyes: Pupils are equal,  round, and reactive to light.  Neck: Normal range of motion.  Cardiovascular: Normal rate and intact distal pulses.   Pulmonary/Chest: No respiratory distress.  Abdominal: Normal appearance. She exhibits no distension. There is tenderness in the right upper quadrant. There is no rigidity, no rebound and no guarding.  Musculoskeletal: Normal range of motion.  Neurological: She is alert and oriented to person, place, and time. No cranial nerve deficit.  Skin: Skin is warm and dry. No rash noted.  Psychiatric: She has a normal mood and affect. Her behavior is normal.  Nursing note and vitals reviewed.   ED Course  Procedures (including critical care time) Medications  fentaNYL (SUBLIMAZE) injection 100 mcg (100 mcg Intravenous Given 11/23/15 2236)  ondansetron (ZOFRAN) injection 4 mg (4 mg Intravenous Given 11/23/15 2236)  fentaNYL (SUBLIMAZE) injection 100 mcg (100 mcg Intravenous Given 11/24/15 0003)  morphine 4 MG/ML injection 4 mg (4 mg Intravenous Given 11/24/15 0128)  morphine 4 MG/ML injection 4 mg (4 mg Intravenous Given 11/24/15 0417)  gadobenate dimeglumine (MULTIHANCE) injection 20 mL (20 mLs Intravenous Contrast Given 11/24/15 0330)    Results for orders placed or performed during the hospital encounter of 11/23/15  CBC with Differential/Platelet  Result Value Ref Range   WBC 8.0 4.0 - 10.5 K/uL   RBC 4.07 3.87 - 5.11 MIL/uL   Hemoglobin 10.3 (L) 12.0 - 15.0 g/dL   HCT 29.5 (L) 62.1 - 30.8 %   MCV 80.3 78.0 - 100.0 fL   MCH 25.3 (L) 26.0 - 34.0 pg   MCHC 31.5 30.0 - 36.0 g/dL   RDW 65.7 (H) 84.6 - 96.2 %   Platelets 257 150 - 400 K/uL   Neutrophils Relative % 57 %   Neutro Abs 4.6 1.7 - 7.7 K/uL   Lymphocytes Relative 34 %   Lymphs Abs 2.7 0.7 - 4.0 K/uL   Monocytes Relative 7 %   Monocytes Absolute 0.6 0.1 - 1.0 K/uL   Eosinophils Relative 2 %   Eosinophils Absolute 0.1 0.0 - 0.7 K/uL   Basophils Relative 0 %   Basophils Absolute 0.0 0.0 - 0.1 K/uL  Comprehensive metabolic  panel  Result Value Ref Range   Sodium 141 135 - 145 mmol/L   Potassium 3.9 3.5 - 5.1 mmol/L   Chloride 106 101 - 111 mmol/L   CO2 25 22 - 32 mmol/L   Glucose, Bld 91 65 - 99 mg/dL   BUN 11 6 - 20 mg/dL   Creatinine, Ser 9.52 0.44 - 1.00 mg/dL   Calcium 9.9 8.9 - 84.1 mg/dL   Total Protein 7.2 6.5 - 8.1 g/dL   Albumin 4.0 3.5 - 5.0 g/dL   AST 18 15 -  41 U/L   ALT 18 14 - 54 U/L   Alkaline Phosphatase 51 38 - 126 U/L   Total Bilirubin 0.4 0.3 - 1.2 mg/dL   GFR calc non Af Amer >60 >60 mL/min   GFR calc Af Amer >60 >60 mL/min   Anion gap 10 5 - 15  Lipase, blood  Result Value Ref Range   Lipase 24 11 - 51 U/L  Urinalysis, Routine w reflex microscopic (not at [facility])  Result Value Ref Range   Color, Urine YELLOW YELLOW   APPearance CLOUDY (A) CLEAR   Specific Gravity, Urine 1.016 1.005 - 1.030   pH 7.5 5.0 - 8.0   Glucose, UA NEGATIVE NEGATIVE mg/dL   Hgb urine dipstick NEGATIVE NEGATIVE   Bilirubin Urine NEGATIVE NEGATIVE   Ketones, ur NEGATIVE NEGATIVE mg/dL   Protein, ur NEGATIVE NEGATIVE mg/dL   Nitrite NEGATIVE NEGATIVE   Leukocytes, UA NEGATIVE NEGATIVE  Urine rapid drug screen (hosp performed)  Result Value Ref Range   Opiates NONE DETECTED NONE DETECTED   Cocaine NONE DETECTED NONE DETECTED   Benzodiazepines NONE DETECTED NONE DETECTED   Amphetamines NONE DETECTED NONE DETECTED   Tetrahydrocannabinol POSITIVE (A) NONE DETECTED   Barbiturates NONE DETECTED NONE DETECTED  I-Stat Beta hCG blood, ED ([facility], [facility], [facility] only)  Result Value Ref Range   I-stat hCG, quantitative <5.0 <5 mIU/mL   Comment 3           Dg Abd 1 View  11/23/2015  CLINICAL DATA:  Abdominal pain and back pain. EXAM: ABDOMEN - 1 VIEW COMPARISON:  10/11/2015 FINDINGS: The bowel gas pattern is normal. No radio-opaque calculi or other significant radiographic abnormality are seen. IMPRESSION: Negative. Electronically Signed   By: Signa Kell M.D.   On: 11/23/2015 23:14   Mr Abdomen W Wo  Contrast  11/24/2015  CLINICAL DATA:  Right upper quadrant abdominal pain. Status post hysterectomy and bilateral salpingectomy on 09/11/2015. EXAM: MRI ABDOMEN WITHOUT AND WITH CONTRAST TECHNIQUE: Multiplanar multisequence MR imaging of the abdomen was performed both before and after the administration of intravenous contrast. CONTRAST:  20 cc MultiHance IV. COMPARISON:  10/11/2015 CT abdomen/ pelvis. FINDINGS: Lower chest: Clear lung bases. Hepatobiliary: Normal liver size and configuration. No hepatic steatosis. No liver mass. Normal gallbladder with no cholelithiasis. No biliary ductal dilatation. Common bile duct diameter 3 mm. No choledocholithiasis. Pancreas: No pancreatic mass or duct dilation. No evidence of pancreas divisum. Spleen: Normal size. No mass. Adrenals/Urinary Tract: Normal adrenals. No hydronephrosis. Normal kidneys with no renal mass. Stomach/Bowel: Grossly normal stomach. Visualized small and large bowel is normal caliber, with no bowel wall thickening. Vascular/Lymphatic: Normal caliber abdominal aorta. Patent portal, splenic, hepatic and renal veins. No pathologically enlarged lymph nodes in the abdomen. Other: No abdominal ascites or focal fluid collection. Musculoskeletal: No aggressive appearing focal osseous lesions. IMPRESSION: No acute abnormality. Normal gallbladder with no cholelithiasis. No biliary ductal dilatation. Electronically Signed   By: Delbert Phenix M.D.   On: 11/24/2015 08:01       MDM   Final diagnoses:  Right upper quadrant pain  Marijuana abuse        Nelva Nay, MD 11/27/15 [phone]

## 2015-11-24 ENCOUNTER — Emergency Department (HOSPITAL_COMMUNITY): Payer: Medicaid Other

## 2015-11-24 LAB — RAPID URINE DRUG SCREEN, HOSP PERFORMED
Amphetamines: NOT DETECTED
Barbiturates: NOT DETECTED
Benzodiazepines: NOT DETECTED
COCAINE: NOT DETECTED
OPIATES: NOT DETECTED
TETRAHYDROCANNABINOL: POSITIVE — AB

## 2015-11-24 MED ORDER — MORPHINE SULFATE (PF) 4 MG/ML IV SOLN
4.0000 mg | Freq: Once | INTRAVENOUS | Status: AC
  Administered 2015-11-24: 4 mg via INTRAVENOUS
  Filled 2015-11-24: qty 1

## 2015-11-24 MED ORDER — ONDANSETRON 4 MG PO TBDP: 4.0000 mg | ORAL_TABLET | Freq: Three times a day (TID) | ORAL | Status: DC | PRN

## 2015-11-24 MED ORDER — PANTOPRAZOLE SODIUM 40 MG PO TBEC: 40.0000 mg | DELAYED_RELEASE_TABLET | Freq: Every day | ORAL | Status: DC

## 2015-11-24 MED ORDER — GADOBENATE DIMEGLUMINE 529 MG/ML IV SOLN
20.0000 mL | Freq: Once | INTRAVENOUS | Status: AC | PRN
  Administered 2015-11-24: 20 mL via INTRAVENOUS

## 2015-11-24 MED ORDER — DICYCLOMINE HCL 20 MG PO TABS: 20.0000 mg | ORAL_TABLET | Freq: Three times a day (TID) | ORAL | Status: DC

## 2015-11-24 NOTE — ED Provider Notes (Signed)
5:45 AM  Assumed care from Dr. Radford Pax.  Pt is a 25 y.o. with history of substance abuse who presents to the emergency department right upper quadrant pain is her yesterday morning. Has had some vomiting. Awaiting MRI. Labs have been unremarkable as well as urine. Urine drug screen is positive for marijuana. MRI shows no acute abnormality. Normal gallbladder and no ductal dilatation. Lesion that was seen previously in the liver on CT scan in December appears normal on MRI. Her LFTs and lipase have been normal. No lower abdominal tenderness. No sign of a life-threatening illness. Will discharge with prescriptions for Protonix, Bentyl, Zofran. Have given her outpatient GI follow-up information. She does have a PCP. Discussed return precautions. She verbalized understanding and is comfortable with this plan.  Stacy Maw Loretta Kluender, DO 11/24/15 [phone]

## 2015-11-24 NOTE — ED Notes (Signed)
RN called MRI re: MRI abd results; MRI tech states there is a prelim interrogation on the actual images; will inform ER MD

## 2015-11-24 NOTE — ED Notes (Signed)
RN called MRI re: MRI abd results; MRI tech states study was submitted as soon as patients study was completed

## 2015-11-24 NOTE — ED Notes (Signed)
Pt asking for "cab voucher" upon presenting discharge instructions; Pt also verbally upset that discharge diagnosis states "marijuana abuse"; pt asking "who tested my urine for drugs?"; "why yall testing my urine for drugs?"; RN continued to discuss discharge instructions despite patients questions and obtained discharge signature; pt then got on phone stating "guess what my diagnosis is- marijuana abuse"; [employer] in hallway available to escort patient to lobby if necessary

## 2015-11-24 NOTE — Discharge Instructions (Signed)

## 2015-11-24 NOTE — ED Notes (Signed)
Patient transported to MRI 

## 2016-05-17 IMAGING — XA IR US GUIDE VASC ACCESS RIGHT
12 of 21 series · 12 of 24 positions shown · non-contrast
Comparison: none

INDICATION: Postpartum hemorrhage with pelvic ultrasound demonstrating an
approximately 2.4 cm pseudoaneurysm within the right posterior lower
uterine segment. Please perform pelvic arteriogram and potential
percutaneous embolization.

[Series 1: run · 1 of 43 slices shown (1 of 12)]
[im 43/43]
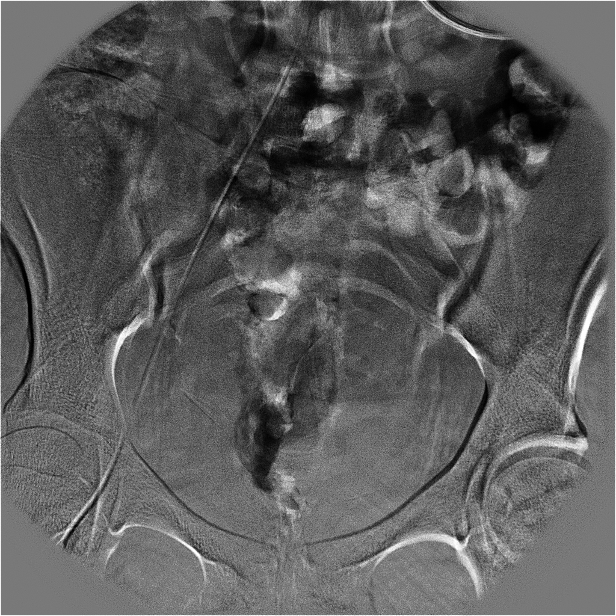

[Series 2: run · 1 of 14 slices shown (2 of 12)]
[im 1/14]
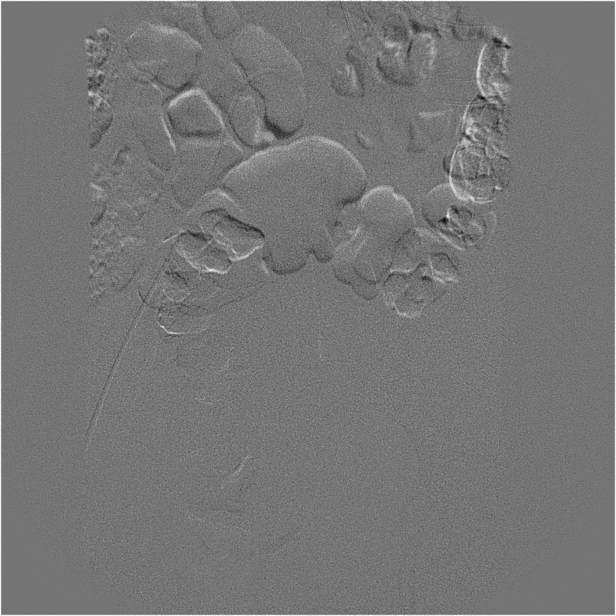

[Series 4: run · 1 of 6 slices shown (3 of 12)]
[im 1/6]
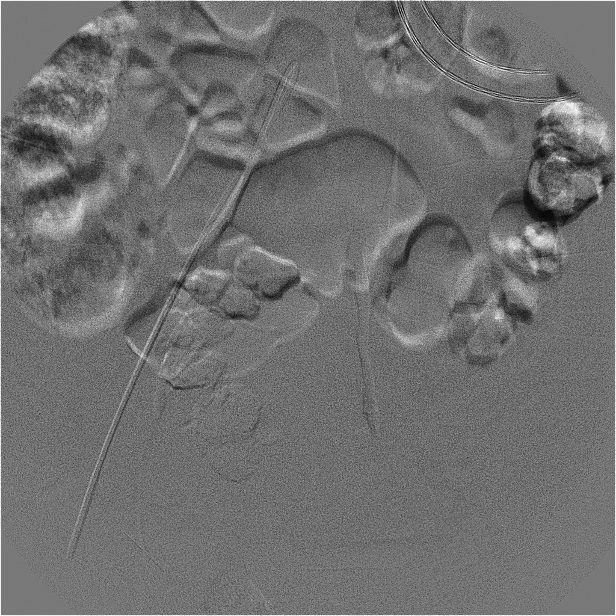

[Series 6: run · 1 of 13 slices shown (4 of 12)]
[im 1/13]
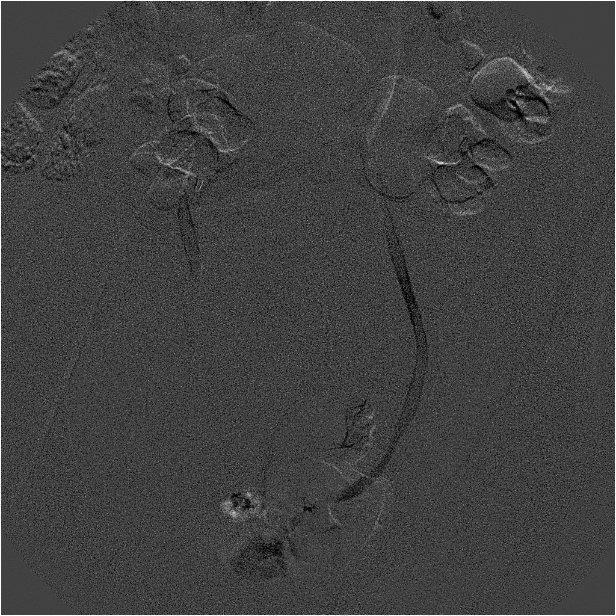

[Series 8: run · 1 of 14 slices shown (5 of 12)]
[im 1/14]
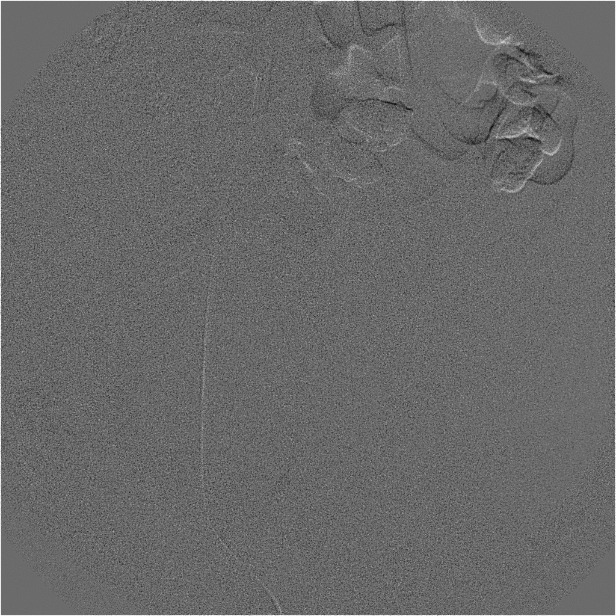

[Series 10: run · 1 of 18 slices shown (6 of 12)]
[im 1/18]
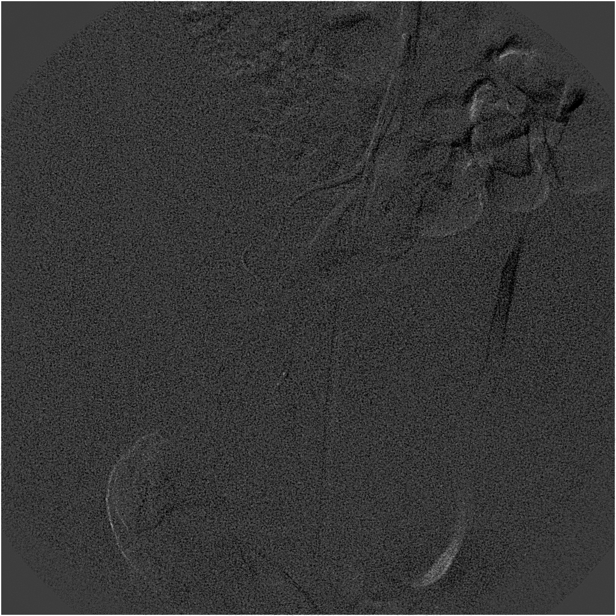

[Series 12: run · 1 of 19 slices shown (7 of 12)]
[im 1/19]
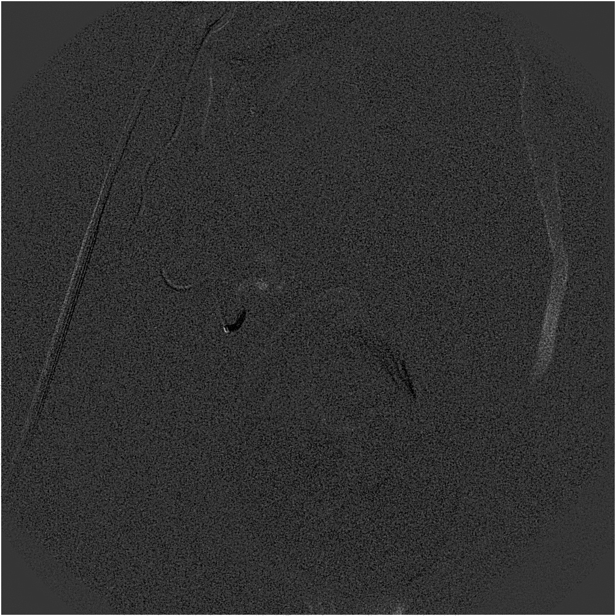

[Series 14: run · 1 of 17 slices shown (8 of 12)]
[im 1/17]
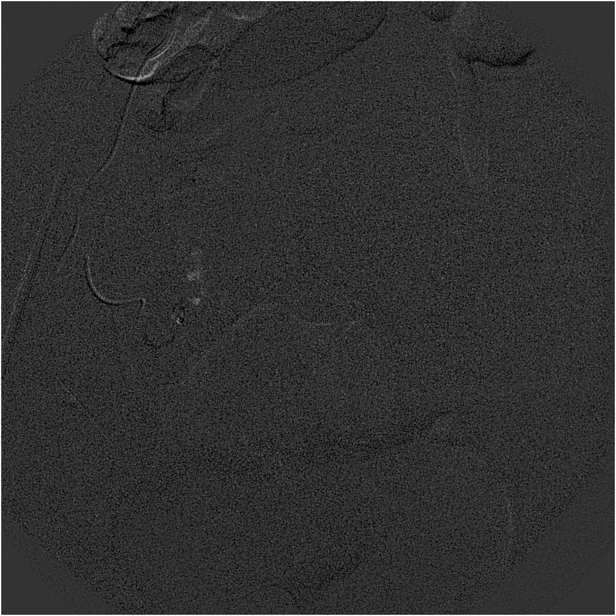

[Series 16: run · 1 of 26 slices shown (9 of 12)]
[im 1/26]
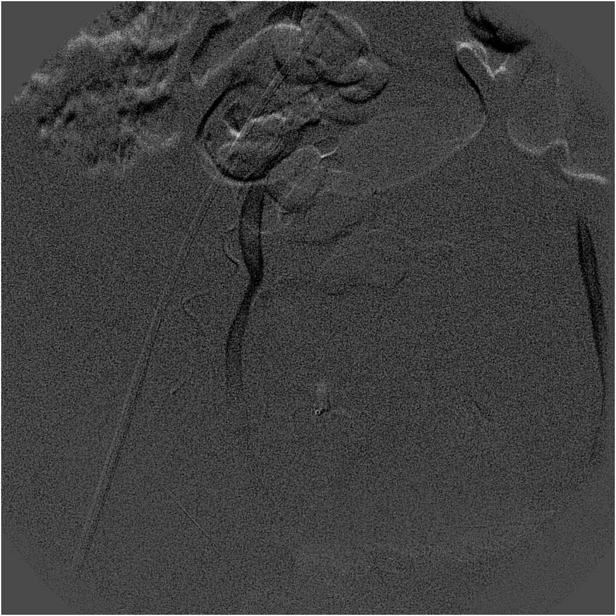

[Series 16: run · 1 of 26 slices shown (10 of 12)]
[im 26/26]
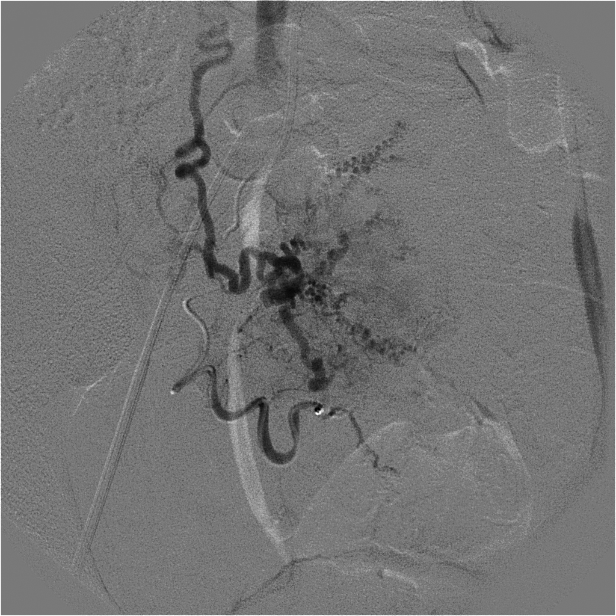

[Series 18: run · 1 of 25 slices shown (11 of 12)]
[im 1/25]
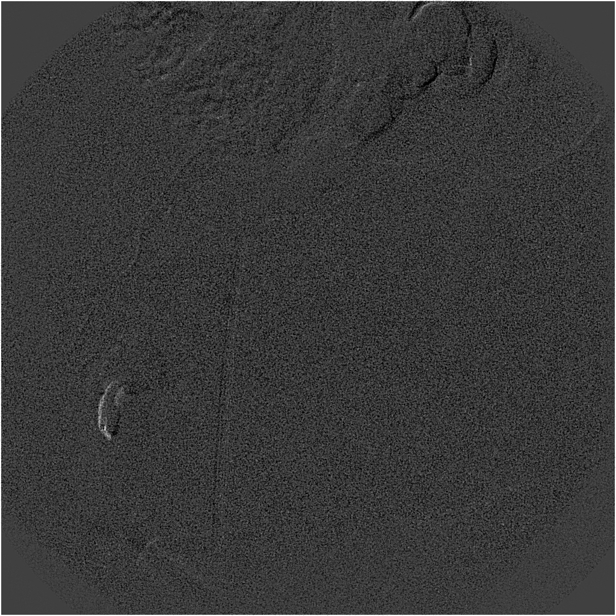

[Series 19: run · 1 of 19 slices shown (12 of 12)]
[im 1/19]
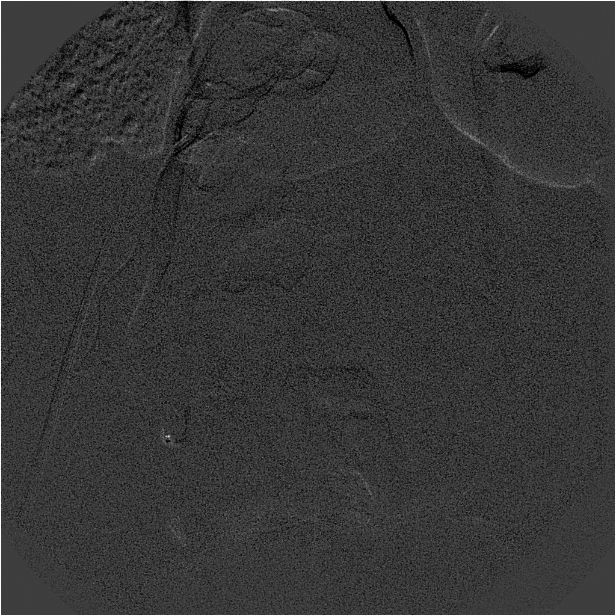

[12 of 24 positions shown; findings below may reference images not displayed]

EXAM:
1. ULTRASOUND GUIDANCE FOR ARTERIAL ACCESS.
2. PELVIC ARTERIOGRAM
3. SELECTIVE BILATERAL INTERNAL ILIAC ARTERIOGRAMS (3rd ORDER)
4. SELECTIVE RIGHT UTERINE ARTERIOGRAM AND PERCUTANEOUS PARTICLE
EMBOLIZATION (3rd ORDER)

MEDICATIONS:
None

ANESTHESIA/SEDATION:
Fentanyl 25 mcg IV; Versed 0.5 mg IV

Sedation time

75 minutes

CONTRAST:  150mL OMNIPAQUE IOHEXOL 300 MG/ML  SOLN

FLUOROSCOPY TIME:  17 minutes 54 seconds (2136.4 mGy)

COMPLICATIONS:
None immediate



The right femoral head was marked fluoroscopically. Under sterile
conditions and local anesthesia, the right common femoral artery
access was performed with a micropuncture needle. Under direct
ultrasound guidance, the right common femoral was accessed with a
micropuncture kit. An ultrasound image was saved for documentation
purposes. This allowed for placement of a 5-French vascular sheath.
A limited arteriogram was performed through the side arm of the
sheath confirming appropriate access within the right common femoral
artery.

Over a Bentson wire, a pigtail Flush catheter was advanced to the
level of the caudal aspect of the abdominal aorta and a pelvic
arteriogram was performed.

The 5-French C2 catheter was utilized to select the contralateral
left internal iliac artery. Selective left internal iliac angiogram
was performed.

The C2 catheter was retracted and utilized to select the ipsilateral
right internal iliac artery. Selective right internal iliac
angiogram was performed. The patent right uterine artery was
identified. For selective catheterization, a Fathom 0.014 micro wire
was utilized to advance a regular Renegade microcatheter into the
right uterine artery and a selective right uterine arteriogram was
performed.

The distal vascular tree of the right uterine artery was embolized
with approximately 1.5 vials of 700-900 micron Embospheres. Post
embolization right uterine arteriogram was performed

At this point, all wires, catheters and sheaths were removed from
the patient. Hemostasis was achieved at the right groin access site
with

The patient tolerated the procedure well without immediate post
procedural complication.
FINDINGS: Initial pelvic arteriogram does not definitely demonstrate active
extravasation from either uterine arteries.

Selective left internal iliac arteriogram demonstrates a normal
appearing left uterine artery without definitive area of vessel
irregularity or extravasation.

Selective right internal iliac arteriogram was performed
demonstrating patency of the tortuous right uterine artery but
without definitive area of active extravasation or contribution to
the patient's known pseudoaneurysm within the right lower uterine
segment.

As such, a selective right uterine arteriogram was performed which
in hind site likely contributed to a tiny pseudoaneurysm
(representative series 9) which correlates with the finding seen on
the preceding pelvic ultrasound (note, this pseudoaneurysm appears
smaller than on preceding pelvic ultrasound and was not definitively
recognized at the time of catheter directed angiography).

Given the findings seen on preceding pelvic ultrasound, the decision
was made to perform a prophylactic particle embolization of the
right uterine artery vascular territory which was subsequently
accomplished with the administration of approximately 1.5 vials of
700 to 900 micron Embospheres. (Note, 1 of the post embolization
arteriogram demonstrates retrograde filling of a hypertrophied right
ovarian artery - series 16).

Completion arteriogram demonstrates a technically adequate results
with near complete stasis of the right uterine artery vascular tree
(representative series 18 and 19).
IMPRESSION: Technically successful right uterine artery percutaneous particle
embolization for postpartum hemorrhage. Despite selective
catheterization of the right uterine artery, the patient's known
large (approximately 2.4 cm) pseudoaneurysm within the right lower
uterine segment was suboptimally visualized, potentially indicative
of interval partial thrombosis.

Above findings discussed with Dr. Chiki Arias at the time of procedure
completion.

## 2016-07-05 ENCOUNTER — Encounter (HOSPITAL_COMMUNITY): Payer: Self-pay | Admitting: Emergency Medicine

## 2016-07-05 ENCOUNTER — Emergency Department (HOSPITAL_COMMUNITY)
Admission: EM | Admit: 2016-07-05 | Discharge: 2016-07-05 | Disposition: A | Discharge status: Home / Self Care | Payer: Medicaid Other | Attending: Emergency Medicine | Admitting: Emergency Medicine

## 2016-07-05 DIAGNOSIS — B9689 Other specified bacterial agents as the cause of diseases classified elsewhere: Secondary | ICD-10-CM

## 2016-07-05 DIAGNOSIS — Z711 Person with feared health complaint in whom no diagnosis is made: Secondary | ICD-10-CM

## 2016-07-05 DIAGNOSIS — R102 Pelvic and perineal pain: Secondary | ICD-10-CM

## 2016-07-05 DIAGNOSIS — B373 Candidiasis of vulva and vagina: Secondary | ICD-10-CM

## 2016-07-05 DIAGNOSIS — N760 Acute vaginitis: Secondary | ICD-10-CM

## 2016-07-05 DIAGNOSIS — B3731 Acute candidiasis of vulva and vagina: Secondary | ICD-10-CM

## 2016-07-05 DIAGNOSIS — F17210 Nicotine dependence, cigarettes, uncomplicated: Secondary | ICD-10-CM | POA: Insufficient documentation

## 2016-07-05 DIAGNOSIS — Z202 Contact with and (suspected) exposure to infections with a predominantly sexual mode of transmission: Secondary | ICD-10-CM | POA: Insufficient documentation

## 2016-07-05 DIAGNOSIS — I10 Essential (primary) hypertension: Secondary | ICD-10-CM | POA: Insufficient documentation

## 2016-07-05 LAB — URINALYSIS, ROUTINE W REFLEX MICROSCOPIC
BILIRUBIN URINE: NEGATIVE
GLUCOSE, UA: NEGATIVE mg/dL
Hgb urine dipstick: NEGATIVE
KETONES UR: NEGATIVE mg/dL
LEUKOCYTES UA: NEGATIVE
Nitrite: NEGATIVE
PROTEIN: NEGATIVE mg/dL
Specific Gravity, Urine: 1.004 — ABNORMAL LOW (ref 1.005–1.030)
pH: 5.5 (ref 5.0–8.0)

## 2016-07-05 LAB — COMPREHENSIVE METABOLIC PANEL
ALBUMIN: 4.4 g/dL (ref 3.5–5.0)
ALT: 15 U/L (ref 14–54)
ANION GAP: 7 (ref 5–15)
AST: 20 U/L (ref 15–41)
Alkaline Phosphatase: 58 U/L (ref 38–126)
BILIRUBIN TOTAL: 1.2 mg/dL (ref 0.3–1.2)
BUN: 15 mg/dL (ref 6–20)
CHLORIDE: 108 mmol/L (ref 101–111)
CO2: 23 mmol/L (ref 22–32)
Calcium: 9.6 mg/dL (ref 8.9–10.3)
Creatinine, Ser: 0.84 mg/dL (ref 0.44–1.00)
GFR calc Af Amer: >60 mL/min (ref >60)
GFR calc non Af Amer: >60 mL/min (ref >60)
GLUCOSE: 73 mg/dL (ref 65–99)
POTASSIUM: 3.9 mmol/L (ref 3.5–5.1)
SODIUM: 138 mmol/L (ref 135–145)
TOTAL PROTEIN: 7.5 g/dL (ref 6.5–8.1)

## 2016-07-05 LAB — CBC WITH DIFFERENTIAL/PLATELET
BASOS ABS: 0 10*3/uL (ref 0.0–0.1)
BASOS PCT: 0 %
EOS ABS: 0.1 10*3/uL (ref 0.0–0.7)
Eosinophils Relative: 1 %
HEMATOCRIT: 38.1 % (ref 36.0–46.0)
Hemoglobin: 12.5 g/dL (ref 12.0–15.0)
Lymphocytes Relative: 38 %
Lymphs Abs: 3.3 10*3/uL (ref 0.7–4.0)
MCH: 30 pg (ref 26.0–34.0)
MCHC: 32.8 g/dL (ref 30.0–36.0)
MCV: 91.6 fL (ref 78.0–100.0)
MONO ABS: 0.8 10*3/uL (ref 0.1–1.0)
Monocytes Relative: 9 %
NEUTROS ABS: 4.6 10*3/uL (ref 1.7–7.7)
NEUTROS PCT: 52 %
Platelets: 256 10*3/uL (ref 150–400)
RBC: 4.16 MIL/uL (ref 3.87–5.11)
RDW: 13 % (ref 11.5–15.5)
WBC: 8.8 10*3/uL (ref 4.0–10.5)

## 2016-07-05 LAB — PREGNANCY, URINE: Preg Test, Ur: NEGATIVE

## 2016-07-05 LAB — WET PREP, GENITAL
SPERM: NONE SEEN
TRICH WET PREP: NONE SEEN

## 2016-07-05 LAB — GC/CHLAMYDIA PROBE AMP (~~LOC~~) NOT AT ARMC
Chlamydia: POSITIVE — AB
NEISSERIA GONORRHEA: NEGATIVE

## 2016-07-05 MED ORDER — FLUCONAZOLE 150 MG PO TABS: 150.0000 mg | ORAL_TABLET | Freq: Once | ORAL | 0 refills | Status: AC

## 2016-07-05 MED ORDER — LIDOCAINE HCL (PF) 1 % IJ SOLN
INTRAMUSCULAR | Status: AC
  Administered 2016-07-05: 0.9 mL
  Filled 2016-07-05: qty 5

## 2016-07-05 MED ORDER — OXYCODONE-ACETAMINOPHEN 5-325 MG PO TABS
1.0000 | ORAL_TABLET | Freq: Once | ORAL | Status: AC
  Administered 2016-07-05: 1 via ORAL
  Filled 2016-07-05: qty 1

## 2016-07-05 MED ORDER — AZITHROMYCIN 250 MG PO TABS
1000.0000 mg | ORAL_TABLET | Freq: Once | ORAL | Status: AC
  Administered 2016-07-05: 1000 mg via ORAL
  Filled 2016-07-05: qty 4

## 2016-07-05 MED ORDER — NAPROXEN 250 MG PO TABS: 250.0000 mg | ORAL_TABLET | Freq: Two times a day (BID) | ORAL | 0 refills | Status: DC

## 2016-07-05 MED ORDER — LIDOCAINE HCL (PF) 1 % IJ SOLN
5.0000 mL | Freq: Once | INTRAMUSCULAR | Status: AC
  Administered 2016-07-05: 0.9 mL

## 2016-07-05 MED ORDER — CEFTRIAXONE SODIUM 250 MG IJ SOLR
250.0000 mg | Freq: Once | INTRAMUSCULAR | Status: AC
  Administered 2016-07-05: 250 mg via INTRAMUSCULAR
  Filled 2016-07-05: qty 250

## 2016-07-05 MED ORDER — METRONIDAZOLE 500 MG PO TABS: 500.0000 mg | ORAL_TABLET | Freq: Two times a day (BID) | ORAL | 0 refills | Status: DC

## 2016-07-05 NOTE — ED Triage Notes (Signed)
Pt. reports chronic pelvic pain since February with vaginal discharge , denies fever or chills , no emesis or diarrhea .

## 2016-07-05 NOTE — ED Provider Notes (Signed)
[facility]-EMERGENCY DEPT Provider Note   CSN: [id number] Arrival date & time: 07/05/16  0114     History   Chief Complaint Chief Complaint  Patient presents with  . Pelvic Pain  . Vaginal Discharge    HPI Stacy Moss is a 25 y.o. female.  Stacy Moss is a 25 y.o. Female who presents to the ED complaining of vaginal discharge ongoing for several months and chronic ongoing pelvic pain for several months. Patient reports she had a partial hysterectomy in November 2016. She reports since then she's had chronic pelvic pain. She reports pain with sex and with working out. She has not followed up with her OB/GYN about this. The pain is chronic and ongoing. It is not changed or worsened recently. She also reports vaginal discharge for several months. She is sexually active and does not use protection. She denies urinary symptoms. No fevers. No treatments prior to arrival. Patient denies fevers, urinary symptoms, hematuria, vaginal bleeding, nausea, vomiting, diarrhea, coughing, shortness of breath or rashes.   The history is provided by the patient. No language interpreter was used.  Pelvic Pain  Pertinent negatives include no chest pain, no abdominal pain, no headaches and no shortness of breath.  Vaginal Discharge   Pertinent negatives include no fever, no abdominal pain, no diarrhea, no nausea, no vomiting, no dysuria and no frequency.    Past Medical History:  Diagnosis Date  . ADD (attention deficit disorder)   . Anxiety   . Bipolar affective (HCC)   . Depression   . History of cocaine use 10/2014  . History of gestational diabetes mellitus (GDM)    with prior pregnancy  . History of marijuana use   . History of suicidal ideation   . History of VBAC   . Hx of eye surgery 02/2015  . Hx of tonsillectomy   . Hypertension    on meds 2011  . Mental disorder   . Polysubstance abuse 10/2014   Ectasy use  . S/P cesarean section 05/25/2012  . S/p supracervical hysterectomy and  bilateral salpingectomy on 09/11/15 09/11/2015  . Seizures [facility])    age [age]- after fell and hit head  . Smoker     Patient Active Problem List   Diagnosis Date Noted  . Uterine rupture during labor 09/11/2015  . S/p supracervical hysterectomy and bilateral salpingectomy on 09/11/15 09/11/2015  . Acute blood loss anemia 09/01/2015  . Delayed postpartum hemorrhage x 3 episodes 08/16/2015  . S/P emergency cesarean section for uterine rupture on 08/11/15 08/11/2015  . 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 History of suicidal ideation 03/15/2015  . Chest pain 03/14/2015  . Intermittent palpitations 03/14/2015  . Syncopal episodes 03/09/2015    Past Surgical History:  Procedure Laterality Date  . ABDOMINAL HYSTERECTOMY N/A 09/11/2015   Procedure: SUPRACERVICAL ABDONIMAL HYSTERECTOMY, ;  Surgeon: Tereso Newcomer, MD;  Location: [facility] ORS;  Service: Gynecology;  Laterality: N/A;  . BILATERAL SALPINGECTOMY  09/11/2015   Procedure: BILATERAL SALPINGECTOMY;  Surgeon: Tereso Newcomer, MD;  Location: [facility] ORS;  Service: Gynecology;;  . CESAREAN SECTION  05/25/2012   Procedure: CESAREAN SECTION;  Surgeon: Sherron Monday, MD;  Location: [facility] ORS;  Service: Gynecology;  Laterality: N/A;  Primary cesarean section with delivery of baby girl at 0700. Apgars9/9.  Marland Kitchen CESAREAN SECTION N/A 08/11/2015   Procedure: CESAREAN SECTION;  Surgeon: Lazaro Arms, MD;  Location: [facility] ORS;  Service: Obstetrics;  Laterality: N/A;  . ORIF ORBITAL FRACTURE  Left 02/25/2015   Procedure: OPEN REDUCTION INTERNAL FIXATION (ORIF) LEFT ORBITAL FRACTURE;  Surgeon: Melvenia Beam, MD;  Location: [facility];  Service: ENT;  Laterality: Left;  . TONSILLECTOMY      OB History    Gravida Para Term Preterm AB Living   5 3 3  0 2 3   SAB TAB Ectopic Multiple Live Births   2 0 0 0 3       Home Medications    Prior to Admission medications   Medication Sig Start Date End Date Taking? Authorizing Provider    dicyclomine (BENTYL) 20 MG tablet Take 1 tablet (20 mg total) by mouth 3 (three) times daily before meals. As needed for abdominal pain 11/24/15   Kristen N Ward, DO  fluconazole (DIFLUCAN) 150 MG tablet Take 1 tablet (150 mg total) by mouth once. 07/05/16 07/05/16  Everlene Farrier, PA-C  metroNIDAZOLE (FLAGYL) 500 MG tablet Take 1 tablet (500 mg total) by mouth 2 (two) times daily. 07/05/16   Everlene Farrier, PA-C  naproxen (NAPROSYN) 250 MG tablet Take 1 tablet (250 mg total) by mouth 2 (two) times daily with a meal. 07/05/16   Everlene Farrier, PA-C  ondansetron (ZOFRAN ODT) 4 MG disintegrating tablet Take 1 tablet (4 mg total) by mouth every 8 (eight) hours as needed for nausea or vomiting. 11/24/15   Kristen N Ward, DO  pantoprazole (PROTONIX) 40 MG tablet Take 1 tablet (40 mg total) by mouth daily. 11/24/15   Layla Maw Ward, DO    Family History Family History  Problem Relation Age of Onset  . Cancer Maternal Grandmother   . Hypertension Maternal Grandmother   . Hypertension Mother   . Anesthesia problems Neg Hx     Social History Social History  Substance Use Topics  . Smoking status: Current Every Day Smoker    Packs/day: 0.25    Years: 10.00    Types: Cigarettes  . Smokeless tobacco: Never Used  . Alcohol use No     Comment: Former EtOH abuse     Allergies   Review of patient's allergies indicates no known allergies.   Review of Systems Review of Systems  Constitutional: Negative for chills and fever.  HENT: Negative for congestion and sore throat.   Eyes: Negative for visual disturbance.  Respiratory: Negative for cough and shortness of breath.   Cardiovascular: Negative for chest pain.  Gastrointestinal: Negative for abdominal pain, diarrhea, nausea and vomiting.  Genitourinary: Positive for pelvic pain and vaginal discharge. Negative for difficulty urinating, dysuria, flank pain, frequency, hematuria and vaginal bleeding.  Musculoskeletal: Negative for back pain and neck  pain.  Skin: Negative for rash.  Neurological: Negative for headaches.     Physical Exam Updated Vital Signs BP 136/81 (BP Location: Right Arm)   Pulse 81   Temp 98 F (36.7 C) (Oral)   Resp 17   Ht 5\' 9"  (1.753 m)   Wt 85.3 kg   LMP 10/22/2014 (Approximate)   SpO2 100%   BMI 27.78 kg/m   Physical Exam  Constitutional: She appears well-developed and well-nourished. No distress.  Nontoxic appearing.  HENT:  Head: Normocephalic and atraumatic.  Mouth/Throat: Oropharynx is clear and moist.  Eyes: Conjunctivae are normal. Pupils are equal, round, and reactive to light. Right eye exhibits no discharge. Left eye exhibits no discharge.  Neck: Neck supple.  Cardiovascular: Normal rate, regular rhythm, normal heart sounds and intact distal pulses.  Exam reveals no gallop and no friction rub.   No murmur heard.  Pulmonary/Chest: Effort normal and breath sounds normal. No respiratory distress. She has no wheezes. She has no rales.  Abdominal: Soft. Bowel sounds are normal. She exhibits no distension and no mass. There is tenderness. There is no rebound and no guarding.  Abdomen is soft. Bowel sounds are present. Patient has mild suprapubic abdominal tenderness to palpation. No peritoneal signs. No CVA or flank tenderness.  Genitourinary: Vaginal discharge found.  Genitourinary Comments: Pelvic exam performed by me with female nurse tech as chaperone. Mild amount of white vaginal discharge noted. Cervix is surgically absent. Only mild suprapubic tenderness to palpation. No adnexal tenderness or fullness. No vaginal bleeding. No external rashes or lesions.  Musculoskeletal: She exhibits no edema.  Lymphadenopathy:    She has no cervical adenopathy.  Neurological: She is alert. Coordination normal.  Skin: Skin is warm and dry. Capillary refill takes less than 2 seconds. No rash noted. She is not diaphoretic. No erythema. No pallor.  Psychiatric: She has a normal mood and affect. Her behavior  is normal.  Nursing note and vitals reviewed.    ED Treatments / Results  Labs (all labs ordered are listed, but only abnormal results are displayed) Labs Reviewed  WET PREP, GENITAL - Abnormal; Notable for the following:       Result Value   Yeast Wet Prep HPF POC PRESENT (*)    Clue Cells Wet Prep HPF POC PRESENT (*)    WBC, Wet Prep HPF POC MANY (*)    All other components within normal limits  URINALYSIS, ROUTINE W REFLEX MICROSCOPIC (NOT AT [facility]) - Abnormal; Notable for the following:    Specific Gravity, Urine 1.004 (*)    All other components within normal limits  CBC WITH DIFFERENTIAL/PLATELET  COMPREHENSIVE METABOLIC PANEL  PREGNANCY, URINE  GC/CHLAMYDIA PROBE AMP ([place]) NOT AT [facility]    EKG  EKG Interpretation None       Radiology No results found.  Procedures Procedures (including critical care time)  Medications Ordered in ED Medications  cefTRIAXone (ROCEPHIN) injection 250 mg (not administered)  azithromycin (ZITHROMAX) tablet 1,000 mg (not administered)  lidocaine (PF) (XYLOCAINE) 1 % injection (not administered)     Initial Impression / Assessment and Plan / ED Course  I have reviewed the triage vital signs and the nursing notes.  Pertinent labs & imaging results that were available during my care of the patient were reviewed by me and considered in my medical decision making (see chart for details).  Clinical Course   This is a 25 y.o. Female who presents to the ED complaining of vaginal discharge ongoing for several months and chronic ongoing pelvic pain for several months. Patient reports she had a partial hysterectomy in November 2016. She reports since then she's had chronic pelvic pain. She reports pain with sex and with working out. She has not followed up with her OB/GYN about this. The pain is chronic and ongoing. It is not changed or worsened recently. She also reports vaginal discharge for several months. She is sexually active and  does not use protection. She denies urinary symptoms. No fevers. On exam the patient is afebrile nontoxic appearing. Her abdomen is soft and she is mild suprapubic abdominal tenderness to palpation. No peritoneal signs. On pelvic exam she does have white vaginal discharge. Cervix is surgically absent. She has mild suprapubic tenderness to palpation. CBC and CMP are unremarkable. Urinalysis is nitrite and leukocyte negative. Wet prep returned positive for yeast, clue cells and many white blood cells.  We'll treat for gonorrhea and chlamydia with Rocephin and azithromycin. Will discharge with prescriptions for Diflucan and Flagyl. Naproxen for her pelvic pain. As the patient has had many months of ongoing chronic pelvic pain I see no need for emergent imaging at this time. Her pain is ongoing and has not worsened or changed recently. I encouraged her to follow-up with the women's outpatient clinic for further. I encouraged her to follow-up on her STD testing in about 3 days. I discussed safe sex practices. I discussed return precautions. I advised the patient to follow-up with their primary care provider this week. I advised the patient to return to the emergency department with new or worsening symptoms or new concerns. The patient verbalized understanding and agreement with plan.      Final Clinical Impressions(s) / ED Diagnoses   Final diagnoses:  BV (bacterial vaginosis)  Pelvic pain in female  Vaginal yeast infection  Concern about STD in female without diagnosis    New Prescriptions New Prescriptions   FLUCONAZOLE (DIFLUCAN) 150 MG TABLET    Take 1 tablet (150 mg total) by mouth once.   METRONIDAZOLE (FLAGYL) 500 MG TABLET    Take 1 tablet (500 mg total) by mouth 2 (two) times daily.   NAPROXEN (NAPROSYN) 250 MG TABLET    Take 1 tablet (250 mg total) by mouth 2 (two) times daily with a meal.     Everlene FarrierWilliam Lacee Grey, PA-C 07/05/16 [id number]    Loren Raceravid Yelverton, MD 07/05/16 [phone]

## 2016-07-05 NOTE — ED Notes (Signed)
Patient given a bus pass. 

## 2016-07-09 ENCOUNTER — Telehealth (HOSPITAL_BASED_OUTPATIENT_CLINIC_OR_DEPARTMENT_OTHER): Payer: Self-pay | Admitting: Emergency Medicine

## 2016-11-17 ENCOUNTER — Emergency Department (HOSPITAL_COMMUNITY)
Admission: EM | Admit: 2016-11-17 | Discharge: 2016-11-18 | Disposition: A | Discharge status: Home / Self Care | Payer: Self-pay | Attending: Emergency Medicine | Admitting: Emergency Medicine

## 2016-11-17 ENCOUNTER — Encounter (HOSPITAL_COMMUNITY): Payer: Self-pay

## 2016-11-17 ENCOUNTER — Emergency Department (HOSPITAL_COMMUNITY): Payer: Self-pay

## 2016-11-17 DIAGNOSIS — F17210 Nicotine dependence, cigarettes, uncomplicated: Secondary | ICD-10-CM | POA: Insufficient documentation

## 2016-11-17 DIAGNOSIS — F909 Attention-deficit hyperactivity disorder, unspecified type: Secondary | ICD-10-CM | POA: Insufficient documentation

## 2016-11-17 DIAGNOSIS — I10 Essential (primary) hypertension: Secondary | ICD-10-CM | POA: Insufficient documentation

## 2016-11-17 DIAGNOSIS — R102 Pelvic and perineal pain: Secondary | ICD-10-CM | POA: Insufficient documentation

## 2016-11-17 LAB — URINALYSIS, ROUTINE W REFLEX MICROSCOPIC
Bilirubin Urine: NEGATIVE
Glucose, UA: NEGATIVE mg/dL
Hgb urine dipstick: NEGATIVE
Ketones, ur: NEGATIVE mg/dL
LEUKOCYTES UA: NEGATIVE
NITRITE: NEGATIVE
PROTEIN: NEGATIVE mg/dL
Specific Gravity, Urine: 1.013 (ref 1.005–1.030)
pH: 8 (ref 5.0–8.0)

## 2016-11-17 LAB — COMPREHENSIVE METABOLIC PANEL
ALT: 14 U/L (ref 14–54)
AST: 19 U/L (ref 15–41)
Albumin: 4.4 g/dL (ref 3.5–5.0)
Alkaline Phosphatase: 53 U/L (ref 38–126)
Anion gap: 6 (ref 5–15)
BILIRUBIN TOTAL: 0.6 mg/dL (ref 0.3–1.2)
BUN: 17 mg/dL (ref 6–20)
CHLORIDE: 105 mmol/L (ref 101–111)
CO2: 27 mmol/L (ref 22–32)
Calcium: 9.2 mg/dL (ref 8.9–10.3)
Creatinine, Ser: 0.51 mg/dL (ref 0.44–1.00)
Glucose, Bld: 88 mg/dL (ref 65–99)
Potassium: 3.8 mmol/L (ref 3.5–5.1)
Sodium: 138 mmol/L (ref 135–145)
TOTAL PROTEIN: 7.4 g/dL (ref 6.5–8.1)

## 2016-11-17 LAB — WET PREP, GENITAL
Clue Cells Wet Prep HPF POC: NONE SEEN
SPERM: NONE SEEN
TRICH WET PREP: NONE SEEN
YEAST WET PREP: NONE SEEN

## 2016-11-17 LAB — CBC
HCT: 36.3 % (ref 36.0–46.0)
Hemoglobin: 11.9 g/dL — ABNORMAL LOW (ref 12.0–15.0)
MCH: 30.1 pg (ref 26.0–34.0)
MCHC: 32.8 g/dL (ref 30.0–36.0)
MCV: 91.7 fL (ref 78.0–100.0)
Platelets: 228 10*3/uL (ref 150–400)
RBC: 3.96 MIL/uL (ref 3.87–5.11)
RDW: 12.7 % (ref 11.5–15.5)
WBC: 9.9 10*3/uL (ref 4.0–10.5)

## 2016-11-17 LAB — I-STAT BETA HCG BLOOD, ED (MC, WL, AP ONLY)

## 2016-11-17 LAB — LIPASE, BLOOD: LIPASE: 23 U/L (ref 11–51)

## 2016-11-17 MED ORDER — IOPAMIDOL (ISOVUE-300) INJECTION 61%
100.0000 mL | Freq: Once | INTRAVENOUS | Status: AC | PRN
  Administered 2016-11-17: 100 mL via INTRAVENOUS

## 2016-11-17 MED ORDER — ONDANSETRON 4 MG PO TBDP
4.0000 mg | ORAL_TABLET | Freq: Once | ORAL | Status: AC | PRN
  Administered 2016-11-17: 4 mg via ORAL
  Filled 2016-11-17: qty 1

## 2016-11-17 MED ORDER — IOPAMIDOL (ISOVUE-300) INJECTION 61%
INTRAVENOUS | Status: AC
  Filled 2016-11-17: qty 100

## 2016-11-17 MED ORDER — MORPHINE SULFATE (PF) 4 MG/ML IV SOLN
4.0000 mg | Freq: Once | INTRAVENOUS | Status: AC
  Administered 2016-11-17: 4 mg via INTRAVENOUS
  Filled 2016-11-17: qty 1

## 2016-11-17 NOTE — ED Notes (Signed)
Patient unhappy with plan of care. Patient given patient experience number. Charge RN at bedside speaking with patient.

## 2016-11-17 NOTE — ED Notes (Signed)
Patient requesting pain medication. MD made aware. 

## 2016-11-17 NOTE — ED Notes (Signed)
ED Provider at bedside. 

## 2016-11-17 NOTE — ED Notes (Signed)
Patient transported to CT 

## 2016-11-17 NOTE — ED Provider Notes (Signed)
[facility]-EMERGENCY DEPT Provider Note   CSN: [id number] Arrival date & time: 11/17/16  1649     History   Chief Complaint Chief Complaint  Patient presents with  . Abdominal Pain  . Emesis    HPI Stacy Moss is a 26 y.o. female.  HPI Patient presents with one week of lower abdominal pain. States that it was episodic but has become constant over the last few days. States she's having movements and difficulty passing stool. Denies nausea or vomiting. No fever or chills. No vaginal discharge or bleeding. Denies dysuria, frequency or urgency. Past Medical History:  Diagnosis Date  . ADD (attention deficit disorder)   . Anxiety   . Bipolar affective (HCC)   . Depression   . History of cocaine use 10/2014  . History of gestational diabetes mellitus (GDM)    with prior pregnancy  . History of marijuana use   . History of suicidal ideation   . History of VBAC   . Hx of eye surgery 02/2015  . Hx of tonsillectomy   . Hypertension    on meds 2011  . Mental disorder   . Polysubstance abuse 10/2014   Ectasy use  . S/P cesarean section 05/25/2012  . S/p supracervical hysterectomy and bilateral salpingectomy on 09/11/15 09/11/2015  . Seizures [facility])    age [age]- after fell and hit head  . Smoker     Patient Active Problem List   Diagnosis Date Noted  . Uterine rupture during labor 09/11/2015  . S/p supracervical hysterectomy and bilateral salpingectomy on 09/11/15 09/11/2015  . Acute blood loss anemia 09/01/2015  . Delayed postpartum hemorrhage x 3 episodes 08/16/2015  . S/P emergency cesarean section for uterine rupture on 08/11/15 08/11/2015  . 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 History of suicidal ideation 03/15/2015  . Chest pain 03/14/2015  . Intermittent palpitations 03/14/2015  . Syncopal episodes 03/09/2015    Past Surgical History:  Procedure Laterality Date  . ABDOMINAL HYSTERECTOMY N/A 09/11/2015   Procedure: SUPRACERVICAL  ABDONIMAL HYSTERECTOMY, ;  Surgeon: Tereso Newcomer, MD;  Location: [facility] ORS;  Service: Gynecology;  Laterality: N/A;  . BILATERAL SALPINGECTOMY  09/11/2015   Procedure: BILATERAL SALPINGECTOMY;  Surgeon: Tereso Newcomer, MD;  Location: [facility];  Service: Gynecology;;  . CESAREAN SECTION  05/25/2012   Procedure: CESAREAN SECTION;  Surgeon: Sherron Monday, MD;  Location: [facility] ORS;  Service: Gynecology;  Laterality: N/A;  Primary cesarean section with delivery of baby girl at 0700. Apgars9/9.  Marland Kitchen CESAREAN SECTION N/A 08/11/2015   Procedure: CESAREAN SECTION;  Surgeon: Lazaro Arms, MD;  Location: [facility];  Service: Obstetrics;  Laterality: N/A;  . ORIF ORBITAL FRACTURE Left 02/25/2015   Procedure: OPEN REDUCTION INTERNAL FIXATION (ORIF) LEFT ORBITAL FRACTURE;  Surgeon: Melvenia Beam, MD;  Location: [facility] OR;  Service: ENT;  Laterality: Left;  . TONSILLECTOMY      OB History    Gravida Para Term Preterm AB Living   5 3 3  0 2 3   SAB TAB Ectopic Multiple Live Births   2 0 0 0 3       Home Medications    Prior to Admission medications   Medication Sig Start Date End Date Taking? Authorizing Provider  ibuprofen (ADVIL,MOTRIN) 200 MG tablet Take 800 mg by mouth every 6 (six) hours as needed for moderate pain.   Yes Historical Provider, MD  HYDROcodone-acetaminophen (NORCO) 5-325 MG tablet Take 1-2 tablets by mouth every 4 (four) hours  as needed for severe pain. 11/18/16   Loren Racer, MD  metroNIDAZOLE (FLAGYL) 500 MG tablet Take 1 tablet (500 mg total) by mouth 2 (two) times daily. Patient not taking: Reported on 11/17/2016 07/05/16   Everlene Farrier, PA-C  naproxen (NAPROSYN) 250 MG tablet Take 1 tablet (250 mg total) by mouth 2 (two) times daily with a meal. Patient not taking: Reported on 11/17/2016 07/05/16   Everlene Farrier, PA-C  pantoprazole (PROTONIX) 40 MG tablet Take 1 tablet (40 mg total) by mouth daily. Patient not taking: Reported on 11/17/2016 11/24/15   Layla Maw Ward, DO    Family  History Family History  Problem Relation Age of Onset  . Cancer Maternal Grandmother   . Hypertension Maternal Grandmother   . Hypertension Mother   . Anesthesia problems Neg Hx     Social History Social History  Substance Use Topics  . Smoking status: Current Every Day Smoker    Packs/day: 0.25    Years: 10.00    Types: Cigarettes  . Smokeless tobacco: Never Used  . Alcohol use No     Comment: Former EtOH abuse     Allergies   Patient has no known allergies.   Review of Systems Review of Systems  Constitutional: Negative for chills and fever.  Respiratory: Negative for cough and shortness of breath.   Cardiovascular: Negative for chest pain.  Gastrointestinal: Positive for abdominal pain and constipation. Negative for abdominal distention, blood in stool, nausea and vomiting.  Genitourinary: Negative for flank pain, frequency, hematuria, pelvic pain, urgency, vaginal bleeding and vaginal discharge.  Musculoskeletal: Positive for back pain and myalgias. Negative for neck pain and neck stiffness.  Skin: Negative for pallor and rash.  Neurological: Negative for dizziness, weakness, light-headedness, numbness and headaches.  All other systems reviewed and are negative.    Physical Exam Updated Vital Signs BP 158/93   Pulse 66   Temp 97.5 F (36.4 C) (Oral)   Resp 16   LMP 10/22/2014 (Approximate)   SpO2 99%   Physical Exam  Constitutional: She is oriented to person, place, and time. She appears well-developed and well-nourished.  HENT:  Head: Normocephalic and atraumatic.  Mouth/Throat: Oropharynx is clear and moist.  Eyes: EOM are normal. Pupils are equal, round, and reactive to light.  Neck: Normal range of motion. Neck supple.  Cardiovascular: Normal rate and regular rhythm.   Pulmonary/Chest: Effort normal and breath sounds normal.  Abdominal: Soft. Bowel sounds are normal. She exhibits distension. There is tenderness (mild diffuse lower abdominal  tenderness to palpation. No rebound or guarding.). There is no rebound and no guarding.  Genitourinary:  Genitourinary Comments: White vaginal discharge. Left adnexal tenderness with palpation. No definite cervical motion tenderness.  Musculoskeletal: Normal range of motion. She exhibits no edema or tenderness.  Bilateral lumbar paraspinal tenderness to palpation. No CVA tenderness.  Neurological: She is alert and oriented to person, place, and time.  Skin: Skin is warm and dry. No rash noted. No erythema.  Psychiatric: She has a normal mood and affect. Her behavior is normal.  Nursing note and vitals reviewed.    ED Treatments / Results  Labs (all labs ordered are listed, but only abnormal results are displayed) Labs Reviewed  WET PREP, GENITAL - Abnormal; Notable for the following:       Result Value   WBC, Wet Prep HPF POC FEW (*)    All other components within normal limits  CBC - Abnormal; Notable for the following:    Hemoglobin  11.9 (*)    All other components within normal limits  URINALYSIS, ROUTINE W REFLEX MICROSCOPIC - Abnormal; Notable for the following:    Color, Urine STRAW (*)    All other components within normal limits  LIPASE, BLOOD  COMPREHENSIVE METABOLIC PANEL  I-STAT BETA HCG BLOOD, ED ([facility], [facility], [facility] ONLY)  GC/CHLAMYDIA PROBE AMP ([place]) NOT AT [facility]    EKG  EKG Interpretation None       Radiology Dg Abdomen 1 View  Result Date: 11/17/2016 CLINICAL DATA:  Abdominal pain generalized with nausea and vomiting 1 week. EXAM: ABDOMEN - 1 VIEW COMPARISON:  11/23/2015 FINDINGS: Bowel gas pattern is nonobstructive. No evidence of free peritoneal air. No mass or mass effect. Bones soft tissues are within normal. IMPRESSION: Negative. Electronically Signed   By: Elberta Fortisaniel  Boyle M.D.   On: 11/17/2016 21:14   Ct Abdomen Pelvis W Contrast  Result Date: 11/18/2016 CLINICAL DATA:  Initial valuation for acute lower abdominal pain. EXAM: CT ABDOMEN AND PELVIS WITH  CONTRAST TECHNIQUE: Multidetector CT imaging of the abdomen and pelvis was performed using the standard protocol following bolus administration of intravenous contrast. CONTRAST:  100mL ISOVUE-300 IOPAMIDOL (ISOVUE-300) INJECTION 61% COMPARISON:  Prior radiograph from earlier the same day. FINDINGS: Lower chest: Minimal atelectatic changes within the visualized left lung base. Visualized lungs are otherwise clear. Hepatobiliary: Liver demonstrates a normal contrast enhanced appearance. Gallbladder within normal limits. No biliary dilatation. Pancreas: Pancreas within normal limits. Spleen: Spleen within normal limits. Adrenals/Urinary Tract: Adrenal glands are normal. Kidneys equal in size with symmetric enhancement. No nephrolithiasis, hydronephrosis, or focal enhancing renal mass. No hydroureter. Partially distended bladder within normal limits. Stomach/Bowel: Stomach unremarkable. No evidence for bowel obstruction. Appendix visualized within the right lower quadrant and is of normal caliber and appearance associated inflammatory changes to suggest acute appendicitis. No abnormal wall thickening, mucosal enhancement, or inflammatory fat stranding seen about the bowels. Vascular/Lymphatic: Normal intravascular enhancement seen throughout the intra-abdominal aorta and its branch vessels. No adenopathy. Reproductive: Uterus is absent. Left ovary unremarkable. There is a 4.3 x 4.4 x 3.6 cm cystic lesion within the right at adnexa, likely an ovarian cyst. Possible faint internal septation noted. Other: The the no free air or fluid. Musculoskeletal: No acute osseous abnormality. No worrisome lytic or blastic osseous lesions. IMPRESSION: 1. 4.3 x 4.4 x 3.6 cm right ovarian cyst with possible faint internal septation. A follow-up ultrasound in 6-12 weeks is suggested to ensure resolution. Query this as source of lower abdominal pain. 2. No other acute intra-abdominal or pelvic process identified. 3. Normal appendix.  Electronically Signed   By: Rise MuBenjamin  McClintock M.D.   On: 11/18/2016 00:07    Procedures Procedures (including critical care time)  Medications Ordered in ED Medications  iopamidol (ISOVUE-300) 61 % injection (not administered)  ondansetron (ZOFRAN-ODT) disintegrating tablet 4 mg (4 mg Oral Given 11/17/16 1730)  morphine 4 MG/ML injection 4 mg (4 mg Intravenous Given 11/17/16 2206)  iopamidol (ISOVUE-300) 61 % injection 100 mL (100 mLs Intravenous Contrast Given 11/17/16 2337)  morphine 4 MG/ML injection 4 mg (4 mg Intravenous Given 11/17/16 2348)     Initial Impression / Assessment and Plan / ED Course  I have reviewed the triage vital signs and the nursing notes.  Pertinent labs & imaging results that were available during my care of the patient were reviewed by me and considered in my medical decision making (see chart for details).     Cystic structure adjacent to the right ovary.  Normal infectious markers. Radiologist's thinks likely ovarian cyst. Will treat symptomatically and patient is advised to follow-up with OB/GYN. Return precautions given.  Final Clinical Impressions(s) / ED Diagnoses   Final diagnoses:  Pelvic pain in female    New Prescriptions New Prescriptions   HYDROCODONE-ACETAMINOPHEN (NORCO) 5-325 MG TABLET    Take 1-2 tablets by mouth every 4 (four) hours as needed for severe pain.     Loren Racer, MD 11/18/16 [phone]

## 2016-11-17 NOTE — ED Notes (Signed)
Pt spoke w/ Consulting civil engineerCharge RN and verified that this writer was correct about medication that could be provided.  Pt, then, decided to stay and be seen.

## 2016-11-17 NOTE — ED Notes (Signed)
Pt asking for pain medication.  Pt offered 400mg  ibuprofen and refused.  Pt asked to be "taken out of the system".  [facility]

## 2016-11-17 NOTE — ED Notes (Signed)
Patient transported to X-ray 

## 2016-11-17 NOTE — ED Triage Notes (Signed)
Pt arrived via EMS with N/V and increasing abd pain over the last week that is now 10/10.

## 2016-11-18 MED ORDER — HYDROCODONE-ACETAMINOPHEN 5-325 MG PO TABS: 1.0000 | ORAL_TABLET | ORAL | 0 refills | Status: DC | PRN

## 2016-11-19 ENCOUNTER — Telehealth: Payer: Self-pay | Admitting: *Deleted

## 2016-11-19 NOTE — Telephone Encounter (Addendum)
Pt left message stating that she was seen @ the hospital and told she has a large cyst on her ovary. She states she was given Vicodin for pain and is supposed to be scheduled for appt for removal of the cyst. She wants the appt ASAP and does not want to keep taking pain medicine. Per chart review, [facility]/Gyn. The CT result states recommendation for follow up ultrasound in 6-12 weeks to ensure resolution of the ovarian cyst.   1545  I called pt and left message stating that I am returning her call. Please call back and leave a message stating whether a detailed message and medical advice can be left on her voice mail. Pt should be advised that there is no current plan for removal of the cyst as these most likely will resolve spontaneously. Pt could have pelvic and transvag US in 6-12 weeks per radiologist recommendation or schedule appt in office to see a provider for further discussion.

## 2016-11-20 LAB — GC/CHLAMYDIA PROBE AMP (~~LOC~~) NOT AT ARMC
Chlamydia: NEGATIVE
Neisseria Gonorrhea: NEGATIVE

## 2016-11-21 NOTE — Telephone Encounter (Signed)
Called patient, no answer- left message to call us back if you still need assistance.  

## 2016-11-22 ENCOUNTER — Telehealth: Payer: Self-pay

## 2016-11-22 NOTE — Telephone Encounter (Signed)
Pt called and stated she is returning a call and that we are permitted to leave a message and that "it is getting bigger".  I called the pt and pt stated "if I would have just gotten everything taken instead of leaving my my ovaries I would not be dealing with this ovary getting bigger and causing me pain."  I explained to the pt that she needed to come in the office to have a discussion with a provider in regards to her management.  I also advised that the use of the ER is a band-aid and that she really needs to speak with a provider.  Pt stated that she understood.  I informed pt that the front office will call her with an appt. Pt had no further questions.

## 2017-06-07 ENCOUNTER — Encounter: Payer: Medicaid Other | Admitting: Obstetrics & Gynecology

## 2017-07-01 ENCOUNTER — Encounter (HOSPITAL_COMMUNITY): Payer: Self-pay

## 2017-07-01 ENCOUNTER — Inpatient Hospital Stay (HOSPITAL_COMMUNITY)
Admission: EM | Admit: 2017-07-01 | Discharge: 2017-07-08 | DRG: 917 | Disposition: A | Discharge status: Other Acute Inpatient Hospital | Payer: Self-pay | Attending: Internal Medicine | Admitting: Internal Medicine

## 2017-07-01 DIAGNOSIS — R400 Somnolence: Secondary | ICD-10-CM | POA: Diagnosis present

## 2017-07-01 DIAGNOSIS — T405X2A Poisoning by cocaine, intentional self-harm, initial encounter: Secondary | ICD-10-CM | POA: Diagnosis present

## 2017-07-01 DIAGNOSIS — Y92009 Unspecified place in unspecified non-institutional (private) residence as the place of occurrence of the external cause: Secondary | ICD-10-CM

## 2017-07-01 DIAGNOSIS — T1491XA Suicide attempt, initial encounter: Secondary | ICD-10-CM

## 2017-07-01 DIAGNOSIS — T407X2A Poisoning by cannabis (derivatives), intentional self-harm, initial encounter: Secondary | ICD-10-CM | POA: Diagnosis present

## 2017-07-01 DIAGNOSIS — R05 Cough: Secondary | ICD-10-CM

## 2017-07-01 DIAGNOSIS — F1410 Cocaine abuse, uncomplicated: Secondary | ICD-10-CM | POA: Diagnosis present

## 2017-07-01 DIAGNOSIS — G92 Toxic encephalopathy: Secondary | ICD-10-CM | POA: Diagnosis present

## 2017-07-01 DIAGNOSIS — F332 Major depressive disorder, recurrent severe without psychotic features: Secondary | ICD-10-CM | POA: Diagnosis present

## 2017-07-01 DIAGNOSIS — F1210 Cannabis abuse, uncomplicated: Secondary | ICD-10-CM | POA: Diagnosis present

## 2017-07-01 DIAGNOSIS — T50902A Poisoning by unspecified drugs, medicaments and biological substances, intentional self-harm, initial encounter: Secondary | ICD-10-CM

## 2017-07-01 DIAGNOSIS — I4581 Long QT syndrome: Secondary | ICD-10-CM | POA: Diagnosis present

## 2017-07-01 DIAGNOSIS — T43592A Poisoning by other antipsychotics and neuroleptics, intentional self-harm, initial encounter: Principal | ICD-10-CM | POA: Diagnosis present

## 2017-07-01 DIAGNOSIS — Z8249 Family history of ischemic heart disease and other diseases of the circulatory system: Secondary | ICD-10-CM

## 2017-07-01 DIAGNOSIS — R058 Other specified cough: Secondary | ICD-10-CM

## 2017-07-01 DIAGNOSIS — F17210 Nicotine dependence, cigarettes, uncomplicated: Secondary | ICD-10-CM | POA: Diagnosis present

## 2017-07-01 DIAGNOSIS — T43502S Poisoning by unspecified antipsychotics and neuroleptics, intentional self-harm, sequela: Secondary | ICD-10-CM

## 2017-07-01 DIAGNOSIS — R000 Tachycardia, unspecified: Secondary | ICD-10-CM | POA: Diagnosis present

## 2017-07-01 DIAGNOSIS — F1910 Other psychoactive substance abuse, uncomplicated: Secondary | ICD-10-CM | POA: Diagnosis present

## 2017-07-01 LAB — I-STAT BETA HCG BLOOD, ED (MC, WL, AP ONLY)

## 2017-07-01 LAB — COMPREHENSIVE METABOLIC PANEL
ALT: 11 U/L — ABNORMAL LOW (ref 14–54)
AST: 19 U/L (ref 15–41)
Albumin: 4.2 g/dL (ref 3.5–5.0)
Alkaline Phosphatase: 55 U/L (ref 38–126)
Anion gap: 7 (ref 5–15)
BUN: 25 mg/dL — ABNORMAL HIGH (ref 6–20)
CHLORIDE: 107 mmol/L (ref 101–111)
CO2: 26 mmol/L (ref 22–32)
Calcium: 9.5 mg/dL (ref 8.9–10.3)
Creatinine, Ser: 0.88 mg/dL (ref 0.44–1.00)
Glucose, Bld: 101 mg/dL — ABNORMAL HIGH (ref 65–99)
POTASSIUM: 3.6 mmol/L (ref 3.5–5.1)
SODIUM: 140 mmol/L (ref 135–145)
Total Bilirubin: 0.6 mg/dL (ref 0.3–1.2)
Total Protein: 7.1 g/dL (ref 6.5–8.1)

## 2017-07-01 LAB — CBC
HCT: 37.3 % (ref 36.0–46.0)
Hemoglobin: 12.3 g/dL (ref 12.0–15.0)
MCH: 30.1 pg (ref 26.0–34.0)
MCHC: 33 g/dL (ref 30.0–36.0)
MCV: 91.2 fL (ref 78.0–100.0)
Platelets: 277 10*3/uL (ref 150–400)
RBC: 4.09 MIL/uL (ref 3.87–5.11)
RDW: 12.4 % (ref 11.5–15.5)
WBC: 5.9 10*3/uL (ref 4.0–10.5)

## 2017-07-01 LAB — CBG MONITORING, ED: GLUCOSE-CAPILLARY: 100 mg/dL — AB (ref 65–99)

## 2017-07-01 LAB — ETHANOL

## 2017-07-01 LAB — SALICYLATE LEVEL

## 2017-07-01 LAB — ACETAMINOPHEN LEVEL: Acetaminophen (Tylenol), Serum: <10 ug/mL — ABNORMAL LOW (ref 10–30)

## 2017-07-01 LAB — MAGNESIUM: Magnesium: 2.1 mg/dL (ref 1.7–2.4)

## 2017-07-01 NOTE — ED Triage Notes (Signed)
Patient did not tolerate an NPA

## 2017-07-01 NOTE — ED Notes (Addendum)
Poison control notified of overdose and advised for: EKG, Acetaminophen level at 4hr, Magnesium level, CMP, and observe until alert and oriented x 4 and not tachycardic also do not administer Charcoal due to mentation.

## 2017-07-01 NOTE — ED Triage Notes (Signed)
Patient arrives by EMS with complaints Seroquel overdose-took 30/200 mg Seroquel ~30 minutes ago. Patient took these in a suicide gesture. Patient called 911.

## 2017-07-01 NOTE — ED Notes (Signed)
Patient is sleepy-awakes to name-will not make eye contact-asked patient what happened tonight-patient states that "I don't want to talk about it".

## 2017-07-01 NOTE — ED Notes (Signed)
Bed: [facility] Expected date:  Expected time:  Means of arrival:  Comments: 26 yo F/ OD

## 2017-07-02 ENCOUNTER — Inpatient Hospital Stay (HOSPITAL_COMMUNITY): Payer: Self-pay

## 2017-07-02 ENCOUNTER — Emergency Department (HOSPITAL_COMMUNITY): Payer: Self-pay

## 2017-07-02 DIAGNOSIS — R400 Somnolence: Secondary | ICD-10-CM | POA: Diagnosis present

## 2017-07-02 DIAGNOSIS — T43502S Poisoning by unspecified antipsychotics and neuroleptics, intentional self-harm, sequela: Secondary | ICD-10-CM

## 2017-07-02 DIAGNOSIS — R05 Cough: Secondary | ICD-10-CM

## 2017-07-02 DIAGNOSIS — F1910 Other psychoactive substance abuse, uncomplicated: Secondary | ICD-10-CM | POA: Diagnosis present

## 2017-07-02 LAB — BLOOD GAS, VENOUS
Acid-base deficit: 0.3 mmol/L (ref 0.0–2.0)
BICARBONATE: 22.9 mmol/L (ref 20.0–28.0)
O2 Saturation: 94.1 %
PCO2 VEN: 34.4 mmHg — AB (ref 44.0–60.0)
Patient temperature: 98.6
pH, Ven: 7.438 — ABNORMAL HIGH (ref 7.250–7.430)
pO2, Ven: 72.3 mmHg — ABNORMAL HIGH (ref 32.0–45.0)

## 2017-07-02 LAB — ACETAMINOPHEN LEVEL: Acetaminophen (Tylenol), Serum: <10 ug/mL — ABNORMAL LOW (ref 10–30)

## 2017-07-02 LAB — CBC
HCT: 35.6 % — ABNORMAL LOW (ref 36.0–46.0)
HEMOGLOBIN: 11.7 g/dL — AB (ref 12.0–15.0)
MCH: 30.3 pg (ref 26.0–34.0)
MCHC: 32.9 g/dL (ref 30.0–36.0)
MCV: 92.2 fL (ref 78.0–100.0)
Platelets: 247 10*3/uL (ref 150–400)
RBC: 3.86 MIL/uL — ABNORMAL LOW (ref 3.87–5.11)
RDW: 12.8 % (ref 11.5–15.5)
WBC: 8.5 10*3/uL (ref 4.0–10.5)

## 2017-07-02 LAB — RAPID URINE DRUG SCREEN, HOSP PERFORMED
Amphetamines: NOT DETECTED
BARBITURATES: NOT DETECTED
BENZODIAZEPINES: NOT DETECTED
COCAINE: POSITIVE — AB
OPIATES: NOT DETECTED
TETRAHYDROCANNABINOL: POSITIVE — AB

## 2017-07-02 LAB — CREATININE, SERUM
CREATININE: 0.86 mg/dL (ref 0.44–1.00)
GFR calc Af Amer: >60 mL/min (ref >60)

## 2017-07-02 MED ORDER — SODIUM CHLORIDE 0.9 % IV SOLN
INTRAVENOUS | Status: DC
  Administered 2017-07-02 – 2017-07-04 (×2): via INTRAVENOUS

## 2017-07-02 MED ORDER — SODIUM CHLORIDE 0.9 % IV BOLUS (SEPSIS)
1000.0000 mL | Freq: Once | INTRAVENOUS | Status: AC
  Administered 2017-07-02: 1000 mL via INTRAVENOUS

## 2017-07-02 MED ORDER — ONDANSETRON HCL 4 MG PO TABS: 4.0000 mg | ORAL_TABLET | Freq: Four times a day (QID) | ORAL | Status: DC | PRN

## 2017-07-02 MED ORDER — ACETAMINOPHEN 650 MG RE SUPP: 650.0000 mg | Freq: Four times a day (QID) | RECTAL | Status: DC | PRN

## 2017-07-02 MED ORDER — ONDANSETRON HCL 4 MG/2ML IJ SOLN: 4.0000 mg | Freq: Four times a day (QID) | INTRAMUSCULAR | Status: DC | PRN

## 2017-07-02 MED ORDER — ACETAMINOPHEN 325 MG PO TABS
650.0000 mg | ORAL_TABLET | Freq: Four times a day (QID) | ORAL | Status: DC | PRN
  Administered 2017-07-04 – 2017-07-06 (×5): 650 mg via ORAL
  Filled 2017-07-02 (×6): qty 2

## 2017-07-02 MED ORDER — ENOXAPARIN SODIUM 40 MG/0.4ML ~~LOC~~ SOLN
40.0000 mg | SUBCUTANEOUS | Status: DC
  Administered 2017-07-02 – 2017-07-06 (×5): 40 mg via SUBCUTANEOUS
  Filled 2017-07-02 (×6): qty 0.4

## 2017-07-02 NOTE — ED Notes (Signed)
Bed: [facility] Expected date:  Expected time:  Means of arrival:  Comments: 

## 2017-07-02 NOTE — H&P (Signed)
History and Physical  Stacy NashDesiree M Demartin ZOX:[id number] DOB: 06/12/1991 DOA: 07/01/2017  Referring physician: Dr. Dalene SeltzerSchlossman, ER physician  PCP: Fleet ContrasAvbuere, Edwin, MD  Outpatient Specialists: None Patient coming from: Reportedly home  Chief Complaint: Overdose  Please note that patient with her somnolence secondary to overdose unable to give history. History obtained from emergency room physician and ER records HPI: Stacy Moss is a 26 y.o. female with medical history significant for  cocaine abuse who presented to the emergency room on the evening of 9/10 stating that she had taken 30 pills of Seroquel. She was awake and conscious although somewhat drowsy. No electrolyte abnormalities. Noted to be slightly tachycardic, otherwise no arrhythmia. Case was discussed with poison control and once acetaminophen level normal, plan was to hold patient in ER psychiatry holding until she could be assessed by psychiatry the next morning. However, patient's somnolence persisted. CT scan of head and ABG were unremarkable. It was felt that likely her Seroquel was extended release and follow-up discussion with poison control stated it might be some time (greater than 24 hours) before patient was more fully awake and alert. At this point, felt best that patient be admitted and monitored for medical clearance.  Nursing noted that patient was coughing up sputum greenish in color earlier  Review of Systems: Pt  unable to give review of systems due to persistent somnolence    Past Medical History:  Diagnosis Date  . ADD (attention deficit disorder)   . Anxiety   . Bipolar affective (HCC)   . Depression   . History of cocaine use 10/2014  . History of gestational diabetes mellitus (GDM)    with prior pregnancy  . History of marijuana use   . History of suicidal ideation   . History of VBAC   . Hx of eye surgery 02/2015  . Hx of tonsillectomy   . Hypertension    on meds 2011  . Mental disorder   .  Polysubstance abuse 10/2014   Ectasy use  . S/P cesarean section 05/25/2012  . S/p supracervical hysterectomy and bilateral salpingectomy on 09/11/15 09/11/2015  . Seizures [facility])    age [age]- after fell and hit head  . Smoker    Past Surgical History:  Procedure Laterality Date  . ABDOMINAL HYSTERECTOMY N/A 09/11/2015   Procedure: SUPRACERVICAL ABDONIMAL HYSTERECTOMY, ;  Surgeon: Tereso NewcomerUgonna A Anyanwu, MD;  Location: [facility];  Service: Gynecology;  Laterality: N/A;  . BILATERAL SALPINGECTOMY  09/11/2015   Procedure: BILATERAL SALPINGECTOMY;  Surgeon: Tereso NewcomerUgonna A Anyanwu, MD;  Location: [facility];  Service: Gynecology;;  . CESAREAN SECTION  05/25/2012   Procedure: CESAREAN SECTION;  Surgeon: Sherron MondayJody Bovard, MD;  Location: [facility];  Service: Gynecology;  Laterality: N/A;  Primary cesarean section with delivery of baby girl at 0700. Apgars9/9.  Marland Kitchen. CESAREAN SECTION N/A 08/11/2015   Procedure: CESAREAN SECTION;  Surgeon: Lazaro ArmsLuther H Eure, MD;  Location: [facility];  Service: Obstetrics;  Laterality: N/A;  . ORIF ORBITAL FRACTURE Left 02/25/2015   Procedure: OPEN REDUCTION INTERNAL FIXATION (ORIF) LEFT ORBITAL FRACTURE;  Surgeon: Melvenia BeamMitchell Gore, MD;  [facility];  Service: ENT;  Laterality: Left;  . TONSILLECTOMY      Social History:  reports that she has been smoking Cigarettes.  She has a 2.50 pack-year smoking history. She has never used smokeless tobacco. She reports that she uses drugs, including Marijuana. She reports that she does not drink alcohol.  Above is obtained from previous records. Unclear as to her ambulatory status  or home living situation    No Known Allergies  Family History  Problem Relation Age of Onset  . Cancer Maternal Grandmother   . Hypertension Maternal Grandmother   . Hypertension Mother   . Anesthesia problems Neg Hx       Prior to Admission medications   Not on File    Physical Exam: BP (!) 145/82 (BP Location: Left Arm) Comment (BP Location): left arm  Pulse (!) 109   Temp  97.7 F (36.5 C) (Axillary)   Resp 16   Ht  (1.676 m)   Wt 81.6 kg (180 lb)   LMP 10/22/2014 (Approximate)   SpO2 100%   BMI 29.05 kg/m   General:  Somnolent, recently wakes up to sternal rub, because right back to sleep  Eyes: sclerae appear nonicteric  ENT: normocephalic, atraumatic, mucous membranes are slightly dry  NecSupple, no JVD  Cardiovascular: tachycardic, regular rhythm  Respiratory: clear to auscultation bilaterally  Abdomen: soft, nontender, nondistended, positive bowel sounds  Skin: no skin breaks, tears or lesions  Musculoskeletal: no clubbing or cyanosis or edema  Psychiatric: currently somnolent, given previous history considered suicidal  Neurologic: no focal deficits although exam severely limited due to patient's somnolence           Labs on Admission:  Basic Metabolic Panel:  Recent Labs Lab 07/01/17 2123 07/01/17 2233  NA 140  --   K 3.6  --   CL 107  --   CO2 26  --   GLUCOSE 101*  --   BUN 25*  --   CREATININE 0.88  --   CALCIUM 9.5  --   MG  --  2.1   Liver Function Tests:  Recent Labs Lab 07/01/17 2123  AST 19  ALT 11*  ALKPHOS 55  BILITOT 0.6  PROT 7.1  ALBUMIN 4.2   No results for input(s): LIPASE, AMYLASE in the last 168 hours. No results for input(s): AMMONIA in the last 168 hours. CBC:  Recent Labs Lab 07/01/17 2123  WBC 5.9  HGB 12.3  HCT 37.3  MCV 91.2  PLT 277   Cardiac Enzymes: No results for input(s): CKTOTAL, CKMB, CKMBINDEX, TROPONINI in the last 168 hours.  BNP (last 3 results) No results for input(s): BNP in the last 8760 hours.  ProBNP (last 3 results) No results for input(s): PROBNP in the last 8760 hours.  CBG:  Recent Labs Lab 07/01/17 2136  GLUCAP 100*    Radiological Exams on Admission: Ct Head Wo Contrast  Result Date: 07/02/2017 CLINICAL DATA:  26 year old female status post Seroquel overdose. Altered mental status, not responding. EXAM: CT HEAD WITHOUT CONTRAST TECHNIQUE:  Contiguous axial images were obtained from the base of the skull through the vertex without intravenous contrast. COMPARISON:  Head CTs without contrast 09/01/2015, 02/24/2015. FINDINGS: Brain: Cerebral volume remains normal. No midline shift, ventriculomegaly, mass effect, evidence of mass lesion, intracranial hemorrhage or evidence of cortically based acute infarction. Gray-white matter differentiation is within normal limits throughout the brain. Vascular: No suspicious intracranial vascular hyperdensity. Skull: No osseous abnormality identified. Sinuses/Orbits: Visualized paranasal sinuses and mastoids are stable and well pneumatized. Other: Disconjugate gaze. Orbits soft tissues otherwise appear normal. Visualized scalp soft tissues are within normal limits. IMPRESSION: Stable and normal noncontrast CT appearance of the brain. Electronically Signed   By: Odessa Fleming M.D.   On: 07/02/2017 11:40    EKG: Independently reviewed. Sinus tachycardia with left atrial enlargement. Read as questionable lateral lead ischemia, not impressive  Assessment/Plan Present on Admission: . Polysubstance abuse . Somnolence  Principal Problem:   Overdose of antipsychotic, intentional self-harm, sequela (HCC): Monitor on telemetry. IV fluids. When she wakes up, will need formal psychiatry evaluation. Currently involuntarily committed. Active Problems:   Polysubstance abuse: Noted THC and cocaine urine drug screen. Patient does have a previous history of this. Currently somnolent, unable to counsel  Somnolence: Secondary to extended release Seroquel.    Productive cough with greenish sputum: We'll check chest x-ray   DVT prophylaxis: Lovenox   Code Status:presumed full code Family Communication: np family present Disposition Plan: continue inpatient until fully awake and medically stable. Then disposition as per psychiatry    Consults called: psychiatry    Admission status: Anticipate will be here for several  days requiring acute Hospital services to monitor for safety and patient at risk for harm to herself. Admit as inpatient      Hollice Espy MD [facility] Pager [phone]  If 7PM-7AM, please contact night-coverage [email or web address] Password [facility]  07/02/2017, 7:27 PM

## 2017-07-02 NOTE — ED Notes (Signed)
[facility] updated on patient condition

## 2017-07-02 NOTE — ED Provider Notes (Addendum)
[facility]-EMERGENCY DEPT Provider Note   CSN: [id number] Arrival date & time: 07/01/17  2118     History   Chief Complaint Chief Complaint  Patient presents with  . Seroquel Overdose   Triage Note 09/10 2124 Patient did not tolerate an NPA  09/10 2121 Patient arrives by EMS with complaints Seroquel overdose-took 30/200 mg Seroquel ~30 minutes ago. Patient took these in a suicide gesture. Patient called 911.    HPI Stacy Moss is a 26 y.o. female.  HPI Remainder of history, ROS, and physical exam limited due to patient's condition (AMS from sedation due to overdose). Additional information was obtained from EMS.   Level V Caveat.   Past Medical History:  Diagnosis Date  . ADD (attention deficit disorder)   . Anxiety   . Bipolar affective (HCC)   . Depression   . History of cocaine use 10/2014  . History of gestational diabetes mellitus (GDM)    with prior pregnancy  . History of marijuana use   . History of suicidal ideation   . History of VBAC   . Hx of eye surgery 02/2015  . Hx of tonsillectomy   . Hypertension    on meds 2011  . Mental disorder   . Polysubstance abuse 10/2014   Ectasy use  . S/P cesarean section 05/25/2012  . S/p supracervical hysterectomy and bilateral salpingectomy on 09/11/15 09/11/2015  . Seizures [facility])    age 33/13- after fell and hit head  . Smoker     Patient Active Problem List   Diagnosis Date Noted  . Uterine rupture during labor 09/11/2015  . S/p supracervical hysterectomy and bilateral salpingectomy on 09/11/15 09/11/2015  . Acute blood loss anemia 09/01/2015  . Delayed postpartum hemorrhage x 3 episodes 08/16/2015  . S/P emergency cesarean section for uterine rupture on 08/11/15 08/11/2015  . 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 History of suicidal ideation 03/15/2015  . Chest pain 03/14/2015  . Intermittent palpitations 03/14/2015  . Syncopal episodes 03/09/2015    Past Surgical History:    Procedure Laterality Date  . ABDOMINAL HYSTERECTOMY N/A 09/11/2015   Procedure: SUPRACERVICAL ABDONIMAL HYSTERECTOMY, ;  Surgeon: Tereso Newcomer, MD;  Location: [facility] ORS;  Service: Gynecology;  Laterality: N/A;  . BILATERAL SALPINGECTOMY  09/11/2015   Procedure: BILATERAL SALPINGECTOMY;  Surgeon: Tereso Newcomer, MD;  Location: [facility] ORS;  Service: Gynecology;;  . CESAREAN SECTION  05/25/2012   Procedure: CESAREAN SECTION;  Surgeon: Sherron Monday, MD;  Location: [facility] ORS;  Service: Gynecology;  Laterality: N/A;  Primary cesarean section with delivery of baby girl at 0700. Apgars9/9.  Marland Kitchen CESAREAN SECTION N/A 08/11/2015   Procedure: CESAREAN SECTION;  Surgeon: Lazaro Arms, MD;  Location: [facility] ORS;  Service: Obstetrics;  Laterality: N/A;  . ORIF ORBITAL FRACTURE Left 02/25/2015   Procedure: OPEN REDUCTION INTERNAL FIXATION (ORIF) LEFT ORBITAL FRACTURE;  Surgeon: Melvenia Beam, MD;  Location: [facility] OR;  Service: ENT;  Laterality: Left;  . TONSILLECTOMY      OB History    Gravida Para Term Preterm AB Living   0 2 3   SAB TAB Ectopic Multiple Live Births   2 0 0 0 3       Home Medications    Prior to Admission medications   Medication Sig Start Date End Date Taking? Authorizing Provider  HYDROcodone-acetaminophen (NORCO) 5-325 MG tablet Take 1-2 tablets by mouth every 4 (four) hours as needed for severe pain. 11/18/16  Loren Racer, MD  ibuprofen (ADVIL,MOTRIN) 200 MG tablet Take 800 mg by mouth every 6 (six) hours as needed for moderate pain.    [provider]  metroNIDAZOLE (FLAGYL) 500 MG tablet Take 1 tablet (500 mg total) by mouth 2 (two) times daily. Patient not taking: Reported on 11/17/2016 07/05/16   Everlene Farrier, PA-C  naproxen (NAPROSYN) 250 MG tablet Take 1 tablet (250 mg total) by mouth 2 (two) times daily with a meal. Patient not taking: Reported on 11/17/2016 07/05/16   Everlene Farrier, PA-C  pantoprazole (PROTONIX) 40 MG tablet Take 1 tablet (40 mg total) by mouth  daily. Patient not taking: Reported on 11/17/2016 11/24/15   Ward, Layla Maw, DO    Family History Family History  Problem Relation Age of Onset  . Cancer Maternal Grandmother   . Hypertension Maternal Grandmother   . Hypertension Mother   . Anesthesia problems Neg Hx     Social History Social History  Substance Use Topics  . Smoking status: Current Every Day Smoker    Packs/day: 0.25    Years: 10.00    Types: Cigarettes  . Smokeless tobacco: Never Used  . Alcohol use No     Comment: Former EtOH abuse     Allergies   Patient has no known allergies.   Review of Systems Review of Systems  Unable to perform ROS: Other   Remainder of history, ROS, and physical exam limited due to patient's condition (Altered mental status due to sedation from overdose).      Physical Exam Updated Vital Signs BP 113/63   Pulse 97   Temp 97.7 F (36.5 C) (Oral)   Resp (!) 22   LMP 10/22/2014 (Approximate)   SpO2 99%   Physical Exam  Constitutional: She appears well-developed and well-nourished. She appears lethargic. No distress.  HENT:  Head: Normocephalic and atraumatic.  Nose: Nose normal.  Eyes: Pupils are equal, round, and reactive to light. Conjunctivae and EOM are normal. Right eye exhibits no discharge. Left eye exhibits no discharge. No scleral icterus.  Neck: Normal range of motion. Neck supple.  Cardiovascular: Regular rhythm.  Tachycardia present.  Exam reveals no gallop and no friction rub.   No murmur heard. Pulmonary/Chest: Effort normal and breath sounds normal. No stridor. No respiratory distress. She has no rales.  Abdominal: Soft. She exhibits no distension. There is no tenderness.  Musculoskeletal: She exhibits no edema or tenderness.  Neurological: She appears lethargic. GCS eye subscore is 2. GCS verbal subscore is 5. GCS motor subscore is 5.  Arousable to painful stimuli. Oriented to person and place.  Skin: Skin is warm and dry. No rash noted. She is not  diaphoretic. No erythema.  Psychiatric: She has a normal mood and affect.  Vitals reviewed.    ED Treatments / Results  Labs (all labs ordered are listed, but only abnormal results are displayed) Labs Reviewed  COMPREHENSIVE METABOLIC PANEL - Abnormal; Notable for the following:       Result Value   Glucose, Bld 101 (*)    BUN 25 (*)    ALT 11 (*)    All other components within normal limits  ACETAMINOPHEN LEVEL - Abnormal; Notable for the following:    Acetaminophen (Tylenol), Serum <10 (*)    All other components within normal limits  CBG MONITORING, ED - Abnormal; Notable for the following:    Glucose-Capillary 100 (*)    All other components within normal limits  ETHANOL  SALICYLATE LEVEL  CBC  MAGNESIUM  RAPID URINE DRUG SCREEN, HOSP PERFORMED  I-STAT BETA HCG BLOOD, ED ([facility], [facility], [facility] ONLY)    EKG  EKG Interpretation None       Radiology No results found.  Procedures Procedures (including critical care time)  Medications Ordered in ED Medications - No data to display   Initial Impression / Assessment and Plan / ED Course  I have reviewed the triage vital signs and the nursing notes.  Pertinent labs & imaging results that were available during my care of the patient were reviewed by me and considered in my medical decision making (see chart for details).     Patient is protecting her airway and does not require intubation at this time however she will be closely monitored.   Coingestion labs obtained. Labs grossly reassuring. Four-hour acetaminophen level will be obtained at 0030.  Case is discussed with poison control who recommended observation until patient is alert and oriented 4 and her tachycardia has resolved. When she is medically cleared patient should be evaluated by behavioral health for suicide attempt.  Patient IVC'd due to suicide attempt.  Patient care turned over to Dr Oletta CohnPolina at [facility]0030. Patient case and results discussed in detail; please  see their note for further ED managment.      Final Clinical Impressions(s) / ED Diagnoses   Final diagnoses:  Intentional drug overdose, initial encounter [facility](HCC)  Suicide attempt [facility](HCC)        Cardama, Amadeo GarnetPedro Eduardo, MD 07/02/17 [phone]

## 2017-07-02 NOTE — Progress Notes (Signed)
One bag personal belongings placed in bin in locked utility room

## 2017-07-02 NOTE — ED Notes (Addendum)
Patient's belongings in 2nd cupboard by 19-22 side

## 2017-07-03 DIAGNOSIS — T43502S Poisoning by unspecified antipsychotics and neuroleptics, intentional self-harm, sequela: Secondary | ICD-10-CM

## 2017-07-03 LAB — CBC
HCT: 34 % — ABNORMAL LOW (ref 36.0–46.0)
Hemoglobin: 11.2 g/dL — ABNORMAL LOW (ref 12.0–15.0)
MCH: 30.4 pg (ref 26.0–34.0)
MCHC: 32.9 g/dL (ref 30.0–36.0)
MCV: 92.4 fL (ref 78.0–100.0)
PLATELETS: 234 10*3/uL (ref 150–400)
RBC: 3.68 MIL/uL — ABNORMAL LOW (ref 3.87–5.11)
RDW: 12.8 % (ref 11.5–15.5)
WBC: 8.7 10*3/uL (ref 4.0–10.5)

## 2017-07-03 LAB — BASIC METABOLIC PANEL
Anion gap: 7 (ref 5–15)
BUN: 17 mg/dL (ref 6–20)
CALCIUM: 8.5 mg/dL — AB (ref 8.9–10.3)
CO2: 23 mmol/L (ref 22–32)
CREATININE: 0.91 mg/dL (ref 0.44–1.00)
Chloride: 110 mmol/L (ref 101–111)
GFR calc Af Amer: >60 mL/min (ref >60)
Glucose, Bld: 67 mg/dL (ref 65–99)
Potassium: 3.5 mmol/L (ref 3.5–5.1)
SODIUM: 140 mmol/L (ref 135–145)

## 2017-07-03 LAB — HIV ANTIBODY (ROUTINE TESTING W REFLEX): HIV SCREEN 4TH GENERATION: NONREACTIVE

## 2017-07-03 MED ORDER — FAMOTIDINE 20 MG PO TABS
20.0000 mg | ORAL_TABLET | Freq: Every day | ORAL | Status: DC
  Administered 2017-07-03 – 2017-07-08 (×6): 20 mg via ORAL
  Filled 2017-07-03 (×6): qty 1

## 2017-07-03 NOTE — Care Management Note (Signed)
Case Management Note  Patient Details  Name: Stacy Moss MRN: [id number] Date of Birth: 09/23/1991  Subjective/Objective:      overdose              Action/Plan: Date:  July 03, 2017 Chart reviewed for concurrent status and case management needs. Will continue to follow patient progress. Discharge Planning: following for needs Expected discharge date: [id number] Stacy SmilingRhonda Moss, BSN, [place]3, [place]   [phone]  Expected Discharge Date:                  Expected Discharge Plan:  Home/Self Care  In-House Referral:     Discharge planning Services  CM Consult  Post Acute Care Choice:    Choice offered to:     DME Arranged:    DME Agency:     HH Arranged:    HH Agency:     Status of Service:  In process, will continue to follow  If discussed at Long Length of Stay Meetings, dates discussed:    Additional Comments:  Golda AcreDavis, Stacy Lynn, RN 07/03/2017, 8:26 AM

## 2017-07-03 NOTE — Progress Notes (Signed)
[facility] Progress Note  Patient: Stacy Moss ZOX:[id number]   PCP: Fleet Contras, MD DOB: [birth year]   DOA: 07/01/2017   DOS: 07/03/2017   Date of Service: the patient was seen and examined on 07/03/2017  Subjective: patient more awake this morning. Denies any acute complaint. Has dry mouth and feels thirsty. Agitated  Brief hospital course: Pt. with PMH of cocaine abuse; admitted on 07/01/2017, presented with complaint of rowsiness due to overdose, was found to have suicidal ideation with drug overdose. Currently further plan is ollow-up on psychiatry recommendation.  Assessment and Plan: 1. Acute encephalopathy. Drug overdose. Likely long-acting Seroquel as well as combination of cannabis and cocaine. Moderate right now. We will advance her diet. Poison control continues to follow-up on the patient No acute intervention at present other than IV fluids. Monitor on telemetry. No acute events 05.  2. Suicidal ideation. Psychiatry consulted Follow-up follow-up on recommendation. Currently IVC.  3. Polysubstance abuse. Monitor for withdrawal. Avoid beta blockers due to positive cocaine.  Diet: regular diet DVT Prophylaxis: subcutaneous Heparin  Advance goals of care discussion: full code  Family Communication: no family was present at bedside, at the time of interview.  Disposition:  Discharge to home.  Consultants: psych  Procedures: none  Antibiotics: Anti-infectives    None       Objective: Physical Exam: Vitals:   07/02/17 1621 07/02/17 1700 07/02/17 2209 07/03/17 1443  BP: (!) 153/95 (!) 145/82 (!) 158/84 (!) 147/80  Pulse: (!) 114 (!) 109 (!) 118 (!) 103  Resp: Temp: 97.7 F (36.5 C)  99.8 F (37.7 C) 99.7 F (37.6 C)  TempSrc: Axillary  Axillary Oral  SpO2: 100%  99% 99%  Weight:      Height:        Intake/Output Summary (Last 24 hours) at 07/03/17 1920 Last data filed at 07/03/17 1700  Gross per 24 hour  Intake           2683.33 ml  Output                0 ml  Net          2683.33 ml   Filed Weights   07/02/17 0112  Weight: 81.6 kg (180 lb)   General: Alert, Awake and Oriented to Time, Place and Person. Appear in mild distress, affect appropriate Eyes: PERRL, Conjunctiva normal ENT: Oral Mucosa clear moist. Neck: no JVD, no Abnormal Mass Or lumps Cardiovascular: S1 and S2 Present, no Murmur, Peripheral Pulses Present Respiratory: normal respiratory effort, Bilateral Air entry equal and Decreased, no use of accessory muscle, Clear to Auscultation, no Crackles, no wheezes Abdomen: Bowel Sound present, Soft and no tenderness, no hernia Skin: no redness, no Rash, no induration Extremities: no Pedal edema, no calf tenderness Neurologic: Grossly no focal neuro deficit. Bilaterally Equal motor strength  Data Reviewed: CBC:  Recent Labs Lab 07/01/17 2123 07/02/17 1943 07/03/17 0717  WBC 5.9 8.5 8.7  HGB 12.3 11.7* 11.2*  HCT 37.3 35.6* 34.0*  MCV 91.2 92.2 92.4  PLT 277 247 234   Basic Metabolic Panel:  Recent Labs Lab 07/01/17 2123 07/01/17 2233 07/02/17 1943 07/03/17 0717  NA 140  --   --  140  K 3.6  --   --  3.5  CL 107  --   --  110  CO2 26  --   --  23  GLUCOSE 101*  --   --  67  BUN 25*  --   --  17  CREATININE 0.88  --  0.86 0.91  CALCIUM 9.5  --   --  8.5*  MG  --  2.1  --   --     Liver Function Tests:  Recent Labs Lab 07/01/17 2123  AST 19  ALT 11*  ALKPHOS 55  BILITOT 0.6  PROT 7.1  ALBUMIN 4.2   No results for input(s): LIPASE, AMYLASE in the last 168 hours. No results for input(s): AMMONIA in the last 168 hours. Coagulation Profile: No results for input(s): INR, PROTIME in the last 168 hours. Cardiac Enzymes: No results for input(s): CKTOTAL, CKMB, CKMBINDEX, TROPONINI in the last 168 hours. BNP (last 3 results) No results for input(s): PROBNP in the last 8760 hours. CBG:  Recent Labs Lab 07/01/17 2136  GLUCAP 100*   Studies: Dg Chest Port 1  View  Result Date: 07/02/2017 CLINICAL DATA:  Productive cough today. EXAM: PORTABLE CHEST 1 VIEW COMPARISON:  CT chest 08/28/2015. FINDINGS: Lungs are clear. Heart size is normal. No pneumothorax or pleural fluid. No bony abnormality. IMPRESSION: Normal chest. Electronically Signed   By: Drusilla Kannerhomas  Dalessio M.D.   On: 07/02/2017 23:02    Scheduled Meds: . enoxaparin (LOVENOX) injection  40 mg Subcutaneous Q24H  . famotidine  20 mg Oral Daily   Continuous Infusions: . sodium chloride 100 mL/hr at 07/02/17 2106   PRN Meds: acetaminophen **OR** acetaminophen, ondansetron **OR** ondansetron (ZOFRAN) IV  Time spent: 35 minutes  Author: Lynden OxfordPranav Montavis Schubring, MD [facility] Pager: [phone] 07/03/2017 7:20 PM  If 7PM-7AM, please contact night-coverage at [email or web address], password [email or web address]

## 2017-07-03 NOTE — Progress Notes (Signed)
Pt called me to the room to look at the phlegm she is coughing up- moderate amt of thick green/ grey sputum. Sitter reports a harsh congested cough. Lungs are clear with no rhonchi noted. Encouraged deep breathing exercises every 1-2 hours and will monitor cough & sputum.

## 2017-07-04 LAB — EXPECTORATED SPUTUM ASSESSMENT W REFEX TO RESP CULTURE

## 2017-07-04 LAB — MAGNESIUM: MAGNESIUM: 2 mg/dL (ref 1.7–2.4)

## 2017-07-04 LAB — BASIC METABOLIC PANEL
Anion gap: 3 — ABNORMAL LOW (ref 5–15)
BUN: 18 mg/dL (ref 6–20)
CHLORIDE: 110 mmol/L (ref 101–111)
CO2: 27 mmol/L (ref 22–32)
Calcium: 8.6 mg/dL — ABNORMAL LOW (ref 8.9–10.3)
Creatinine, Ser: 0.83 mg/dL (ref 0.44–1.00)
GFR calc Af Amer: >60 mL/min (ref >60)
GFR calc non Af Amer: >60 mL/min (ref >60)
GLUCOSE: 98 mg/dL (ref 65–99)
POTASSIUM: 4.1 mmol/L (ref 3.5–5.1)
SODIUM: 140 mmol/L (ref 135–145)

## 2017-07-04 LAB — CBC
HCT: 31.4 % — ABNORMAL LOW (ref 36.0–46.0)
HEMOGLOBIN: 10.3 g/dL — AB (ref 12.0–15.0)
MCH: 30.5 pg (ref 26.0–34.0)
MCHC: 32.8 g/dL (ref 30.0–36.0)
MCV: 92.9 fL (ref 78.0–100.0)
Platelets: 229 10*3/uL (ref 150–400)
RBC: 3.38 MIL/uL — AB (ref 3.87–5.11)
RDW: 12.7 % (ref 11.5–15.5)
WBC: 8.6 10*3/uL (ref 4.0–10.5)

## 2017-07-04 LAB — EXPECTORATED SPUTUM ASSESSMENT W GRAM STAIN, RFLX TO RESP C

## 2017-07-04 MED ORDER — NICOTINE 14 MG/24HR TD PT24
14.0000 mg | MEDICATED_PATCH | TRANSDERMAL | Status: DC
  Administered 2017-07-04 – 2017-07-08 (×6): 14 mg via TRANSDERMAL
  Filled 2017-07-04 (×6): qty 1

## 2017-07-04 MED ORDER — GUAIFENESIN ER 600 MG PO TB12
600.0000 mg | ORAL_TABLET | Freq: Two times a day (BID) | ORAL | Status: DC
  Administered 2017-07-04 – 2017-07-08 (×9): 600 mg via ORAL
  Filled 2017-07-04 (×9): qty 1

## 2017-07-04 NOTE — Progress Notes (Signed)
[facility] Progress Note  Patient: Stacy Moss AOZ:[id number]   PCP: Fleet Contras, MD DOB: 18-Nov-1990   DOA: 07/01/2017   DOS: 07/04/2017   Date of Service: the patient was seen and examined on 07/04/2017  Subjective: No acute complaints, feeling better. No acute events overnight. Medically stable.  Brief hospital course: Pt. with PMH of cocaine abuse; admitted on 07/01/2017, presented with complaint of rowsiness due to overdose, was found to have suicidal ideation with drug overdose. Currently further plan is ollow-up on psychiatry recommendation.  Assessment and Plan: 1. Acute encephalopathy. Drug overdose. Likely long-acting Seroquel as well as combination of cannabis and cocaine. Moderate right now. We will advance her diet. Poison control continues to follow-up on the patient No acute intervention at present other than IV fluids. Monitor on telemetry. No acute events , EKG shows mildly prolonged QT. No acute workup for now.  2. Suicidal ideation. Psychiatry consulted Follow-up follow-up on recommendation. Currently IVC. Medically stable to be transferred to [facility]. Awaiting psychiatric consultation.  3. Polysubstance abuse. Monitor for withdrawal. Avoid beta blockers due to positive cocaine.  Diet: regular diet DVT Prophylaxis: subcutaneous Heparin  Advance goals of care discussion: full code  Family Communication: no family was present at bedside, at the time of interview.  Disposition:  Discharge to home.  Consultants: psych  Procedures: none  Antibiotics: Anti-infectives    None       Objective: Physical Exam: Vitals:   07/03/17 1443 07/03/17 2004 07/04/17 0050 07/04/17 0552  BP: (!) 147/80 (!) 145/76  128/83  Pulse: (!) 103 (!) 108  88  Resp: 18   18  Temp: 99.7 F (37.6 C) 99 F (37.2 C) 99.4 F (37.4 C) 98.7 F (37.1 C)  TempSrc: Oral Oral  Oral  SpO2: 99% 100%  99%  Weight:      Height:        Intake/Output Summary (Last 24 hours)  at 07/04/17 1912 Last data filed at 07/04/17 1638  Gross per 24 hour  Intake              360 ml  Output                0 ml  Net              360 ml   Filed Weights   07/02/17 0112  Weight: 81.6 kg (180 lb)   General: Alert, Awake and Oriented to Time, Place and Person. Appear in mild distress, affect appropriate Eyes: PERRL, Conjunctiva normal ENT: Oral Mucosa clear moist. Neck: no JVD, no Abnormal Mass Or lumps Cardiovascular: S1 and S2 Present, no Murmur, Peripheral Pulses Present Respiratory: normal respiratory effort, Bilateral Air entry equal and Decreased, no use of accessory muscle, Clear to Auscultation, no Crackles, no wheezes Abdomen: Bowel Sound present, Soft and no tenderness, no hernia Skin: no redness, no Rash, no induration Extremities: no Pedal edema, no calf tenderness Neurologic: Grossly no focal neuro deficit. Bilaterally Equal motor strength  Data Reviewed: CBC:  Recent Labs Lab 07/01/17 2123 07/02/17 1943 07/03/17 0717 07/04/17 0618  WBC 5.9 8.5 8.7 8.6  HGB 12.3 11.7* 11.2* 10.3*  HCT 37.3 35.6* 34.0* 31.4*  MCV 91.2 92.2 92.4 92.9  PLT 277 247 234 229   Basic Metabolic Panel:  Recent Labs Lab 07/01/17 2123 07/01/17 2233 07/02/17 1943 07/03/17 0717 07/04/17 0618  NA 140  --   --  140 140  K 3.6  --   --  3.5 4.1  CL 107  --   --  110 110  CO2 26  --   --  23 27  GLUCOSE 101*  --   --  67 98  BUN 25*  --   --  17 18  CREATININE 0.88  --  0.86 0.91 0.83  CALCIUM 9.5  --   --  8.5* 8.6*  MG  --  2.1  --   --  2.0    Liver Function Tests:  Recent Labs Lab 07/01/17 2123  AST 19  ALT 11*  ALKPHOS 55  BILITOT 0.6  PROT 7.1  ALBUMIN 4.2   No results for input(s): LIPASE, AMYLASE in the last 168 hours. No results for input(s): AMMONIA in the last 168 hours. Coagulation Profile: No results for input(s): INR, PROTIME in the last 168 hours. Cardiac Enzymes: No results for input(s): CKTOTAL, CKMB, CKMBINDEX, TROPONINI in the last  168 hours. BNP (last 3 results) No results for input(s): PROBNP in the last 8760 hours. CBG:  Recent Labs Lab 07/01/17 2136  GLUCAP 100*   Studies: No results found.  Scheduled Meds: . enoxaparin (LOVENOX) injection  40 mg Subcutaneous Q24H  . famotidine  20 mg Oral Daily  . guaiFENesin  600 mg Oral BID  . nicotine  14 mg Transdermal Q24H   Continuous Infusions:  PRN Meds: acetaminophen **OR** acetaminophen, ondansetron **OR** ondansetron (ZOFRAN) IV  Time spent: 35 minutes  Author: Lynden OxfordPranav Kyndal Heringer, MD [facility] Pager: [phone] 07/04/2017 7:12 PM  If 7PM-7AM, please contact night-coverage at [email or web address], password [email or web address]

## 2017-07-05 DIAGNOSIS — T1491XA Suicide attempt, initial encounter: Secondary | ICD-10-CM

## 2017-07-05 DIAGNOSIS — F1290 Cannabis use, unspecified, uncomplicated: Secondary | ICD-10-CM

## 2017-07-05 DIAGNOSIS — F17210 Nicotine dependence, cigarettes, uncomplicated: Secondary | ICD-10-CM

## 2017-07-05 DIAGNOSIS — T43592A Poisoning by other antipsychotics and neuroleptics, intentional self-harm, initial encounter: Principal | ICD-10-CM

## 2017-07-05 DIAGNOSIS — F332 Major depressive disorder, recurrent severe without psychotic features: Secondary | ICD-10-CM

## 2017-07-05 LAB — BASIC METABOLIC PANEL
ANION GAP: 7 (ref 5–15)
BUN: 16 mg/dL (ref 6–20)
CALCIUM: 8.8 mg/dL — AB (ref 8.9–10.3)
CO2: 26 mmol/L (ref 22–32)
Chloride: 107 mmol/L (ref 101–111)
Creatinine, Ser: 0.69 mg/dL (ref 0.44–1.00)
GFR calc Af Amer: >60 mL/min (ref >60)
GLUCOSE: 91 mg/dL (ref 65–99)
Potassium: 3.8 mmol/L (ref 3.5–5.1)
Sodium: 140 mmol/L (ref 135–145)

## 2017-07-05 LAB — CBC
HCT: 31.7 % — ABNORMAL LOW (ref 36.0–46.0)
Hemoglobin: 10.5 g/dL — ABNORMAL LOW (ref 12.0–15.0)
MCH: 30.8 pg (ref 26.0–34.0)
MCHC: 33.1 g/dL (ref 30.0–36.0)
MCV: 93 fL (ref 78.0–100.0)
PLATELETS: 238 10*3/uL (ref 150–400)
RBC: 3.41 MIL/uL — ABNORMAL LOW (ref 3.87–5.11)
RDW: 12.4 % (ref 11.5–15.5)
WBC: 6.7 10*3/uL (ref 4.0–10.5)

## 2017-07-05 MED ORDER — POLYETHYLENE GLYCOL 3350 17 G PO PACK
17.0000 g | PACK | Freq: Every day | ORAL | Status: DC
  Administered 2017-07-05 – 2017-07-08 (×3): 17 g via ORAL
  Filled 2017-07-05 (×4): qty 1

## 2017-07-05 MED ORDER — LIP MEDEX EX OINT
TOPICAL_OINTMENT | CUTANEOUS | Status: DC | PRN
  Filled 2017-07-05: qty 7
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
## 2017-07-05 NOTE — Progress Notes (Signed)
[facility] Progress Note  Patient: Stacy Moss:[id number]   PCP: Fleet Contras, MD DOB: Feb 17, 1991   DOA: 07/01/2017   DOS: 07/05/2017   Date of Service: the patient was seen and examined on 07/05/2017  Subjective: No acuteComplaint No acute events. Medically stable.   Brief hospital course: Pt. with PMH of cocaine abuse; admitted on 07/01/2017, presented with complaint of rowsiness due to overdose, was found to have suicidal ideation with drug overdose. Currently further plan is to arrange inpatient psychiatric admission  Assessment and Plan: 1. Acute encephalopathy. Drug overdose. Likely long-acting Seroquel as well as combination of cannabis and cocaine. Moderate right now. We will advance her diet. Poison control continues to follow-up on the patient No acute intervention at present other than IV fluids. Monitor on telemetry. No acute events , EKG shows mildly prolonged QT. No acute workup for now.  2. Suicidal ideation. Psychiatry consulted, appreciate input Follow-up follow-up on recommendation. Currently IVC. Medically stable to be [facility] to [facility]. Awaiting arrangement.  3. Polysubstance abuse. Monitor for withdrawal. Avoid beta blockers due to positive cocaine.  Diet: regular diet DVT Prophylaxis: subcutaneous Heparin  Advance goals of care discussion: full code  Family Communication: no family was present at bedside, at the time of interview.  Disposition:  Discharge to home.  Consultants: psych  Procedures: none  Antibiotics: Anti-infectives    None       Objective: Physical Exam: Vitals:   07/04/17 0552 07/04/17 2033 07/05/17 0500 07/05/17 1500  BP: 128/83 (!) 152/88 (!) 152/98 (!) 149/94  Pulse: 88 95 90 92  Resp: Temp: 98.7 F (37.1 C) 98.1 F (36.7 C) 98.3 F (36.8 C) 98.2 F (36.8 C)  TempSrc: Oral  Oral Oral  SpO2: 99% 100% 100% 100%  Weight:      Height:        Intake/Output Summary (Last 24 hours) at  07/05/17 1716 Last data filed at 07/05/17 1200  Gross per 24 hour  Intake              840 ml  Output                0 ml  Net              840 ml   Filed Weights   07/02/17 0112  Weight: 81.6 kg (180 lb)   General: Alert, Awake and Oriented to Time, Place and Person. Appear in mild distress, affect appropriate Eyes: PERRL, Conjunctiva normal ENT: Oral Mucosa clear moist. Neck: no JVD, no Abnormal Mass Or lumps Cardiovascular: S1 and S2 Present, no Murmur, Peripheral Pulses Present Respiratory: normal respiratory effort, Bilateral Air entry equal and Decreased, no use of accessory muscle, Clear to Auscultation, no Crackles, no wheezes Abdomen: Bowel Sound present, Soft and no tenderness, no hernia Skin: no redness, no Rash, no induration Extremities: no Pedal edema, no calf tenderness Neurologic: Grossly no focal neuro deficit. Bilaterally Equal motor strength  Data Reviewed: CBC:  Recent Labs Lab 07/01/17 2123 07/02/17 1943 07/03/17 0717 07/04/17 0618 07/05/17 0548  WBC 5.9 8.5 8.7 8.6 6.7  HGB 12.3 11.7* 11.2* 10.3* 10.5*  HCT 37.3 35.6* 34.0* 31.4* 31.7*  MCV 91.2 92.2 92.4 92.9 93.0  PLT 277 247 234 229 238   Basic Metabolic Panel:  Recent Labs Lab 07/01/17 2123 07/01/17 2233 07/02/17 1943 07/03/17 0717 07/04/17 0618 07/05/17 0548  NA 140  --   --  140 140 140  K 3.6  --   --  3.5 4.1 3.8  CL 107  --   --  110 110 107  CO2 26  --   --  GLUCOSE 101*  --   --  67 98 91  BUN 25*  --   --  CREATININE 0.88  --  0.86 0.91 0.83 0.69  CALCIUM 9.5  --   --  8.5* 8.6* 8.8*  MG  --  2.1  --   --  2.0  --     Liver Function Tests:  Recent Labs Lab 07/01/17 2123  AST 19  ALT 11*  ALKPHOS 55  BILITOT 0.6  PROT 7.1  ALBUMIN 4.2   No results for input(s): LIPASE, AMYLASE in the last 168 hours. No results for input(s): AMMONIA in the last 168 hours. Coagulation Profile: No results for input(s): INR, PROTIME in the last 168  hours. Cardiac Enzymes: No results for input(s): CKTOTAL, CKMB, CKMBINDEX, TROPONINI in the last 168 hours. BNP (last 3 results) No results for input(s): PROBNP in the last 8760 hours. CBG:  Recent Labs Lab 07/01/17 2136  GLUCAP 100*   Studies: No results found.  Scheduled Meds: . enoxaparin (LOVENOX) injection  40 mg Subcutaneous Q24H  . famotidine  20 mg Oral Daily  . guaiFENesin  600 mg Oral BID  . nicotine  14 mg Transdermal Q24H  . polyethylene glycol  17 g Oral Daily   Continuous Infusions:  PRN Meds: acetaminophen **OR** acetaminophen, ondansetron **OR** ondansetron (ZOFRAN) IV  Time spent: 35 minutes  Author: Lynden Oxford, MD [facility] Pager: [phone] 07/05/2017 5:16 PM  If 7PM-7AM, please contact night-coverage at [email or web address], password [facility]

## 2017-07-06 LAB — CBC
HEMATOCRIT: 34.1 % — AB (ref 36.0–46.0)
HEMOGLOBIN: 11.3 g/dL — AB (ref 12.0–15.0)
MCH: 30.4 pg (ref 26.0–34.0)
MCHC: 33.1 g/dL (ref 30.0–36.0)
MCV: 91.7 fL (ref 78.0–100.0)
Platelets: 261 10*3/uL (ref 150–400)
RBC: 3.72 MIL/uL — ABNORMAL LOW (ref 3.87–5.11)
RDW: 12.1 % (ref 11.5–15.5)
WBC: 5.6 10*3/uL (ref 4.0–10.5)

## 2017-07-06 LAB — BASIC METABOLIC PANEL
ANION GAP: 5 (ref 5–15)
BUN: 15 mg/dL (ref 6–20)
CALCIUM: 9.1 mg/dL (ref 8.9–10.3)
CO2: 28 mmol/L (ref 22–32)
Chloride: 105 mmol/L (ref 101–111)
Creatinine, Ser: 0.65 mg/dL (ref 0.44–1.00)
GFR calc non Af Amer: >60 mL/min (ref >60)
Glucose, Bld: 95 mg/dL (ref 65–99)
Potassium: 3.9 mmol/L (ref 3.5–5.1)
SODIUM: 138 mmol/L (ref 135–145)

## 2017-07-06 LAB — CULTURE, RESPIRATORY W GRAM STAIN: Culture: NORMAL

## 2017-07-06 LAB — CULTURE, RESPIRATORY

## 2017-07-06 MED ORDER — GUAIFENESIN ER 600 MG PO TB12
600.0000 mg | ORAL_TABLET | Freq: Two times a day (BID) | ORAL | 0 refills | Status: DC
Start: 2017-07-06 — End: 2017-07-12

## 2017-07-06 MED ORDER — NICOTINE 14 MG/24HR TD PT24: 14.0000 mg | MEDICATED_PATCH | TRANSDERMAL | 0 refills | Status: DC

## 2017-07-06 MED ORDER — POLYETHYLENE GLYCOL 3350 17 G PO PACK: 17.0000 g | PACK | Freq: Every day | ORAL | 0 refills | Status: DC | PRN

## 2017-07-06 NOTE — Plan of Care (Signed)
Currently no beds available at psychiatric facility. Social worker currently monitoring. Patient will await for back placement at inpatient psychiatric facility. Cannot leave AMA.

## 2017-07-06 NOTE — Discharge Summary (Signed)
[facility] Discharge Summary   Patient: Stacy Moss ZOX:[id number]   PCP: Fleet Contras, MD DOB: [birth year]   Date of admission: 07/01/2017   Date of discharge:  07/06/2017    Discharge Diagnoses:  Principal Problem:   Overdose of antipsychotic, intentional self-harm, sequela (HCC) Active Problems:   Polysubstance abuse   Somnolence   Admitted From: Home Disposition:  Behavioral Health inpatient psychiatric admission  Recommendations for Outpatient Follow-up:  1. Follow-up with PCP in one week after discharge from [facility]   Follow-up Information    Fleet Contras, MD. Schedule an appointment as soon as possible for a visit in 1 month(s).   Specialty:  Internal Medicine Contact information: [address] [place] [address] [phone]          Diet recommendation: Regular diet  Activity: The patient is advised to gradually reintroduce usual activities.  Discharge Condition: good  Code Status: Full code  History of present illness: As per the H and P dictated on admission, "Stacy Moss is a 26 y.o. female with medical history significant for  cocaine abuse who presented to the emergency room on the evening of 9/10 stating that she had taken 30 pills of Seroquel. She was awake and conscious although somewhat drowsy. No electrolyte abnormalities. Noted to be slightly tachycardic, otherwise no arrhythmia. Case was discussed with poison control and once acetaminophen level normal, plan was to hold patient in ER psychiatry holding until she could be assessed by psychiatry the next morning. However, patient's somnolence persisted. CT scan of head and ABG were unremarkable. It was felt that likely her Seroquel was extended release and follow-up discussion with poison control stated it might be some time (greater than 24 hours) before patient was more fully awake and alert. At this point, felt best that patient be admitted and monitored for medical  clearance."  Hospital Course:  Summary of her active problems in the hospital is as following. 1. Acute encephalopathy. Drug overdose. Likely long-acting Seroquel as well as combination of cannabis and cocaine. Moderate right now. We will advance her diet. Poison control continues to follow-up on the patient No acute intervention at present other than IV fluids. Monitor on telemetry. No acute events , EKG shows mildly prolonged QT. No acute workup for now.  2. Suicidal ideation. Psychiatry consulted, appreciate input Follow-up follow-up on recommendation. Currently IVC. Medically stable to be transferred to [facility]. Awaiting arrangement.  3. Polysubstance abuse. Monitor for withdrawal. Avoid beta blockers due to positive cocaine.   All other chronic medical condition were stable during the hospitalization.  Patient was ambulatory without any assistance. On the day of the discharge the patient's vitals were stable, and no other acute medical condition were reported by patient. the patient was felt safe to be discharge at [facility] with psychiatry.  Procedures and Results:  none   Consultations:  psych  DISCHARGE MEDICATION: Current Discharge Medication List    START taking these medications   Details  guaiFENesin (MUCINEX) 600 MG 12 hr tablet Take 1 tablet (600 mg total) by mouth 2 (two) times daily. Qty: 30 tablet, Refills: 0    nicotine (NICODERM CQ - DOSED IN MG/24 HOURS) 14 mg/24hr patch Place 1 patch (14 mg total) onto the skin daily. Qty: 28 patch, Refills: 0    polyethylene glycol (MIRALAX / GLYCOLAX) packet Take 17 g by mouth daily as needed. Qty: 14 each, Refills: 0       No Known Allergies Discharge Instructions    Diet general  Complete by:  As directed    Increase activity slowly    Complete by:  As directed      Discharge Exam: Filed Weights   07/02/17 0112  Weight: 81.6 kg (180 lb)   Vitals:   07/06/17 0554 07/06/17 1532  BP: (!) 157/100 (!)  152/98  Pulse: 83 91  Resp: 17 17  Temp: 98.5 F (36.9 C) 98.3 F (36.8 C)  SpO2: 100% 100%   General: Appear in no distress, no Rash; Oral Mucosa moist. Cardiovascular: S1 and S2 Present, n Murmur, no JVD Respiratory: Bilateral Air entry present and Clear to Auscultation, no Crackles, no wheezes Abdomen: Bowel Sound present, Soft and no tenderness Extremities: no Pedal edema, no calf tenderness Neurology: Grossly no focal neuro deficit.  The results of significant diagnostics from this hospitalization (including imaging, microbiology, ancillary and laboratory) are listed below for reference.    Significant Diagnostic Studies: Ct Head Wo Contrast  Result Date: 07/02/2017 CLINICAL DATA:  26 year old female status post Seroquel overdose. Altered mental status, not responding. EXAM: CT HEAD WITHOUT CONTRAST TECHNIQUE: Contiguous axial images were obtained from the base of the skull through the vertex without intravenous contrast. COMPARISON:  Head CTs without contrast 09/01/2015, 02/24/2015. FINDINGS: Brain: Cerebral volume remains normal. No midline shift, ventriculomegaly, mass effect, evidence of mass lesion, intracranial hemorrhage or evidence of cortically based acute infarction. Gray-white matter differentiation is within normal limits throughout the brain. Vascular: No suspicious intracranial vascular hyperdensity. Skull: No osseous abnormality identified. Sinuses/Orbits: Visualized paranasal sinuses and mastoids are stable and well pneumatized. Other: Disconjugate gaze. Orbits soft tissues otherwise appear normal. Visualized scalp soft tissues are within normal limits. IMPRESSION: Stable and normal noncontrast CT appearance of the brain. Electronically Signed   By: Odessa Fleming M.D.   On: 07/02/2017 11:40   Dg Chest Port 1 View  Result Date: 07/02/2017 CLINICAL DATA:  Productive cough today. EXAM: PORTABLE CHEST 1 VIEW COMPARISON:  CT chest 08/28/2015. FINDINGS: Lungs are clear. Heart size is  normal. No pneumothorax or pleural fluid. No bony abnormality. IMPRESSION: Normal chest. Electronically Signed   By: Drusilla Kanner M.D.   On: 07/02/2017 23:02    Microbiology: Recent Results (from the past 240 hour(s))  Culture, expectorated sputum-assessment     Status: None   Collection Time: 07/03/17  8:10 PM  Result Value Ref Range Status   Specimen Description SPUTUM  Final   Special Requests NONE  Final   Sputum evaluation THIS SPECIMEN IS ACCEPTABLE FOR SPUTUM CULTURE  Final   Report Status 07/04/2017 FINAL  Final  Culture, respiratory (NON-Expectorated)     Status: None   Collection Time: 07/03/17  8:10 PM  Result Value Ref Range Status   Specimen Description SPUTUM  Final   Special Requests NONE Reflexed from [id number]  Final   Gram Stain   Final    MODERATE WBC PRESENT, PREDOMINANTLY PMN RARE SQUAMOUS EPITHELIAL CELLS PRESENT MODERATE GRAM POSITIVE COCCI RARE GRAM POSITIVE RODS    Culture   Final    Consistent with normal respiratory flora. Performed at [facility], [address] [place], [place] [address]    Report Status 07/06/2017 FINAL  Final     Labs: CBC:  Recent Labs Lab 07/02/17 1943 07/03/17 0717 07/04/17 0618 07/05/17 0548 07/06/17 0649  WBC 8.5 8.7 8.6 6.7 5.6  HGB 11.7* 11.2* 10.3* 10.5* 11.3*  HCT 35.6* 34.0* 31.4* 31.7* 34.1*  MCV 92.2 92.4 92.9 93.0 91.7  PLT 247 234 229 238 261  Basic Metabolic Panel:  Recent Labs Lab 07/01/17 2123 07/01/17 2233 07/02/17 1943 07/03/17 0717 07/04/17 0618 07/05/17 0548 07/06/17 0649  NA 140  --   --  140 140 140 138  K 3.6  --   --  3.5 4.1 3.8 3.9  CL 107  --   --  110 110 107 105  CO2 26  --   --  GLUCOSE 101*  --   --  67 98 91 95  BUN 25*  --   --  CREATININE 0.88  --  0.86 0.91 0.83 0.69 0.65  CALCIUM 9.5  --   --  8.5* 8.6* 8.8* 9.1  MG  --  2.1  --   --  2.0  --   --    Liver Function Tests:  Recent Labs Lab 07/01/17 2123  AST 19  ALT 11*   ALKPHOS 55  BILITOT 0.6  PROT 7.1  ALBUMIN 4.2   No results for input(s): LIPASE, AMYLASE in the last 168 hours. No results for input(s): AMMONIA in the last 168 hours. Cardiac Enzymes: No results for input(s): CKTOTAL, CKMB, CKMBINDEX, TROPONINI in the last 168 hours. BNP (last 3 results) No results for input(s): BNP in the last 8760 hours. CBG:  Recent Labs Lab 07/01/17 2136  GLUCAP 100*   Time spent: 35 minutes  Signed:  Jaxston Chohan  [facility]  07/06/2017  , 4:39 PM

## 2017-07-07 NOTE — Progress Notes (Signed)
CSW following to assist with transfer to inpatient psychiatric treatment hospital as recommended by psychiatry consult. Was informed [facility] and [facility] at capacity today.  Referred pt to other facilities for inpatient treatment (limited due to weather considerations per various admissions staff, and also due to with no insurance). Referrals made to [facility], [facility], [facility], [facility].   Leevi Cullars, MSW, LCSW Clinical Social Work 07/07/2017 Weekend coverage [phone] 

## 2017-07-07 NOTE — Progress Notes (Signed)
[facility] PROGRESS NOTE  Patient: Stacy Moss ZOX:[id number]   PCP: Fleet Contras, MD DOB: [birth year] Moss: 07/01/2017   DOS: 07/07/2017    Subjective: No acute complaints, no acute events.  Objective:  Vitals:   07/07/17 0619 07/07/17 1519  BP: (!) 141/99 (!) 146/77  Pulse: 78 84  Resp: 16 18  Temp: 98.3 F (36.8 C) 98.1 F (36.7 C)  SpO2: 100% 100%    Clear to auscultation  Assessment and plan: Medically stable to discharge to [facility]. Awaiting bed placement.  Author: Lynden Oxford, MD [facility] Pager: [phone] 07/07/2017 3:27 PM   If 7PM-7AM, please contact night-coverage at [email or web address], password [facility]
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
## 2017-07-08 ENCOUNTER — Inpatient Hospital Stay (HOSPITAL_COMMUNITY)
Admission: AD | Admit: 2017-07-08 | Discharge: 2017-07-12 | DRG: 885 | Disposition: A | Discharge status: Home / Self Care | Payer: Federal, State, Local not specified - Other | Attending: Psychiatry | Admitting: Psychiatry

## 2017-07-08 DIAGNOSIS — Z23 Encounter for immunization: Secondary | ICD-10-CM | POA: Diagnosis not present

## 2017-07-08 DIAGNOSIS — F988 Other specified behavioral and emotional disorders with onset usually occurring in childhood and adolescence: Secondary | ICD-10-CM | POA: Diagnosis present

## 2017-07-08 DIAGNOSIS — F1410 Cocaine abuse, uncomplicated: Secondary | ICD-10-CM | POA: Diagnosis present

## 2017-07-08 DIAGNOSIS — R9431 Abnormal electrocardiogram [ECG] [EKG]: Secondary | ICD-10-CM | POA: Diagnosis present

## 2017-07-08 DIAGNOSIS — R000 Tachycardia, unspecified: Secondary | ICD-10-CM | POA: Diagnosis present

## 2017-07-08 DIAGNOSIS — F17210 Nicotine dependence, cigarettes, uncomplicated: Secondary | ICD-10-CM | POA: Diagnosis present

## 2017-07-08 DIAGNOSIS — F4310 Post-traumatic stress disorder, unspecified: Secondary | ICD-10-CM | POA: Diagnosis present

## 2017-07-08 DIAGNOSIS — Z91410 Personal history of adult physical and sexual abuse: Secondary | ICD-10-CM | POA: Diagnosis not present

## 2017-07-08 DIAGNOSIS — F332 Major depressive disorder, recurrent severe without psychotic features: Secondary | ICD-10-CM | POA: Diagnosis present

## 2017-07-08 DIAGNOSIS — F1210 Cannabis abuse, uncomplicated: Secondary | ICD-10-CM | POA: Diagnosis present

## 2017-07-08 DIAGNOSIS — Z8249 Family history of ischemic heart disease and other diseases of the circulatory system: Secondary | ICD-10-CM | POA: Diagnosis not present

## 2017-07-08 DIAGNOSIS — Z90710 Acquired absence of both cervix and uterus: Secondary | ICD-10-CM

## 2017-07-08 DIAGNOSIS — I10 Essential (primary) hypertension: Secondary | ICD-10-CM | POA: Diagnosis present

## 2017-07-08 DIAGNOSIS — G4700 Insomnia, unspecified: Secondary | ICD-10-CM | POA: Diagnosis present

## 2017-07-08 DIAGNOSIS — Z98891 History of uterine scar from previous surgery: Secondary | ICD-10-CM | POA: Diagnosis not present

## 2017-07-08 DIAGNOSIS — Z818 Family history of other mental and behavioral disorders: Secondary | ICD-10-CM | POA: Diagnosis not present

## 2017-07-08 DIAGNOSIS — F3132 Bipolar disorder, current episode depressed, moderate: Principal | ICD-10-CM | POA: Diagnosis present

## 2017-07-08 DIAGNOSIS — R45851 Suicidal ideations: Secondary | ICD-10-CM | POA: Diagnosis not present

## 2017-07-08 DIAGNOSIS — N898 Other specified noninflammatory disorders of vagina: Secondary | ICD-10-CM | POA: Diagnosis not present

## 2017-07-08 DIAGNOSIS — Z915 Personal history of self-harm: Secondary | ICD-10-CM | POA: Diagnosis not present

## 2017-07-08 MED ORDER — GUAIFENESIN ER 600 MG PO TB12
600.0000 mg | ORAL_TABLET | Freq: Two times a day (BID) | ORAL | Status: DC
  Administered 2017-07-09 – 2017-07-12 (×6): 600 mg via ORAL
  Filled 2017-07-08 (×11): qty 1

## 2017-07-08 MED ORDER — TRAZODONE HCL 50 MG PO TABS
50.0000 mg | ORAL_TABLET | Freq: Every evening | ORAL | Status: DC | PRN
  Administered 2017-07-08 – 2017-07-09 (×2): 50 mg via ORAL
  Filled 2017-07-08 (×5): qty 1

## 2017-07-08 MED ORDER — ACETAMINOPHEN 325 MG PO TABS
650.0000 mg | ORAL_TABLET | Freq: Four times a day (QID) | ORAL | Status: DC | PRN
  Administered 2017-07-08: 650 mg via ORAL
  Filled 2017-07-08: qty 2

## 2017-07-08 MED ORDER — MAGNESIUM HYDROXIDE 400 MG/5ML PO SUSP
30.0000 mL | Freq: Every day | ORAL | Status: DC | PRN
Start: 2017-07-08 — End: 2017-07-12

## 2017-07-08 MED ORDER — HYDRALAZINE HCL 20 MG/ML IJ SOLN: 10.0000 mg | INTRAMUSCULAR | Status: DC | PRN

## 2017-07-08 MED ORDER — HYDRALAZINE HCL 25 MG PO TABS
25.0000 mg | ORAL_TABLET | Freq: Three times a day (TID) | ORAL | Status: DC
  Administered 2017-07-08 (×2): 25 mg via ORAL
  Filled 2017-07-08 (×2): qty 1

## 2017-07-08 MED ORDER — POLYETHYLENE GLYCOL 3350 17 G PO PACK: 17.0000 g | PACK | Freq: Every day | ORAL | Status: DC | PRN

## 2017-07-08 MED ORDER — ALUM & MAG HYDROXIDE-SIMETH 200-200-20 MG/5ML PO SUSP: 30.0000 mL | ORAL | Status: DC | PRN

## 2017-07-08 MED ORDER — NICOTINE 14 MG/24HR TD PT24
14.0000 mg | MEDICATED_PATCH | TRANSDERMAL | Status: DC
  Administered 2017-07-09: 14 mg via TRANSDERMAL
  Filled 2017-07-08 (×3): qty 1

## 2017-07-08 NOTE — Tx Team (Signed)
Initial Treatment Plan 07/08/2017 11:30 PM Stacy Moss [id number]    PATIENT STRESSORS: Financial difficulties Loss of custody of children Marital or family conflict Substance abuse   PATIENT STRENGTHS: Ability for insight Average or above average intelligence Capable of independent living Communication skills Motivation for treatment/growth Physical Health   PATIENT IDENTIFIED PROBLEMS: At risk for suicide  Substance Abuse  "Problem solving"  "Anger"               DISCHARGE CRITERIA:  Ability to meet basic life and health needs Improved stabilization in mood, thinking, and/or behavior Motivation to continue treatment in a less acute level of care Need for constant or close observation no longer present Verbal commitment to aftercare and medication compliance  PRELIMINARY DISCHARGE PLAN: Attend 12-step recovery group Outpatient therapy Return to previous living arrangement  PATIENT/FAMILY INVOLVEMENT: This treatment plan has been presented to and reviewed with the patient, Stacy Moss.  The patient and family have been given the opportunity to ask questions and make suggestions.  Carleene Overlie, RN 07/08/2017, 11:30 PM

## 2017-07-08 NOTE — Care Management Note (Signed)
Case Management Note  Patient Details  Name: Stacy Moss MRN: [id number] Date of Birth: 1991/09/07  Subjective/Objective:                  Awaiting bed placement at [facility]  Action/Plan: Date:  July 08, 2017 Chart reviewed for concurrent status and case management needs.  Will continue to follow patient progress.  Discharge Planning: following for needs  Expected discharge date: [id number]  [name], BSN, [place], [place]   [phone]   Expected Discharge Date:  07/06/17               Expected Discharge Plan:  Home/Self Care  In-House Referral:     Discharge planning Services  CM Consult  Post Acute Care Choice:    Choice offered to:     DME Arranged:    DME Agency:     HH Arranged:    HH Agency:     Status of Service:  In process, will continue to follow  If discussed at Long Length of Stay Meetings, dates discussed:    Additional Comments:  [name], RN 07/08/2017, 9:51 AM
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
## 2017-07-08 NOTE — Discharge Summary (Addendum)
[facility] Discharge Summary   Patient: Stacy Moss ZOX:[id number]   PCP: Fleet Contras, MD DOB: 04/17/1991   Date of admission: 07/01/2017   Date of discharge:  07/08/2017    Discharge Diagnoses:  Principal Problem:   Overdose of antipsychotic, intentional self-harm, sequela (HCC) Active Problems:   Polysubstance abuse   Somnolence  Admitted From: Home Disposition:  Behavioral Health inpatient psychiatric admission  Recommendations for Outpatient Follow-up:  1. Follow-up with PCP in one week after discharge from [facility]   Follow-up Information    Fleet Contras, MD. Schedule an appointment as soon as possible for a visit in 1 month(s).   Specialty:  Internal Medicine Contact information: [address] [place] [address] [phone]          Diet recommendation: Regular diet  Activity: The patient is advised to gradually reintroduce usual activities.  Discharge Condition: good  Code Status: Full code  History of present illness: As per the H and P dictated on admission, "Stacy Moss is a 26 y.o. female with medical history significant for  cocaine abuse who presented to the emergency room on the evening of 9/10 stating that she had taken 30 pills of Seroquel. She was awake and conscious although somewhat drowsy. No electrolyte abnormalities. Noted to be slightly tachycardic, otherwise no arrhythmia. Case was discussed with poison control and once acetaminophen level normal, plan was to hold patient in ER psychiatry holding until she could be assessed by psychiatry the next morning. However, patient's somnolence persisted. CT scan of head and ABG were unremarkable. It was felt that likely her Seroquel was extended release and follow-up discussion with poison control stated it might be some time (greater than 24 hours) before patient was more fully awake and alert. At this point, felt best that patient be admitted and monitored for medical  clearance."  Hospital Course:  Summary of her active problems in the hospital is as following. 1. Acute encephalopathy. Drug overdose. Likely long-acting Seroquel as well as combination of cannabis and cocaine. No acute complication other then prolonged QT and somnolence, resolved now. No acute events , EKG shows mildly prolonged QT.  Medically stable.   2. Suicidal ideation. Psychiatry consulted, appreciate input Follow-up follow-up on recommendation. Currently IVC. Medically stable to be transferred to [facility].   3. Polysubstance abuse. No withdrawal in hospital. Avoid beta blockers due to positive cocaine.   4. Elevated blood pressure More likely due to heightened anxiety rather then hypertension. Will recommend to monitor for now, get psych issues under control and follow up with PCP as outpatient.   All other chronic medical condition were stable during the hospitalization.  Patient was ambulatory without any assistance. On the day of the discharge the patient's vitals were stable, and no other acute medical condition were reported by patient. the patient was felt safe to be discharge at [facility] with psychiatry.  Procedures and Results:  none   Consultations:  psych  DISCHARGE MEDICATION: Current Discharge Medication List    START taking these medications   Details  guaiFENesin (MUCINEX) 600 MG 12 hr tablet Take 1 tablet (600 mg total) by mouth 2 (two) times daily. Qty: 30 tablet, Refills: 0    nicotine (NICODERM CQ - DOSED IN MG/24 HOURS) 14 mg/24hr patch Place 1 patch (14 mg total) onto the skin daily. Qty: 28 patch, Refills: 0    polyethylene glycol (MIRALAX / GLYCOLAX) packet Take 17 g by mouth daily as needed. Qty: 14 each, Refills: 0  No Known Allergies Discharge Instructions    Diet general    Complete by:  As directed    Increase activity slowly    Complete by:  As directed      Discharge Exam: Filed Weights   07/02/17 0112  Weight: 81.6 kg  (180 lb)   Vitals:   07/08/17 1405 07/08/17 1406  BP: (!) 164/110 (!) 163/100  Pulse:  78  Resp:  17  Temp:  98.2 F (36.8 C)  SpO2:  100%   General: Appear in no distress, no Rash; Oral Mucosa moist. Cardiovascular: S1 and S2 Present, n Murmur, no JVD Respiratory: Bilateral Air entry present and Clear to Auscultation, no Crackles, no wheezes Abdomen: Bowel Sound present, Soft and no tenderness Extremities: no Pedal edema, no calf tenderness Neurology: Grossly no focal neuro deficit.  The results of significant diagnostics from this hospitalization (including imaging, microbiology, ancillary and laboratory) are listed below for reference.    Significant Diagnostic Studies: Ct Head Wo Contrast  Result Date: 07/02/2017 CLINICAL DATA:  26 year old female status post Seroquel overdose. Altered mental status, not responding. EXAM: CT HEAD WITHOUT CONTRAST TECHNIQUE: Contiguous axial images were obtained from the base of the skull through the vertex without intravenous contrast. COMPARISON:  Head CTs without contrast 09/01/2015, 02/24/2015. FINDINGS: Brain: Cerebral volume remains normal. No midline shift, ventriculomegaly, mass effect, evidence of mass lesion, intracranial hemorrhage or evidence of cortically based acute infarction. Gray-white matter differentiation is within normal limits throughout the brain. Vascular: No suspicious intracranial vascular hyperdensity. Skull: No osseous abnormality identified. Sinuses/Orbits: Visualized paranasal sinuses and mastoids are stable and well pneumatized. Other: Disconjugate gaze. Orbits soft tissues otherwise appear normal. Visualized scalp soft tissues are within normal limits. IMPRESSION: Stable and normal noncontrast CT appearance of the brain. Electronically Signed   By: Odessa Fleming M.D.   On: 07/02/2017 11:40   Dg Chest Port 1 View  Result Date: 07/02/2017 CLINICAL DATA:  Productive cough today. EXAM: PORTABLE CHEST 1 VIEW COMPARISON:  CT chest  08/28/2015. FINDINGS: Lungs are clear. Heart size is normal. No pneumothorax or pleural fluid. No bony abnormality. IMPRESSION: Normal chest. Electronically Signed   By: Drusilla Kanner M.D.   On: 07/02/2017 23:02    Microbiology: Recent Results (from the past 240 hour(s))  Culture, expectorated sputum-assessment     Status: None   Collection Time: 07/03/17  8:10 PM  Result Value Ref Range Status   Specimen Description SPUTUM  Final   Special Requests NONE  Final   Sputum evaluation THIS SPECIMEN IS ACCEPTABLE FOR SPUTUM CULTURE  Final   Report Status 07/04/2017 FINAL  Final  Culture, respiratory (NON-Expectorated)     Status: None   Collection Time: 07/03/17  8:10 PM  Result Value Ref Range Status   Specimen Description SPUTUM  Final   Special Requests NONE Reflexed from [id number]  Final   Gram Stain   Final    MODERATE WBC PRESENT, PREDOMINANTLY PMN RARE SQUAMOUS EPITHELIAL CELLS PRESENT MODERATE GRAM POSITIVE COCCI RARE GRAM POSITIVE RODS    Culture   Final    Consistent with normal respiratory flora. Performed at [facility], [address], [place], [place] [address]    Report Status 07/06/2017 FINAL  Final     Labs: CBC:  Recent Labs Lab 07/02/17 1943 07/03/17 0717 07/04/17 0618 07/05/17 0548 07/06/17 0649  WBC 8.5 8.7 8.6 6.7 5.6  HGB 11.7* 11.2* 10.3* 10.5* 11.3*  HCT 35.6* 34.0* 31.4* 31.7* 34.1*  MCV 92.2 92.4  92.9 93.0 91.7  PLT 247 234 229 238 261   Basic Metabolic Panel:  Recent Labs Lab 07/01/17 2123 07/01/17 2233 07/02/17 1943 07/03/17 0717 07/04/17 0618 07/05/17 0548 07/06/17 0649  NA 140  --   --  140 140 140 138  K 3.6  --   --  3.5 4.1 3.8 3.9  CL 107  --   --  110 110 107 105  CO2 26  --   --  GLUCOSE 101*  --   --  67 98 91 95  BUN 25*  --   --  CREATININE 0.88  --  0.86 0.91 0.83 0.69 0.65  CALCIUM 9.5  --   --  8.5* 8.6* 8.8* 9.1  MG  --  2.1  --   --  2.0  --   --    Liver Function  Tests:  Recent Labs Lab 07/01/17 2123  AST 19  ALT 11*  ALKPHOS 55  BILITOT 0.6  PROT 7.1  ALBUMIN 4.2   No results for input(s): LIPASE, AMYLASE in the last 168 hours. No results for input(s): AMMONIA in the last 168 hours. Cardiac Enzymes: No results for input(s): CKTOTAL, CKMB, CKMBINDEX, TROPONINI in the last 168 hours. BNP (last 3 results) No results for input(s): BNP in the last 8760 hours. CBG:  Recent Labs Lab 07/01/17 2136  GLUCAP 100*   Time spent: 35 minutes  Signed:  Nea Gittens  [facility]  07/08/2017  , 3:03 PM

## 2017-07-08 NOTE — Progress Notes (Signed)
Report received from admitting RN.  Met with pt 1:1.  Pt describes her mood as "okay" and her goal tonight is "to sleep."  She denies SI/HI/AH/VH.  Complains of lower left tooth pain of 9/10.  PRN medication administered for pain.  Medication administered per order.  Pt verbally contracts for safety.  She reports she will inform staff of needs and concerns.  Will continue to monitor and assess.

## 2017-07-08 NOTE — Progress Notes (Signed)
Attempted to call report and [facility] wants report after 715pm.

## 2017-07-08 NOTE — Progress Notes (Signed)
Report called to [facility], [employer] notified for transport to behavioral health. Of note pt's last blood pressure is 170/100. Medicated with po hydralazine, will recheck at this time.

## 2017-07-08 NOTE — Progress Notes (Addendum)
CSW following to assist with disposition to inpatient psychiatric hospital.  Patient IVC expired, physician completed new IVC paperwork, faxed and received by Magistrate, [employer] to serve patient.  CSW discuss placement w/ AC[facility]Tina at [facility], Will inform medical team when bed is available.   Patient has been accepted to [facility]. Will admit patient at 8:00pm today.  Room 305 Bed 1 Attending physician: Dr. Jama Flavors Nurse please call report to : [phone] before transfer.  CSW informed patient of transfer.   Nurse please call [employer] 336. [phone] to pick up patient.  IVC paperwork, in envelope on chart.   Patient requested information about [facility], CSW provided packet. Patient requested copy of patient rights, CSW informed charged nurse.  Patient has request an note indicating her hospital stay, nurse informed.    Vivi Barrack, Theresia Majors, MSW Clinical Social Worker 5E and Psychiatric Service Line [phone] 07/08/2017  10:30 AM

## 2017-07-09 ENCOUNTER — Encounter (HOSPITAL_COMMUNITY): Payer: Self-pay | Admitting: *Deleted

## 2017-07-09 DIAGNOSIS — Z91410 Personal history of adult physical and sexual abuse: Secondary | ICD-10-CM

## 2017-07-09 DIAGNOSIS — Z818 Family history of other mental and behavioral disorders: Secondary | ICD-10-CM

## 2017-07-09 DIAGNOSIS — F4310 Post-traumatic stress disorder, unspecified: Secondary | ICD-10-CM | POA: Diagnosis present

## 2017-07-09 DIAGNOSIS — F1210 Cannabis abuse, uncomplicated: Secondary | ICD-10-CM | POA: Diagnosis present

## 2017-07-09 DIAGNOSIS — F3132 Bipolar disorder, current episode depressed, moderate: Principal | ICD-10-CM

## 2017-07-09 DIAGNOSIS — R45851 Suicidal ideations: Secondary | ICD-10-CM

## 2017-07-09 DIAGNOSIS — R9431 Abnormal electrocardiogram [ECG] [EKG]: Secondary | ICD-10-CM | POA: Diagnosis present

## 2017-07-09 DIAGNOSIS — F1410 Cocaine abuse, uncomplicated: Secondary | ICD-10-CM | POA: Diagnosis present

## 2017-07-09 MED ORDER — HYDROCHLOROTHIAZIDE 12.5 MG PO CAPS
12.5000 mg | ORAL_CAPSULE | Freq: Every day | ORAL | Status: DC
  Administered 2017-07-09 – 2017-07-12 (×4): 12.5 mg via ORAL
  Filled 2017-07-09 (×8): qty 1

## 2017-07-09 MED ORDER — DIVALPROEX SODIUM ER 250 MG PO TB24
750.0000 mg | ORAL_TABLET | Freq: Every day | ORAL | Status: DC
  Administered 2017-07-09 – 2017-07-11 (×3): 750 mg via ORAL
  Filled 2017-07-09: qty 21
  Filled 2017-07-09: qty 3
  Filled 2017-07-09: qty 21
  Filled 2017-07-09 (×5): qty 3

## 2017-07-09 MED ORDER — AMLODIPINE BESYLATE 5 MG PO TABS
5.0000 mg | ORAL_TABLET | Freq: Every day | ORAL | Status: DC
  Administered 2017-07-09 – 2017-07-10 (×2): 5 mg via ORAL
  Filled 2017-07-09 (×4): qty 1

## 2017-07-09 MED ORDER — PNEUMOCOCCAL VAC POLYVALENT 25 MCG/0.5ML IJ INJ
0.5000 mL | INJECTION | INTRAMUSCULAR | Status: AC
  Administered 2017-07-10: 0.5 mL via INTRAMUSCULAR

## 2017-07-09 MED ORDER — LORAZEPAM 0.5 MG PO TABS
0.5000 mg | ORAL_TABLET | Freq: Four times a day (QID) | ORAL | Status: DC | PRN
  Administered 2017-07-10 – 2017-07-11 (×3): 0.5 mg via ORAL
  Filled 2017-07-09 (×3): qty 1

## 2017-07-09 MED ORDER — INFLUENZA VAC SPLIT QUAD 0.5 ML IM SUSY
0.5000 mL | PREFILLED_SYRINGE | INTRAMUSCULAR | Status: AC
  Administered 2017-07-10: 0.5 mL via INTRAMUSCULAR
  Filled 2017-07-09: qty 0.5

## 2017-07-09 MED ORDER — ZOLPIDEM TARTRATE 5 MG PO TABS
5.0000 mg | ORAL_TABLET | Freq: Every day | ORAL | Status: DC
  Administered 2017-07-09 – 2017-07-11 (×3): 5 mg via ORAL
  Filled 2017-07-09 (×3): qty 1

## 2017-07-09 NOTE — Progress Notes (Signed)
Received consult from nursing - pt requesting Koran.    Checked in with pt on 300 hall.  Provided Koran for pt to use during admission.     [facility] / [facility] Chaplain Burnis Kingfisher, MDiv

## 2017-07-09 NOTE — Progress Notes (Signed)
Recreation Therapy Notes  Animal-Assisted Activity (AAA) Program Checklist/Progress Notes Patient Eligibility Criteria Checklist & Daily Group note for Rec TxIntervention  Date: 09.18.2018 Time: 2:45pm Location: [address]   AAA/T Program Assumption of Risk Form signed by Patient/ or Parent Legal Guardian Yes  Patient is free of allergies or sever asthma Yes  Patient reports no fear of animals Yes  Patient reports no history of cruelty to animals Yes  Patient understands his/her participation is voluntary Yes  Behavioral Response: Did not attend  [employer], LRT/CTRS       Jearl Klinefelter 07/09/2017 2:59 PM

## 2017-07-09 NOTE — Plan of Care (Signed)
Problem: Education: Goal: Verbalization of understanding the information provided will improve Outcome: Progressing Patient verbalizes understanding of information, education provided.  Problem: Safety: Goal: Periods of time without injury will increase Outcome: Progressing Patient has not engaged in self harm and denies thoughts to do so. No SI voiced thus far today.

## 2017-07-09 NOTE — [facility] Suicide Risk Assessment (Signed)
[facility] INPATIENT:  Family/Significant Other Suicide Prevention Education  Suicide Prevention Education:  Patient Refusal for Family/Significant Other Suicide Prevention Education: The patient Stacy Moss has refused to provide written consent for family/significant other to be provided Family/Significant Other Suicide Prevention Education during admission and/or prior to discharge.  Physician notified.  SPE completed with pt, as pt refused to consent to family contact. SPI pamphlet provided to pt and pt was encouraged to share information with support network, ask questions, and talk about any concerns relating to SPE. Pt denies access to guns/firearms and verbalized understanding of information provided. Mobile Crisis information also provided to pt.   Gal Feldhaus N Smart LCSW 07/09/2017, 3:18 PM
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
## 2017-07-09 NOTE — [facility] Group Notes (Signed)
Adult Psychoeducational Group Note  Date:  07/09/2017 Time:  11:06 PM  Group Topic/Focus:  Goals Group:   The focus of this group is to help patients establish daily goals to achieve during treatment and discuss how the patient can incorporate goal setting into their daily lives to aide in recovery.  Participation Level:  Active  Participation Quality:  Appropriate  Affect:  Appropriate  Cognitive:  Appropriate  Insight: Appropriate  Engagement in Group:  Engaged  Modes of Intervention:  Discussion  Additional Comments:  Pt was alert and attentive in group. Pt goal for today was to stay clean.Pt plans to go back to work when discharged.  Dellia Nims 07/09/2017, 11:06 PM

## 2017-07-09 NOTE — [facility] Group Notes (Signed)
[facility] Group Therapy 07/09/2017 1:15pm  Type of Therapy: Mental Health Association Presentation  Participation Level: Active  Participation Quality: Attentive  Affect: Appropriate  Cognitive: Oriented  Insight: Developing/Improving  Engagement in Therapy: Engaged  Modes of Intervention: Discussion, Education and Socialization  Summary of Progress/Problems: Mental Health Association (MHA) Speaker came to talk about his personal journey with mental health. The pt processed ways by which to relate to the speaker. MHA speaker provided handouts and educational information pertaining to groups and services offered by the [facility]. Pt was engaged in speaker's presentation and was receptive to resources provided.    [employer], LCSW 07/09/2017 11:43 AM

## 2017-07-09 NOTE — H&P (Signed)
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 PTSD (post-traumatic stress disorder) [F43.10] 07/09/2017  . Bipolar disorder with moderate depression (HCC) [F31.32] 07/09/2017  . Cocaine use disorder, mild, abuse [F14.10] 07/09/2017  . Cannabis use disorder, mild, abuse [F12.10] 07/09/2017  . Prolonged Q-T interval on ECG [R94.31] 07/09/2017  . Overdose of antipsychotic, intentional self-harm, sequela (HCC) [T43.502S] 07/02/2017  . Polysubstance abuse [F19.10] 07/02/2017  . Somnolence [R40.0] 07/02/2017  . Uterine rupture during labor [O71.1] 09/11/2015  . S/p supracervical hysterectomy and bilateral salpingectomy on 09/11/15 [Z90.711] 09/11/2015  . Delayed postpartum hemorrhage x 3 episodes [O72.2] 08/16/2015  . S/P emergency cesarean section for uterine rupture on 08/11/15 [Z98.891] 08/11/2015  . Drug use complicating pregnancy in second trimester [O99.322] 03/15/2015  . History of suicidal ideation [Z86.59] 03/15/2015  . Intermittent palpitations [R00.2] 03/14/2015  . Syncopal episodes [R55] 03/09/2015   History of Present Illness: Per MD consult note- Stacy Moss is a 26 y.o. female with medical history significant for  cocaine abuse who presented to the emergency room on the evening of 9/10 stating that she had taken 30 pills of Seroquel. She was awake and conscious although somewhat drowsy. No electrolyte abnormalities. Noted to be slightly tachycardic, otherwise no arrhythmia. Case was discussed with poison control and once acetaminophen level normal, plan was to hold patient in ER psychiatry holding until she could be assessed by psychiatry the next morning. However, patient's  somnolence persisted. CT scan of head and ABG were unremarkable. It was felt that likely her Seroquel was extended release and follow-up discussion with poison control stated it might be some time (greater than 24 hours) before patient was more fully awake and alert. At this point, felt best that patient be admitted and monitored for medical clearance.  On Evaluation: Stacy Moss is awake, alert and oriented. Patient seen resting in bedroom, Patient has a flat and guarded affect. Reports she has taken multiple antidepressants in the past and has a long standing  History depression. Reports she wasn't able to see her children on yesterday and her water was off when she got home, states " I snapped and attempted to overdose." Patient is requesting to be discharge by thursday so that she is able to f/u with [facility]. Support, encouragement and reassurance was provided.   Associated Signs/Symptoms: Depression Symptoms:  depressed mood, feelings of worthlessness/guilt, suicidal attempt, anxiety, (Hypo) Manic Symptoms:  Distractibility, Impulsivity, Anxiety Symptoms:  Excessive Worry, Psychotic Symptoms:  Hallucinations: None PTSD Symptoms: Avoidance:  Decreased Interest/Participation Total Time spent with patient: 30 minutes  Past Psychiatric History:   Is the patient at risk to self? Yes.    Has the patient been a risk to self in the past 6 months? Yes.    Has the patient been a risk to self within the distant past? Yes.    Is the patient a risk to others? No.  Has the patient been a risk to others in the past 6 months? No.  Has the patient been a risk to others within the distant past? No.   Prior Inpatient Therapy:   Prior Outpatient Therapy:    Alcohol Screening: 1. How often do you have a drink containing alcohol?: 2 to 4 times a  month 2. How many drinks containing alcohol do you have on a typical day when you are drinking?: 1 or 2 3. How often do you have  six or more drinks on one occasion?: Never Preliminary Score: 0 9. Have you or someone else been injured as a result of your drinking?: No 10. Has a relative or friend or a doctor or another health worker been concerned about your drinking or suggested you cut down?: No Alcohol Use Disorder Identification Test Final Score (AUDIT): 2 Brief Intervention: AUDIT score less than 7 or less-screening does not suggest unhealthy drinking-brief intervention not indicated Substance Abuse History in the last 12 months:  No. Consequences of Substance Abuse: NA Previous Psychotropic Medications: YES Psychological Evaluations: YES Past Medical History:  Past Medical History:  Diagnosis Date  . ADD (attention deficit disorder)   . Anxiety   . Bipolar affective (HCC)   . Depression   . History of cocaine use 10/2014  . History of gestational diabetes mellitus (GDM)    with prior pregnancy  . History of marijuana use   . History of suicidal ideation   . History of VBAC   . Hx of eye surgery 02/2015  . Hx of tonsillectomy   . Hypertension    on meds 2011  . Mental disorder   . Polysubstance abuse 10/2014   Ectasy use  . S/P cesarean section 05/25/2012  . S/p supracervical hysterectomy and bilateral salpingectomy on 09/11/15 09/11/2015  . Seizures [facility])    age [age]- after fell and hit head  . Smoker     Past Surgical History:  Procedure Laterality Date  . ABDOMINAL HYSTERECTOMY N/A 09/11/2015   Procedure: SUPRACERVICAL ABDONIMAL HYSTERECTOMY, ;  Surgeon: Tereso Newcomer, MD;  Location: [facility] ORS;  Service: Gynecology;  Laterality: N/A;  . BILATERAL SALPINGECTOMY  09/11/2015   Procedure: BILATERAL SALPINGECTOMY;  Surgeon: Tereso Newcomer, MD;  Location: [facility] ORS;  Service: Gynecology;;  . CESAREAN SECTION  05/25/2012   Procedure: CESAREAN SECTION;  Surgeon: Sherron Monday, MD;  Location: [facility] ORS;  Service: Gynecology;  Laterality: N/A;  Primary cesarean section with delivery of baby girl at 0700.  Apgars9/9.  Marland Kitchen CESAREAN SECTION N/A 08/11/2015   Procedure: CESAREAN SECTION;  Surgeon: Lazaro Arms, MD;  Location: [facility] ORS;  Service: Obstetrics;  Laterality: N/A;  . ORIF ORBITAL FRACTURE Left 02/25/2015   Procedure: OPEN REDUCTION INTERNAL FIXATION (ORIF) LEFT ORBITAL FRACTURE;  Surgeon: Melvenia Beam, MD;  Location: [facility] OR;  Service: ENT;  Laterality: Left;  . TONSILLECTOMY     Family History:  Family History  Problem Relation Age of Onset  . Cancer Maternal Grandmother   . Hypertension Maternal Grandmother   . Hypertension Mother   . Anesthesia problems Neg Hx    Family Psychiatric  History:  Tobacco Screening: Have you used any form of tobacco in the last 30 days? (Cigarettes, Smokeless Tobacco, Cigars, and/or Pipes): Yes Tobacco use, Select all that apply: 5 or more cigarettes per day Are you interested in Tobacco Cessation Medications?: No, patient refused Counseled patient on smoking cessation including recognizing danger situations, developing coping skills and basic information about quitting provided: Yes Social History:  History  Alcohol Use No    Comment: Former EtOH abuse     History  Drug Use  . Types: Marijuana    Comment: History of cocaine and ecstasy use, last Jan 2016.    Additional Social History:  Allergies:  No Known Allergies Lab Results: No results found for this or any previous visit (from the past 48 hour(s)).  Blood Alcohol level:  Lab Results  Component Value Date   ETH <5 07/01/2017   [facility]  07/07/2010    <5        LOWEST DETECTABLE LIMIT FOR SERUM ALCOHOL IS 5 mg/dL FOR MEDICAL PURPOSES ONLY    Metabolic Disorder Labs:  No results found for: HGBA1C, MPG No results found for: PROLACTIN No results found for: CHOL, TRIG, HDL, CHOLHDL, VLDL, LDLCALC  Current Medications: Current Facility-Administered Medications  Medication Dose Route Frequency Provider Last Rate Last Dose  . acetaminophen (TYLENOL)  tablet 650 mg  650 mg Oral Q6H PRN Kerry Hough, PA-C   650 mg at 07/08/17 2302  . alum & mag hydroxide-simeth (MAALOX/MYLANTA) 200-200-20 MG/5ML suspension 30 mL  30 mL Oral Q4H PRN Donell Sievert E, PA-C      . amLODipine (NORVASC) tablet 5 mg  5 mg Oral Daily Oneta Rack, NP      . divalproex (DEPAKOTE ER) 24 hr tablet 750 mg  750 mg Oral QHS Eappen, Saramma, MD      . guaiFENesin (MUCINEX) 12 hr tablet 600 mg  600 mg Oral BID Simon, Spencer E, PA-C      . hydrochlorothiazide (MICROZIDE) capsule 12.5 mg  12.5 mg Oral Daily Oneta Rack, NP      . [START ON 07/10/2017] Influenza vac split quadrivalent PF (FLUARIX) injection 0.5 mL  0.5 mL Intramuscular Tomorrow-1000 Simon, Spencer E, PA-C      . LORazepam (ATIVAN) tablet 0.5 mg  0.5 mg Oral Q6H PRN Eappen, Saramma, MD      . magnesium hydroxide (MILK OF MAGNESIA) suspension 30 mL  30 mL Oral Daily PRN Donell Sievert E, PA-C      . nicotine (NICODERM CQ - dosed in mg/24 hours) patch 14 mg  14 mg Transdermal Q24H Donell Sievert E, PA-C      . [START ON 07/10/2017] pneumococcal 23 valent vaccine (PNU-IMMUNE) injection 0.5 mL  0.5 mL Intramuscular Tomorrow-1000 Simon, Spencer E, PA-C      . polyethylene glycol (MIRALAX / GLYCOLAX) packet 17 g  17 g Oral Daily PRN Donell Sievert E, PA-C      . zolpidem (AMBIEN) tablet 5 mg  5 mg Oral QHS Eappen, Saramma, MD       PTA Medications: Prescriptions Prior to Admission  Medication Sig Dispense Refill Last Dose  . guaiFENesin (MUCINEX) 600 MG 12 hr tablet Take 1 tablet (600 mg total) by mouth 2 (two) times daily. 30 tablet 0   . nicotine (NICODERM CQ - DOSED IN MG/24 HOURS) 14 mg/24hr patch Place 1 patch (14 mg total) onto the skin daily. 28 patch 0   . polyethylene glycol (MIRALAX / GLYCOLAX) packet Take 17 g by mouth daily as needed. 14 each 0     Musculoskeletal: Strength & Muscle Tone: within normal limits Gait & Station: normal Patient leans: N/A  Psychiatric Specialty Exam: Physical  Exam  Vitals reviewed. Constitutional: She is oriented to person, place, and time. She appears well-developed.  Cardiovascular: Normal rate.   Neurological: She is alert and oriented to person, place, and time.  Psychiatric: She has a normal mood and affect. Her behavior is normal.    Review of Systems  Psychiatric/Behavioral: Positive for depression and suicidal ideas. The patient has insomnia.     Blood pressure (!) 150/110, pulse 87, temperature 98 F (36.7  C), temperature source Oral, resp. rate 16, height  (1.702 m), weight 87.5 kg (193 lb), last menstrual period 10/22/2014, not currently breastfeeding.Body mass index is 30.23 kg/m.  General Appearance: Casual  Eye Contact:  Fair  Speech:  Clear and Coherent  Volume:  Normal  Mood:  Anxious and Depressed  Affect:  Depressed and Flat  Thought Process:  Coherent  Orientation:  Full (Time, Place, and Person)  Thought Content:  Hallucinations: None  Suicidal Thoughts:  Yes.  with intent/plan  Homicidal Thoughts:  No  Judgement:  Poor  Insight:  Shallow  Psychomotor Activity:  Normal  Concentration:  Concentration: Fair  Recall:  Fair  Fund of Knowledge:  Fair  Language:  Good  Akathisia:  No  Handed:  Right  AIMS (if indicated):     Assets:  Communication Skills Desire for Improvement Resilience Social Support  ADL's:  Intact  Cognition:  WNL  Sleep:  Number of Hours: 5.25     I agree with current treatment plan on 07/09/2017, Patient seen face-to-face for psychiatric evaluation follow-up, chart reviewed and case discussed with the MD Eappen.  Reviewed the information documented and agree with the treatment plan.  Treatment Plan Summary: Daily contact with patient to assess and evaluate symptoms and progress in treatment and Medication management   Start Depakote 750 mg for mood stabilization. - Valproic level 07/13/2017 Continue with Trazodone 100 mg for insomnia -Start Norvase 5 mg and HCTZ 12.5mg  for HTN    Will continue to monitor vitals ,medication compliance and treatment side effects while patient is here.  Reviewed labs: EKG- pending results BAL - 198, UDS - pos for cocaine, thc and benzodizpines. CSW will start working on disposition.  Patient to participate in therapeutic milieu   Observation Level/Precautions:  15 minute checks  Laboratory:  CBC Chemistry Profile HCG UA repeat EKG: due to prolonged QTc  Psychotherapy:  Individual and group session  Medications:  See above  Consultations:  Psychiatry  Discharge Concerns: Safety, stabilization, and risk of access to medication and medication stabilization    Estimated LOS: 5-7 days  Other:     Physician Treatment Plan for Primary Diagnosis: Bipolar disorder with moderate depression (HCC) Long Term Goal(s): Improvement in symptoms so as ready for discharge  Short Term Goals: Ability to identify changes in lifestyle to reduce recurrence of condition will improve, Ability to verbalize feelings will improve, Ability to disclose and discuss suicidal ideas and Ability to demonstrate self-control will improve  Physician Treatment Plan for Secondary Diagnosis: Principal Problem:   Bipolar disorder with moderate depression (HCC) Active Problems:   PTSD (post-traumatic stress disorder)   Cocaine use disorder, mild, abuse   Cannabis use disorder, mild, abuse   Prolonged Q-T interval on ECG  Long Term Goal(s): Improvement in symptoms so as ready for discharge  Short Term Goals: Ability to verbalize feelings will improve, Ability to maintain clinical measurements within normal limits will improve and Compliance with prescribed medications will improve  I certify that inpatient services furnished can reasonably be expected to improve the patient's condition.    Oneta Rack, NP 9/18/20181:22 PM

## 2017-07-09 NOTE — Progress Notes (Signed)
D: Patient observed isolative to room, bed. Minimal information provided however patient quite sleepy. Patient denies any complaints to this Clinical research associate. Patient's affect flat, mood depressed however per self inventory, rates depression,  hopelessness and anxiety all at a 0/10. Rates sleep as good, appetite as good, energy as normal and concentration as good.  States goal for today is "sleeping." Denies pain, physical problems.   A: Offered scheduled mucinex however patient refused. No prns requested or required. Level III obs in place for safety. Emotional support offered and self inventory reviewed. Encouraged completion of Suicide Safety Plan and programming participation. Discussed POC with MD, SW.     R: Patient verbalizes understanding of POC. Patient denies SI/HI. Endorsing AVH. Remains safe on level III obs. Will continue to monitor closely and make verbal contact frequently.

## 2017-07-10 DIAGNOSIS — R450 Nervousness: Secondary | ICD-10-CM

## 2017-07-10 DIAGNOSIS — R400 Somnolence: Secondary | ICD-10-CM

## 2017-07-10 DIAGNOSIS — N898 Other specified noninflammatory disorders of vagina: Secondary | ICD-10-CM

## 2017-07-10 DIAGNOSIS — F1210 Cannabis abuse, uncomplicated: Secondary | ICD-10-CM

## 2017-07-10 DIAGNOSIS — F17210 Nicotine dependence, cigarettes, uncomplicated: Secondary | ICD-10-CM

## 2017-07-10 DIAGNOSIS — F1910 Other psychoactive substance abuse, uncomplicated: Secondary | ICD-10-CM

## 2017-07-10 DIAGNOSIS — F1410 Cocaine abuse, uncomplicated: Secondary | ICD-10-CM

## 2017-07-10 DIAGNOSIS — R9431 Abnormal electrocardiogram [ECG] [EKG]: Secondary | ICD-10-CM

## 2017-07-10 DIAGNOSIS — F4310 Post-traumatic stress disorder, unspecified: Secondary | ICD-10-CM

## 2017-07-10 DIAGNOSIS — F39 Unspecified mood [affective] disorder: Secondary | ICD-10-CM

## 2017-07-10 DIAGNOSIS — F419 Anxiety disorder, unspecified: Secondary | ICD-10-CM

## 2017-07-10 DIAGNOSIS — G4700 Insomnia, unspecified: Secondary | ICD-10-CM

## 2017-07-10 LAB — HEMOGLOBIN A1C
HEMOGLOBIN A1C: 5.2 % (ref 4.8–5.6)
MEAN PLASMA GLUCOSE: 102.54 mg/dL

## 2017-07-10 LAB — WET PREP, GENITAL
Sperm: NONE SEEN
TRICH WET PREP: NONE SEEN
YEAST WET PREP: NONE SEEN

## 2017-07-10 LAB — LIPID PANEL
Cholesterol: 152 mg/dL (ref 0–200)
HDL: 52 mg/dL (ref >40)
LDL CALC: 86 mg/dL (ref 0–99)
Total CHOL/HDL Ratio: 2.9 RATIO
Triglycerides: 70 mg/dL (ref <150)
VLDL: 14 mg/dL (ref 0–40)

## 2017-07-10 LAB — TSH: TSH: 2.351 u[IU]/mL (ref 0.350–4.500)

## 2017-07-10 MED ORDER — AMLODIPINE BESYLATE 10 MG PO TABS
10.0000 mg | ORAL_TABLET | Freq: Every day | ORAL | Status: DC
  Administered 2017-07-11 – 2017-07-12 (×2): 10 mg via ORAL
  Filled 2017-07-10: qty 1
  Filled 2017-07-10: qty 7
  Filled 2017-07-10: qty 1
  Filled 2017-07-10: qty 7
  Filled 2017-07-10: qty 1

## 2017-07-10 MED ORDER — NICOTINE 14 MG/24HR TD PT24
14.0000 mg | MEDICATED_PATCH | TRANSDERMAL | Status: DC
  Administered 2017-07-10: 14 mg via TRANSDERMAL
  Filled 2017-07-10 (×4): qty 1

## 2017-07-10 MED ORDER — METRONIDAZOLE 500 MG PO TABS
500.0000 mg | ORAL_TABLET | Freq: Two times a day (BID) | ORAL | Status: DC
  Administered 2017-07-10 – 2017-07-12 (×5): 500 mg via ORAL
  Filled 2017-07-10: qty 1
  Filled 2017-07-10: qty 9
  Filled 2017-07-10: qty 1
  Filled 2017-07-10: qty 9
  Filled 2017-07-10: qty 1
  Filled 2017-07-10: qty 9
  Filled 2017-07-10: qty 1
  Filled 2017-07-10: qty 2
  Filled 2017-07-10: qty 9
  Filled 2017-07-10 (×3): qty 1

## 2017-07-10 NOTE — [facility] Group Notes (Signed)
LCSW Group Therapy Note  07/10/2017 1:15pm  Type of Therapy/Topic:  Group Therapy:  Emotion Regulation  Participation Level:  Active   Description of Group:   The purpose of this group is to assist patients in learning to regulate negative emotions and experience positive emotions. Patients will be guided to discuss ways in which they have been vulnerable to their negative emotions. These vulnerabilities will be juxtaposed with experiences of positive emotions or situations, and patients will be challenged to use positive emotions to combat negative ones. Special emphasis will be placed on coping with negative emotions in conflict situations, and patients will process healthy conflict resolution skills.  Therapeutic Goals: 1. Patient will identify two positive emotions or experiences to reflect on in order to balance out negative emotions 2. Patient will label two or more emotions that they find the most difficult to experience 3. Patient will demonstrate positive conflict resolution skills through discussion and/or role plays  Summary of Patient Progress:   Kasumi was attentive and engaged during today's processing group. She shared that her most difficult emotion to regulate is "grief." "I've lost myself through the drug use and all the stuff I did to myself." Dorthula also recognizes that she must put herself and her mental/physical well being first in order to maintain sobriety and get herself to a point where she can get her children back and lead a fulfilling life. "I want to maintain this motivation and positive attitude. "I can also already tell the medication is working."     Therapeutic Modalities:   Cognitive Behavioral Therapy Feelings Identification Dialectical Behavioral Therapy   Ledell Peoples Smart, LCSW 07/10/2017 4:09 PM

## 2017-07-10 NOTE — Progress Notes (Signed)
Pt has been observed in the dayroom most of the evening talking with peers and playing cards.  She reports that she is doing better this evening than this morning.  She denies SI/HI/AVH at this time.  She asked questions about her medications and what she was ordered for tonight.  Pt voices understanding.  She was started on anti-hypertensive medications earlier in the day, but her BP was elevated before bedtime.  Pt states that she has had BP issues for sometime.  Pt denies having any withdrawal symptoms, but thinks maybe the elevated BP may be a result of her drinking.  Support and encouragement offered.  Pt makes her needs known to staff.  Discharge plans are in process.  Safety maintained with q15 minute checks.

## 2017-07-10 NOTE — Progress Notes (Signed)
Pt approached nurses station at shift change and requested that the channel on the television to be changed. This Clinical research associate took the remote to the dayroom to change the station but two other patients reported watching the movie that was on.  Pt came back to nurses station upset and complaining that another patient shouldn't be allowed to watch the television while she also had a visitor. Pt reported in front of the nurses station that the reason she should be abe to watch what she wanted was because her visitor did not show up.  It was explained by this writer that the channel is not changed in the middle of other patients viewing a program if they still would like to watch it. Day shift technician Francis Dowse also made the decision that we would leave the movie on, as it only had fifteen more minutes and patients were still watching it.   Patients reported that she continued to complain so they requested that I change the channel. This Clinical research associate asked if the movie was over and those patients reported no, but the main part they wanted to see was. When I returned with the remote I asked what they would want to watch. No one answered. I offered for this patient who was upset to choose what she would like to watch next and the patient did not respond. I questioned her as to why she no longer cares to change the channel after requesting it be done. Pt responded, " I see you will change the channel when she asks, but not when I ask. I guess that is maybe because I am the only black person in here".The patient started cursing and yelling at two other patients, even saying vulgar things about the patient and visitor while the visitor was still in the dayroom.  It was explained to patient that the channel is able to be changed now because the patients who were watching the movie are no longer wanting to watch it.   The patient continued to complain and this writer told her if she continues the dayroom would close like it did during  the daytime as it was reported to me by day shift, because of the arguing. The patient was told by this writer not to ask to change the channel again if it was not what the patient really wanted. And that just because her visitor did not show up, she could not change the channel on other patients.  The patient got increasingly mad and said that I was trying to embarrass her and that it was a HIPPA violation to share in front of people that her visitor did not come. Pt self reported that her visitors did not show,at the nurses station and in front of others as a reason as to why she should be able to watch what she wanted. This writer told the patient that it seemed like she was just wanting to fight.The patient replied with" oh you want to fight then just wait until I get out of here I know where you work".  At this time, nurses came into the dayroom to de- escalate the patient and I went into the galley to remove myself from the situation since she was upset at me.

## 2017-07-10 NOTE — Progress Notes (Signed)
Recreation Therapy Notes  Date: 07/10/17 Time: 0930 Location: [address] Dayroom  Group Topic: Stress Management  Goal Area(s) Addresses:  Patient will verbalize importance of using healthy stress management.  Patient will identify positive emotions associated with healthy stress management.   Behavioral Response: Engaged  Intervention: Stress Management  Activity :  Progressive Muscle Relaxation.  LRT introduced the stress management technique of progressive muscle relaxation.  LRT read a script that instructed patients to tense then relax each muscle group individually.  Patients were to follow along as the script was read to fully engage in the practice.  Education:  Stress Management, Discharge Planning.   Education Outcome: Acknowledges edcuation/In group clarification offered/Needs additional education  Clinical Observations/Feedback: Pt attended group.    Caroll Rancher, LRT/CTRS         Lillia Abed, Zaydon Kinser A 07/10/2017 12:12 PM
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
## 2017-07-10 NOTE — Progress Notes (Signed)
Patient did attend  the evening speaker NA meeting but entered the meeting fifteen minutes after it started. Pt was speaking to nurse during the first part of group.
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
## 2017-07-10 NOTE — Progress Notes (Signed)
D: Pt presents with an animated affect and anxious mood. Pt reported feeling better today and feels that she has a boost in energy level today. Pt stated that her goal today is to continue working on her depression. Pt expressed that the she was started on Depakote yesterday and can tell a difference in her mood today. Pt reports decreased depression and anxiety today. Pt denies SI. Pt compliant with med. No side effects to meds verbalized by pt. Pt c/o an odor/vaginal discharge. Specimens collected today per NP. Pt started on flagyl per NP., for possible BV.  A: Medications reviewed with pt. Medications administered as ordered per MD. Verbal support provided. Pt encouraged to attend groups. 15 minute checks performed for safety.  R: Pt compliant with tx. Pt verbalizes understanding of med regimen.

## 2017-07-11 LAB — URINE CULTURE: Culture: NO GROWTH

## 2017-07-11 LAB — GC/CHLAMYDIA PROBE AMP (~~LOC~~) NOT AT ARMC
CHLAMYDIA, DNA PROBE: NEGATIVE
NEISSERIA GONORRHEA: NEGATIVE

## 2017-07-11 LAB — PROLACTIN: PROLACTIN: 20 ng/mL (ref 4.8–23.3)

## 2017-07-11 NOTE — [facility] Group Notes (Signed)
LCSW Group Therapy Note  07/11/2017 1:15pm  Type of Therapy and Topic:  Group Therapy: Avoiding Self-Sabotaging and Enabling Behaviors  Participation Level:  Active   Description of Group:   In this group, patients will learn how to identify obstacles, self-sabotaging and enabling behaviors, as well as: what are they, why do we do them and what needs these behaviors meet. Discuss unhealthy relationships and how to have positive healthy boundaries with those that sabotage and enable. Explore aspects of self-sabotage and enabling in yourself and how to limit these self-destructive behaviors in everyday life.   Therapeutic Goals: 1. Patient will identify one obstacle that relates to self-sabotage and enabling behaviors 2. Patient will identify one personal self-sabotaging or enabling behavior they did prior to admission 3. Patient will state a plan to change the above identified behavior 4. Patient will demonstrate ability to communicate their needs through discussion and/or role play.   Summary of Patient Progress:  Pt active and participated, difficult to redirect at times. Identified procrastination and not feeling like she is worthwhile as two methods of self sabotage that fit her. She continues to show progress in the group setting with improving insight.    Therapeutic Modalities:   Cognitive Behavioral Therapy Person-Centered Therapy Motivational Interviewing   [employer], LCSW 07/11/2017 12:57 PM
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
## 2017-07-11 NOTE — Progress Notes (Signed)
Patient denies SI, HI and AVH.  Patient reports increase in mood.  Patient has participated in groups and been compliant with medications.  Patient is free of all behavioral dyscontrol.   Assess patient for safety, offer medications as prescribed, engage patient in 1:1 staff talks.   Continue to monitor as prescribed patient able to contract for safety.  

## 2017-07-11 NOTE — Progress Notes (Signed)
CSW checked on patient this morning--[facility] application still not completed despite encouragement by CSW. Pt is aware that she will likely not be directly going to [facility] from [facility] but that getting on the waitlist prior to discharge was necessary. Pt given [facility] information. CSW providing pt with Forensic scientist as well. Pt sleeping this morning and not able to fully engage with CSW. Mindfulness packet left with pt per her request yesterday.  Trula Slade, MSW, LCSW Clinical Social Worker 07/11/2017 11:01 AM

## 2017-07-11 NOTE — Progress Notes (Signed)
Messages left for pt's CPS worker per her request: Eunice Blase [phone]. Number verified with patient--different name on voicemail. No patient information provided on voicemail.  Trula Slade, MSW, LCSW Clinical Social Worker 07/11/2017 11:52 AM

## 2017-07-11 NOTE — Progress Notes (Signed)
Adult Psychoeducational Group Note  Date:  07/11/2017 Time:  2045 Group Topic/Focus:  wrap up group  Participation Level:  Active  Participation Quality:  Inattentive, Monopolizing, Redirectable, Sharing and Supportive  Affect:  Labile  Cognitive:  Appropriate  Insight: Improving  Engagement in Group:  Improving  Modes of Intervention:  Clarification, Education and Support  Additional Comments:  Pt shared that if she could change any one thing in her life it would be to have remained a virgin. Although patient reports being grateful for her three children. Pt shared that she has made good friends here.   Johann Capers S 07/11/2017, 11:08 PM

## 2017-07-12 DIAGNOSIS — T1491XA Suicide attempt, initial encounter: Secondary | ICD-10-CM

## 2017-07-12 DIAGNOSIS — T43592A Poisoning by other antipsychotics and neuroleptics, intentional self-harm, initial encounter: Secondary | ICD-10-CM

## 2017-07-12 MED ORDER — METRONIDAZOLE 500 MG PO TABS: 500.0000 mg | ORAL_TABLET | Freq: Two times a day (BID) | ORAL | 0 refills | Status: DC

## 2017-07-12 MED ORDER — DIVALPROEX SODIUM ER 250 MG PO TB24: 750.0000 mg | ORAL_TABLET | Freq: Every day | ORAL | 0 refills | Status: DC

## 2017-07-12 MED ORDER — AMLODIPINE BESYLATE 10 MG PO TABS: 10.0000 mg | ORAL_TABLET | Freq: Every day | ORAL | 0 refills | Status: DC

## 2017-07-12 MED ORDER — GUAIFENESIN ER 600 MG PO TB12: 600.0000 mg | ORAL_TABLET | Freq: Two times a day (BID) | ORAL | 0 refills | Status: DC

## 2017-07-12 MED ORDER — HYDROCHLOROTHIAZIDE 12.5 MG PO CAPS: 12.5000 mg | ORAL_CAPSULE | Freq: Every day | ORAL | 0 refills | Status: DC

## 2017-07-12 NOTE — Progress Notes (Signed)
Recreation Therapy Notes  Date: 07/12/17 Time: 0930 Location: [address] Group Room  Group Topic: Stress Management  Goal Area(s) Addresses:  Patient will verbalize importance of using healthy stress management.  Patient will identify positive emotions associated with healthy stress management.   Intervention: Stress Management  Activity :  Guided Imagery.  LRT introduced the stress management technique of guided imagery to patients.  LRT read a script that allowed patients to embark on a mental vacation to relax and get away from their everyday routine.  Patients were to follow along as the script was read to participate in the activity.  Education:  Stress Management, Discharge Planning.   Education Outcome: Acknowledges edcuation/In group clarification offered/Needs additional education  Clinical Observations/Feedback: Pt did not attend group.   Caroll Rancher, LRT/CTRS         Caroll Rancher A 07/12/2017 11:38 AM

## 2017-07-12 NOTE — Progress Notes (Signed)
DAR Note: Pt interacted with patient at med pass. Observed pt to be anxious. Pt notes she will be discharge this morning asap r/t needing to have utilities cut back on. At time of assessment denies SI/HI/AVH. Pt was flat. Pt was med compliant. Pt expresses the need of clothing. Writer gave pt clothing. Pt showered and clothed. Staff will continue to monitor, meet needs, and maintain safety.

## 2017-07-12 NOTE — Progress Notes (Signed)
Pt discharged educated on follow up appointments/educated on medications. Pt has all belongings, exception to robe which she discarded. Pt contracted she will follow her suicidal safety plan. Writer walked pt out to the lobby.

## 2017-07-12 NOTE — Progress Notes (Signed)
DAR Note: Pt observed in room interaction with roommate. Pt at the time of assessment denied any SI, HI, depression, anxiety, or AVH; states, "I should be leaving in AM and I can't wait." Pt was flat. Did not look to be in any acute distress. Pt was med compliant. All patient's questions and concerns addressed. Pt attended wrap-up group. Pt also observed interaction loudly with peers.
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
## 2017-07-12 NOTE — Progress Notes (Signed)
  [facility] Adult Case Management Discharge Plan :  Will you be returning to the same living situation after discharge:  Yes,  pt returning home. At discharge, do you have transportation home?: Yes,  pt has access to transportation. Do you have the ability to pay for your medications: Yes,  prescriptions and samples provided.  Release of information consent forms completed and in the chart;  Patient's signature needed at discharge.  Patient to Follow up at: Follow-up Information    [facility] Follow up on 07/18/2017.   Specialty:  Behavioral Health Why:  Hospital follow-up on Thursday at 8:15AM. Please bring picture ID and hospital discharge paperwork if you have it. Thank you.  Contact [address] [place] [address] [phone]        [facility] Follow up.   Specialty:  Behavioral Health Why:  Social worker has made a referral on your behalf. Please continue to call and check for bed availability. Contact information: [address] [place] [address] [phone]          Next level of care provider has access to [facility] Link:no  Safety Planning and Suicide Prevention discussed: Yes,  with pt.  Have you used any form of tobacco in the last 30 days? (Cigarettes, Smokeless Tobacco, Cigars, and/or Pipes): Yes  Has patient been referred to the Quitline?: Patient refused referral  Patient has been referred for addiction treatment: Yes   Pt completed [facility] application and CSW faxed referral.  Jonathon Jordan, MSW, LCSWA 07/12/2017, 9:46 AM

## 2017-07-17 ENCOUNTER — Encounter (HOSPITAL_COMMUNITY): Payer: Self-pay | Admitting: Family Medicine

## 2017-07-17 ENCOUNTER — Telehealth: Payer: Self-pay | Admitting: Emergency Medicine

## 2017-07-17 ENCOUNTER — Emergency Department (HOSPITAL_COMMUNITY)
Admission: EM | Admit: 2017-07-17 | Discharge: 2017-07-17 | Disposition: A | Discharge status: Home / Self Care | Payer: Self-pay | Attending: Emergency Medicine | Admitting: Emergency Medicine

## 2017-07-17 ENCOUNTER — Emergency Department (HOSPITAL_COMMUNITY): Payer: Self-pay

## 2017-07-17 DIAGNOSIS — R1909 Other intra-abdominal and pelvic swelling, mass and lump: Secondary | ICD-10-CM | POA: Insufficient documentation

## 2017-07-17 DIAGNOSIS — I10 Essential (primary) hypertension: Secondary | ICD-10-CM | POA: Insufficient documentation

## 2017-07-17 DIAGNOSIS — Z8742 Personal history of other diseases of the female genital tract: Secondary | ICD-10-CM | POA: Insufficient documentation

## 2017-07-17 DIAGNOSIS — R634 Abnormal weight loss: Secondary | ICD-10-CM | POA: Insufficient documentation

## 2017-07-17 DIAGNOSIS — Z79899 Other long term (current) drug therapy: Secondary | ICD-10-CM | POA: Insufficient documentation

## 2017-07-17 DIAGNOSIS — N9489 Other specified conditions associated with female genital organs and menstrual cycle: Secondary | ICD-10-CM

## 2017-07-17 DIAGNOSIS — F17210 Nicotine dependence, cigarettes, uncomplicated: Secondary | ICD-10-CM | POA: Insufficient documentation

## 2017-07-17 LAB — COMPREHENSIVE METABOLIC PANEL
ALT: 15 U/L (ref 14–54)
AST: 17 U/L (ref 15–41)
Albumin: 4 g/dL (ref 3.5–5.0)
Alkaline Phosphatase: 42 U/L (ref 38–126)
Anion gap: 7 (ref 5–15)
BILIRUBIN TOTAL: 0.6 mg/dL (ref 0.3–1.2)
BUN: 14 mg/dL (ref 6–20)
CO2: 26 mmol/L (ref 22–32)
CREATININE: 0.64 mg/dL (ref 0.44–1.00)
Calcium: 9.2 mg/dL (ref 8.9–10.3)
Chloride: 105 mmol/L (ref 101–111)
Glucose, Bld: 94 mg/dL (ref 65–99)
Potassium: 3.8 mmol/L (ref 3.5–5.1)
Sodium: 138 mmol/L (ref 135–145)
TOTAL PROTEIN: 7.3 g/dL (ref 6.5–8.1)

## 2017-07-17 LAB — CBC
HCT: 35.3 % — ABNORMAL LOW (ref 36.0–46.0)
Hemoglobin: 11.7 g/dL — ABNORMAL LOW (ref 12.0–15.0)
MCH: 30.5 pg (ref 26.0–34.0)
MCHC: 33.1 g/dL (ref 30.0–36.0)
MCV: 91.9 fL (ref 78.0–100.0)
PLATELETS: 298 10*3/uL (ref 150–400)
RBC: 3.84 MIL/uL — AB (ref 3.87–5.11)
RDW: 12.8 % (ref 11.5–15.5)
WBC: 8.4 10*3/uL (ref 4.0–10.5)

## 2017-07-17 LAB — URINALYSIS, ROUTINE W REFLEX MICROSCOPIC
Bilirubin Urine: NEGATIVE
GLUCOSE, UA: NEGATIVE mg/dL
Hgb urine dipstick: NEGATIVE
KETONES UR: NEGATIVE mg/dL
LEUKOCYTES UA: NEGATIVE
NITRITE: NEGATIVE
PROTEIN: NEGATIVE mg/dL
Specific Gravity, Urine: 1.006 (ref 1.005–1.030)
pH: 8 (ref 5.0–8.0)

## 2017-07-17 LAB — LIPASE, BLOOD: Lipase: 29 U/L (ref 11–51)

## 2017-07-17 MED ORDER — MORPHINE SULFATE (PF) 4 MG/ML IV SOLN
4.0000 mg | Freq: Once | INTRAVENOUS | Status: AC
  Administered 2017-07-17: 4 mg via INTRAVENOUS
  Filled 2017-07-17: qty 1

## 2017-07-17 MED ORDER — TRAMADOL HCL 50 MG PO TABS: 50.0000 mg | ORAL_TABLET | Freq: Four times a day (QID) | ORAL | 0 refills | Status: DC | PRN

## 2017-07-17 MED ORDER — NAPROXEN 500 MG PO TABS: 500.0000 mg | ORAL_TABLET | Freq: Two times a day (BID) | ORAL | 0 refills | Status: AC

## 2017-07-17 MED ORDER — OXYCODONE-ACETAMINOPHEN 5-325 MG PO TABS
2.0000 | ORAL_TABLET | Freq: Once | ORAL | Status: AC
  Administered 2017-07-17: 2 via ORAL
  Filled 2017-07-17: qty 2

## 2017-07-17 MED ORDER — KETOROLAC TROMETHAMINE 15 MG/ML IJ SOLN
15.0000 mg | Freq: Once | INTRAMUSCULAR | Status: AC
  Administered 2017-07-17: 15 mg via INTRAVENOUS
  Filled 2017-07-17: qty 1

## 2017-07-17 MED ORDER — SODIUM CHLORIDE 0.9 % IV BOLUS (SEPSIS)
1000.0000 mL | Freq: Once | INTRAVENOUS | Status: AC
  Administered 2017-07-17: 1000 mL via INTRAVENOUS
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
## 2017-07-17 NOTE — ED Provider Notes (Signed)
[facility]-EMERGENCY DEPT Provider Note   CSN: [id number] Arrival date & time: 07/17/17  0225     History   Chief Complaint Chief Complaint  Patient presents with  . Abdominal Pain  . Sore Throat    HPI Stacy Moss is a 26 y.o. female.  HPI   26 year old female with past medical history as below who presents with left abdominal pain. The patient states that for the last several weeks, she has had intermittent left adnexal pain. Over the last week, she has had gradual onset, progressively worsening aching, throbbing left lower quadrant pain. She has a history of ovarian cysts and reportedly was scheduled to see an OB in the past, but missed her follow-up appointment. She has had associated mild weight loss. Denies any nausea or vomiting. No change in bowel habits. She is status post hysterectomy and has had no vaginal bleeding or discharge. No recent trauma.  Past Medical History:  Diagnosis Date  . ADD (attention deficit disorder)   . Anxiety   . Bipolar affective (HCC)   . Depression   . History of cocaine use 10/2014  . History of gestational diabetes mellitus (GDM)    with prior pregnancy  . History of marijuana use   . History of suicidal ideation   . History of VBAC   . Hx of eye surgery 02/2015  . Hx of tonsillectomy   . Hypertension    on meds 2011  . Mental disorder   . Polysubstance abuse 10/2014   Ectasy use  . S/P cesarean section 05/25/2012  . S/p supracervical hysterectomy and bilateral salpingectomy on 09/11/15 09/11/2015  . Seizures [facility])    age [age]- after fell and hit head  . Smoker     Patient Active Problem List   Diagnosis Date Noted  . PTSD (post-traumatic stress disorder) 07/09/2017  . Bipolar disorder with moderate depression (HCC) 07/09/2017  . Cocaine use disorder, mild, abuse 07/09/2017  . Cannabis use disorder, mild, abuse 07/09/2017  . Prolonged Q-T interval on ECG 07/09/2017  . Overdose of antipsychotic, intentional self-harm, sequela  (HCC) 07/02/2017  . Polysubstance abuse 07/02/2017  . Somnolence 07/02/2017  . Uterine rupture during labor 09/11/2015  . S/p supracervical hysterectomy and bilateral salpingectomy on 09/11/15 09/11/2015  . Delayed postpartum hemorrhage x 3 episodes 08/16/2015  . S/P emergency cesarean section for uterine rupture on 08/11/15 08/11/2015  . 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 History of suicidal ideation 03/15/2015  . Intermittent palpitations 03/14/2015  . Syncopal episodes 03/09/2015    Past Surgical History:  Procedure Laterality Date  . ABDOMINAL HYSTERECTOMY N/A 09/11/2015   Procedure: SUPRACERVICAL ABDONIMAL HYSTERECTOMY, ;  Surgeon: Tereso Newcomer, MD;  Location: [facility] ORS;  Service: Gynecology;  Laterality: N/A;  . BILATERAL SALPINGECTOMY  09/11/2015   Procedure: BILATERAL SALPINGECTOMY;  Surgeon: Tereso Newcomer, MD;  Location: [facility] ORS;  Service: Gynecology;;  . CESAREAN SECTION  05/25/2012   Procedure: CESAREAN SECTION;  Surgeon: Sherron Monday, MD;  Location: [facility] ORS;  Service: Gynecology;  Laterality: N/A;  Primary cesarean section with delivery of baby girl at 0700. Apgars9/9.  Marland Kitchen CESAREAN SECTION N/A 08/11/2015   Procedure: CESAREAN SECTION;  Surgeon: Lazaro Arms, MD;  Location: [facility] ORS;  Service: Obstetrics;  Laterality: N/A;  . ORIF ORBITAL FRACTURE Left 02/25/2015   Procedure: OPEN REDUCTION INTERNAL FIXATION (ORIF) LEFT ORBITAL FRACTURE;  Surgeon: Melvenia Beam, MD;  Location: [facility] OR;  Service: ENT;  Laterality: Left;  . TONSILLECTOMY  OB History    Gravida Para Term Preterm AB Living   0 2 3   SAB TAB Ectopic Multiple Live Births   2 0 0 0 3       Home Medications    Prior to Admission medications   Medication Sig Start Date End Date Taking? Authorizing Provider  amLODipine (NORVASC) 10 MG tablet Take 1 tablet (10 mg total) by mouth daily. For high blood pressure 07/13/17  Yes Money, Gerlene Burdock, FNP  divalproex (DEPAKOTE ER) 250 MG  24 hr tablet Take 3 tablets (750 mg total) by mouth at bedtime. For mood control 07/12/17  Yes Money, Gerlene Burdock, FNP  hydrochlorothiazide (MICROZIDE) 12.5 MG capsule Take 1 capsule (12.5 mg total) by mouth daily. For high blood pressure 07/13/17  Yes Money, Gerlene Burdock, FNP  ibuprofen (ADVIL,MOTRIN) 200 MG tablet Take 200-400 mg by mouth every 6 (six) hours as needed for fever, headache, mild pain, moderate pain or cramping.   Yes [provider]  metroNIDAZOLE (FLAGYL) 500 MG tablet Take 1 tablet (500 mg total) by mouth every 12 (twelve) hours. Patient taking differently: Take 500 mg by mouth every 12 (twelve) hours. Started 09/21 for 5 days 07/12/17  Yes Money, Gerlene Burdock, FNP  guaiFENesin (MUCINEX) 600 MG 12 hr tablet Take 1 tablet (600 mg total) by mouth 2 (two) times daily. For cough Patient not taking: Reported on 07/17/2017 07/12/17   Money, Gerlene Burdock, FNP  naproxen (NAPROSYN) 500 MG tablet Take 1 tablet (500 mg total) by mouth 2 (two) times daily with a meal. 07/17/17 07/24/17  Shaune Pollack, MD  traMADol (ULTRAM) 50 MG tablet Take 1 tablet (50 mg total) by mouth every 6 (six) hours as needed for severe pain. 07/17/17   Shaune Pollack, MD    Family History Family History  Problem Relation Age of Onset  . Cancer Maternal Grandmother   . Hypertension Maternal Grandmother   . Hypertension Mother   . Anesthesia problems Neg Hx     Social History Social History  Substance Use Topics  . Smoking status: Current Every Day Smoker    Packs/day: 0.25    Years: 10.00    Types: Cigarettes  . Smokeless tobacco: Never Used  . Alcohol use 0.0 oz/week     Comment: "I had one drink socially"      Allergies   Patient has no known allergies.   Review of Systems Review of Systems  Constitutional: Positive for fatigue and unexpected weight change. Negative for fever.  Gastrointestinal: Positive for abdominal pain and nausea.  Genitourinary: Positive for pelvic pain.  All other systems  reviewed and are negative.    Physical Exam Updated Vital Signs BP (!) 139/108 (BP Location: Left Arm)   Pulse 90   Temp 98.6 F (37 C) (Oral)   Resp 18   Ht  (1.753 m)   Wt 88.5 kg (195 lb)   LMP 10/22/2014 (Approximate)   SpO2 99%   BMI 28.80 kg/m   Physical Exam  Constitutional: She is oriented to person, place, and time. She appears well-developed and well-nourished. No distress.  HENT:  Head: Normocephalic and atraumatic.  Eyes: Conjunctivae are normal.  Neck: Neck supple.  Cardiovascular: Normal rate, regular rhythm and normal heart sounds.  Exam reveals no friction rub.   No murmur heard. Pulmonary/Chest: Effort normal and breath sounds normal. No respiratory distress. She has no wheezes. She has no rales.  Abdominal: Soft. Bowel sounds are normal. She exhibits  no distension. There is tenderness (Moderate, left lower quadrant and adnexal). There is no rebound and no guarding.  Musculoskeletal: She exhibits no edema.  Neurological: She is alert and oriented to person, place, and time. She exhibits normal muscle tone.  Skin: Skin is warm. Capillary refill takes less than 2 seconds.  Psychiatric: She has a normal mood and affect.  Nursing note and vitals reviewed.    ED Treatments / Results  Labs (all labs ordered are listed, but only abnormal results are displayed) Labs Reviewed  CBC - Abnormal; Notable for the following:       Result Value   RBC 3.84 (*)    Hemoglobin 11.7 (*)    HCT 35.3 (*)    All other components within normal limits  URINALYSIS, ROUTINE W REFLEX MICROSCOPIC - Abnormal; Notable for the following:    Color, Urine STRAW (*)    All other components within normal limits  LIPASE, BLOOD  COMPREHENSIVE METABOLIC PANEL  POC URINE PREG, ED    EKG  EKG Interpretation None       Radiology US Transvaginal Non-ob  Result Date: 07/17/2017 CLINICAL DATA:  Left lower quadrant abdomen pain for 1 day. EXAM: TRANSABDOMINAL ULTRASOUND OF  PELVIS DOPPLER ULTRASOUND OF OVARIES TECHNIQUE: Transabdominal ultrasound examination of the pelvis was performed including evaluation of the uterus, ovaries, adnexal regions, and pelvic cul-de-sac. Color and duplex Doppler ultrasound was utilized to evaluate blood flow to the ovaries. COMPARISON:  CT abdomen pelvis November 17, 2016 FINDINGS: Uterus Status post prior hysterectomy. Endometrium Status post prior hysterectomy. Right ovary Measurements: 3 x 2.3 x 1.7 cm. Normal appearance/no adnexal mass. Left ovary Measurements: 6.3 x 4.6 x 3.8 cm. There is a 4.9 x 4 x 3.1 cm heterogeneous complex echotexture mass in the left adnexa. Pulsed Doppler evaluation demonstrates normal low-resistance arterial and venous waveforms in both ovaries. IMPRESSION: 4.9 cm heterogeneous complex echotexture mass in the left adnexa. Solid neoplasm is not excluded. This mass is new compared to prior CT of November 17, 2016. Electronically Signed   By: Sherian Rein M.D.   On: 07/17/2017 08:57   US Pelvis Complete  Result Date: 07/17/2017 CLINICAL DATA:  Left lower quadrant abdomen pain for 1 day. EXAM: TRANSABDOMINAL ULTRASOUND OF PELVIS DOPPLER ULTRASOUND OF OVARIES TECHNIQUE: Transabdominal ultrasound examination of the pelvis was performed including evaluation of the uterus, ovaries, adnexal regions, and pelvic cul-de-sac. Color and duplex Doppler ultrasound was utilized to evaluate blood flow to the ovaries. COMPARISON:  CT abdomen pelvis November 17, 2016 FINDINGS: Uterus Status post prior hysterectomy. Endometrium Status post prior hysterectomy. Right ovary Measurements: 3 x 2.3 x 1.7 cm. Normal appearance/no adnexal mass. Left ovary Measurements: 6.3 x 4.6 x 3.8 cm. There is a 4.9 x 4 x 3.1 cm heterogeneous complex echotexture mass in the left adnexa. Pulsed Doppler evaluation demonstrates normal low-resistance arterial and venous waveforms in both ovaries. IMPRESSION: 4.9 cm heterogeneous complex echotexture mass in the left  adnexa. Solid neoplasm is not excluded. This mass is new compared to prior CT of November 17, 2016. Electronically Signed   By: Sherian Rein M.D.   On: 07/17/2017 08:57   [place] Art/ven Flow Abd Pelv Doppler  Result Date: 07/17/2017 CLINICAL DATA:  Left lower quadrant abdomen pain for 1 day. EXAM: TRANSABDOMINAL ULTRASOUND OF PELVIS DOPPLER ULTRASOUND OF OVARIES TECHNIQUE: Transabdominal ultrasound examination of the pelvis was performed including evaluation of the uterus, ovaries, adnexal regions, and pelvic cul-de-sac. Color and duplex Doppler ultrasound was utilized to evaluate  blood flow to the ovaries. COMPARISON:  CT abdomen pelvis November 17, 2016 FINDINGS: Uterus Status post prior hysterectomy. Endometrium Status post prior hysterectomy. Right ovary Measurements: 3 x 2.3 x 1.7 cm. Normal appearance/no adnexal mass. Left ovary Measurements: 6.3 x 4.6 x 3.8 cm. There is a 4.9 x 4 x 3.1 cm heterogeneous complex echotexture mass in the left adnexa. Pulsed Doppler evaluation demonstrates normal low-resistance arterial and venous waveforms in both ovaries. IMPRESSION: 4.9 cm heterogeneous complex echotexture mass in the left adnexa. Solid neoplasm is not excluded. This mass is new compared to prior CT of November 17, 2016. Electronically Signed   By: Sherian Rein M.D.   On: 07/17/2017 08:57    Procedures Procedures (including critical care time)  Medications Ordered in ED Medications  morphine 4 MG/ML injection 4 mg (4 mg Intravenous Given 07/17/17 0754)  ketorolac (TORADOL) 15 MG/ML injection 15 mg (15 mg Intravenous Given 07/17/17 0754)  sodium chloride 0.9 % bolus 1,000 mL (0 mLs Intravenous Stopped 07/17/17 0857)  morphine 4 MG/ML injection 4 mg (4 mg Intravenous Given 07/17/17 0900)  oxyCODONE-acetaminophen (PERCOCET/ROXICET) 5-325 MG per tablet 2 tablet (2 tablets Oral Given 07/17/17 1025)     Initial Impression / Assessment and Plan / ED Course  I have reviewed the triage vital signs and the  nursing notes.  Pertinent labs & imaging results that were available during my care of the patient were reviewed by me and considered in my medical decision making (see chart for details).     26 year old female here with left adnexal pain and abdominal pain. Patient has history of ovarian cyst. Labwork is overall reassuring and patient is hemodynamically stable. Transvaginal ultrasound is concerning for new, 4.9 cm mass in the left adnexa. This is likely a complex cyst but I'm concerned about possible underlying malignancy. I discussed the case with Dr. Alvester Morin of OB/GYN. I will refer her for an urgent GYN on appointment, give pain control and antiemetics, and refer for close outpatient follow-up.  Of note, patient does have significant pain related to this new mass. She is requesting narcotics. I reviewed the patient's record, and she does have a history of substance abuse. I am hesitant to give her any strong narcotics. She has a reported history of long QT, but this was in the setting of Seroquel use and overdose, and her QT was normalized on most recent EKG. Will give her a very short course of tramadol. She has no history of seizures. Otherwise, will give naproxen and advised strict outpatient follow-up.  Final Clinical Impressions(s) / ED Diagnoses   Final diagnoses:  Adnexal mass    New Prescriptions Discharge Medication List as of 07/17/2017 10:14 AM    START taking these medications   Details  naproxen (NAPROSYN) 500 MG tablet Take 1 tablet (500 mg total) by mouth 2 (two) times daily with a meal., Starting Wed 07/17/2017, Until Wed 07/24/2017, Print    traMADol (ULTRAM) 50 MG tablet Take 1 tablet (50 mg total) by mouth every 6 (six) hours as needed for severe pain., Starting Wed 07/17/2017, Print         Shaune Pollack, MD 07/17/17 2250

## 2017-07-17 NOTE — ED Triage Notes (Signed)
Patient is from home and transported via [facility]. Patient is experiencing left lower quad pain that started yesterday. Patient relates this pain has a ruptured cyst. Also, complaining of a sore throat. Patient is ambulatory and resting quietly with triaging. Per EMS, patient was upset that EMS was not starting an IV to give her pain medication. Per EMS, patient recently at [facility] for a drug overdose. While triaging, pt is convinced she will need surgery.

## 2017-07-17 NOTE — ED Notes (Signed)
Pt reports that after ultrasound, she is having increased pain. MD made aware

## 2017-07-17 NOTE — ED Notes (Signed)
Pt requesting a wheelchair to take home. Pt informed we do not give wheelchairs to take home, but the MD put in a consult for case management to follow up with her to assist her in making sure she is able to get her follow ups scheduled.  Pt also informed of the reason she was not given narcotics for pain management.

## 2017-07-17 NOTE — ED Notes (Signed)
[phone] for pt follow up with case management

## 2017-07-17 NOTE — ED Notes (Signed)
Ultrasound at bedside

## 2017-07-17 NOTE — Telephone Encounter (Signed)
Attempted to follow up with pt by phone for CM consult for no ins no pcp.  [phone] hung up after several rings.  [phone] which pt left with the primary RN for CM to contact her at went to VM.  [facility] a [facility] for an appointment or that she could call the [facility] for an appointment.  No further CM needs noted at this time.

## 2017-07-18 ENCOUNTER — Telehealth: Payer: Self-pay

## 2017-07-18 ENCOUNTER — Encounter: Payer: Self-pay | Admitting: Gynecologic Oncology

## 2017-07-18 ENCOUNTER — Ambulatory Visit: Payer: Self-pay | Attending: Gynecologic Oncology | Admitting: Gynecologic Oncology

## 2017-07-18 ENCOUNTER — Other Ambulatory Visit: Payer: Self-pay

## 2017-07-18 VITALS — BP 143/95 | HR 77 | Temp 98.7°F | Resp 20 | Ht 69.0 in | Wt 195.5 lb

## 2017-07-18 DIAGNOSIS — R1900 Intra-abdominal and pelvic swelling, mass and lump, unspecified site: Secondary | ICD-10-CM

## 2017-07-18 DIAGNOSIS — F419 Anxiety disorder, unspecified: Secondary | ICD-10-CM | POA: Insufficient documentation

## 2017-07-18 DIAGNOSIS — Z79899 Other long term (current) drug therapy: Secondary | ICD-10-CM | POA: Insufficient documentation

## 2017-07-18 DIAGNOSIS — R102 Pelvic and perineal pain: Secondary | ICD-10-CM | POA: Insufficient documentation

## 2017-07-18 DIAGNOSIS — N858 Other specified noninflammatory disorders of uterus: Secondary | ICD-10-CM | POA: Insufficient documentation

## 2017-07-18 DIAGNOSIS — F319 Bipolar disorder, unspecified: Secondary | ICD-10-CM | POA: Insufficient documentation

## 2017-07-18 DIAGNOSIS — N83201 Unspecified ovarian cyst, right side: Secondary | ICD-10-CM | POA: Insufficient documentation

## 2017-07-18 DIAGNOSIS — M25559 Pain in unspecified hip: Secondary | ICD-10-CM | POA: Insufficient documentation

## 2017-07-18 DIAGNOSIS — F17210 Nicotine dependence, cigarettes, uncomplicated: Secondary | ICD-10-CM | POA: Insufficient documentation

## 2017-07-18 DIAGNOSIS — Z90710 Acquired absence of both cervix and uterus: Secondary | ICD-10-CM | POA: Insufficient documentation

## 2017-07-18 DIAGNOSIS — G8929 Other chronic pain: Secondary | ICD-10-CM

## 2017-07-18 DIAGNOSIS — I10 Essential (primary) hypertension: Secondary | ICD-10-CM | POA: Insufficient documentation

## 2017-07-18 NOTE — Telephone Encounter (Signed)
Message received from Eldridge Abrahams, RN CM noting that the patient can call [facility] for a follow up appointment. No hospital follow up appointments available at [facility] at this time.   This information was shared with A. Kritzer, RN CM

## 2017-07-18 NOTE — Patient Instructions (Addendum)
Plan to have an ultrasound on August 27, 2017 to further evaluate the ovarian cyst. Arrive with a full bladder. Also, keep track of your symptoms in a diary so that can be assessed.  You can take ibuprofen that will with the pain and inflammation.
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
## 2017-07-23 ENCOUNTER — Telehealth: Payer: Self-pay

## 2017-07-23 NOTE — Telephone Encounter (Signed)
Message received from Eldridge Abrahams, RN CM requesting a hospital follow up appointment for the patient. There were no appointments available at the time the patient was in the ED.  An appointment is available tomorrow. Call placed to # [phone] (H) and the call was dropped after 8 rings. No option to leave a message.  A call was also placed to # [phone] (M) and a HIPAA compliant voicemail message was left requesting a call back to # [phone].

## 2017-08-02 ENCOUNTER — Encounter (HOSPITAL_COMMUNITY): Payer: Self-pay | Admitting: *Deleted

## 2017-08-02 DIAGNOSIS — Z79899 Other long term (current) drug therapy: Secondary | ICD-10-CM | POA: Insufficient documentation

## 2017-08-02 DIAGNOSIS — R1030 Lower abdominal pain, unspecified: Secondary | ICD-10-CM | POA: Insufficient documentation

## 2017-08-02 DIAGNOSIS — I10 Essential (primary) hypertension: Secondary | ICD-10-CM | POA: Insufficient documentation

## 2017-08-02 DIAGNOSIS — F17210 Nicotine dependence, cigarettes, uncomplicated: Secondary | ICD-10-CM | POA: Insufficient documentation

## 2017-08-02 LAB — COMPREHENSIVE METABOLIC PANEL
ALBUMIN: 4.5 g/dL (ref 3.5–5.0)
ALT: 12 U/L — ABNORMAL LOW (ref 14–54)
AST: 18 U/L (ref 15–41)
Alkaline Phosphatase: 47 U/L (ref 38–126)
Anion gap: 9 (ref 5–15)
BUN: 18 mg/dL (ref 6–20)
CHLORIDE: 104 mmol/L (ref 101–111)
CO2: 26 mmol/L (ref 22–32)
Calcium: 9.5 mg/dL (ref 8.9–10.3)
Creatinine, Ser: 0.81 mg/dL (ref 0.44–1.00)
GFR calc Af Amer: >60 mL/min (ref >60)
Glucose, Bld: 85 mg/dL (ref 65–99)
POTASSIUM: 4 mmol/L (ref 3.5–5.1)
Sodium: 139 mmol/L (ref 135–145)
Total Bilirubin: 0.4 mg/dL (ref 0.3–1.2)
Total Protein: 7.7 g/dL (ref 6.5–8.1)

## 2017-08-02 LAB — CBC
HEMATOCRIT: 37.8 % (ref 36.0–46.0)
HEMOGLOBIN: 12.4 g/dL (ref 12.0–15.0)
MCH: 30.7 pg (ref 26.0–34.0)
MCHC: 32.8 g/dL (ref 30.0–36.0)
MCV: 93.6 fL (ref 78.0–100.0)
Platelets: 257 10*3/uL (ref 150–400)
RBC: 4.04 MIL/uL (ref 3.87–5.11)
RDW: 13 % (ref 11.5–15.5)
WBC: 7.3 10*3/uL (ref 4.0–10.5)

## 2017-08-02 LAB — LIPASE, BLOOD: LIPASE: 30 U/L (ref 11–51)

## 2017-08-02 LAB — I-STAT TROPONIN, ED: Troponin i, poc: 0 ng/mL (ref 0.00–0.08)

## 2017-08-02 NOTE — ED Triage Notes (Signed)
Pt arrives from [facility] via EMS.Per report by EMS, c/o midline abdominal pain that has been going on for about a week. Endorses similar to ovarian cyst pain she has had recently. Also had dull chest pain over the past week. En route, 140/90, hr 90, resp 18, 99 RA, cbg 85. No meds PTA for the same.

## 2017-08-03 ENCOUNTER — Emergency Department (HOSPITAL_COMMUNITY)
Admission: EM | Admit: 2017-08-03 | Discharge: 2017-08-03 | Disposition: A | Discharge status: Home / Self Care | Payer: Self-pay | Attending: Emergency Medicine | Admitting: Emergency Medicine

## 2017-08-03 DIAGNOSIS — R1030 Lower abdominal pain, unspecified: Secondary | ICD-10-CM

## 2017-08-03 LAB — URINALYSIS, ROUTINE W REFLEX MICROSCOPIC
BILIRUBIN URINE: NEGATIVE
Bacteria, UA: NONE SEEN
Glucose, UA: NEGATIVE mg/dL
HGB URINE DIPSTICK: NEGATIVE
Ketones, ur: NEGATIVE mg/dL
NITRITE: NEGATIVE
PH: 5 (ref 5.0–8.0)
Protein, ur: NEGATIVE mg/dL
RBC / HPF: NONE SEEN RBC/hpf (ref 0–5)
SQUAMOUS EPITHELIAL / LPF: NONE SEEN
Specific Gravity, Urine: 1.03 (ref 1.005–1.030)

## 2017-08-03 MED ORDER — METHOCARBAMOL 500 MG PO TABS: 500.0000 mg | ORAL_TABLET | Freq: Two times a day (BID) | ORAL | 0 refills | Status: DC

## 2017-08-03 MED ORDER — METHOCARBAMOL 500 MG PO TABS: 500.0000 mg | ORAL_TABLET | Freq: Once | ORAL | Status: DC

## 2017-08-03 NOTE — ED Notes (Signed)
Patient is alert and oriented x3. Patient requested for sleeping arrangements and states that the lobby chairs were too hard to sleep on.   She was given DC instructions and follow up visit instructions and verbally called me an asshole due to the fact of I could not provide her a place to sleep.  Patient then requested to see a social work and none were available till morning. She was DC ambulatory under her own power to lobby.  V/S stable.  He was not showing any signs of distress on DC

## 2017-08-03 NOTE — ED Provider Notes (Signed)
[facility]-EMERGENCY DEPT Provider Note   CSN: [id number] Arrival date & time: 08/02/17  2046     History   Chief Complaint Chief Complaint  Patient presents with  . Abdominal Pain    HPI Stacy Moss is a 26 y.o. female.  26 year old female presents with one-month history of right lower quadrant abdominal pain. Was diagnosed with a cyst 2 weeks ago and follow-up with gynecology oncology for possible neoplastic process. That note was reviewed in the plant this time is for her to have repeat ultrasound in 6 weeks. Patient states that she is unhappy with this and have requested to have a cyst removed which the surgeon is declining at this time. States that her symptoms are unchanged and not associated with vaginal bleeding or discharge. Denies any urinary symptoms. She is status post hysterectomy. Tonight symptoms became worse and radiated to her chest but without anginal features. EMS was called and patient transported here      Past Medical History:  Diagnosis Date  . ADD (attention deficit disorder)   . Anxiety   . Bipolar affective (HCC)   . Depression   . History of cocaine use 10/2014  . History of gestational diabetes mellitus (GDM)    with prior pregnancy  . History of marijuana use   . History of suicidal ideation   . History of VBAC   . Hx of eye surgery 02/2015  . Hx of tonsillectomy   . Hypertension    on meds 2011  . Mental disorder   . Polysubstance abuse (HCC) 10/2014   Ectasy use  . S/P cesarean section 05/25/2012  . S/p supracervical hysterectomy and bilateral salpingectomy on 09/11/15 09/11/2015  . Seizures [facility])    age [age]- after fell and hit head  . Smoker     Patient Active Problem List   Diagnosis Date Noted  . Pain in joint, pelvic region and thigh 07/18/2017  . Chronic female pelvic pain 07/18/2017  . PTSD (post-traumatic stress disorder) 07/09/2017  . Bipolar disorder with moderate depression (HCC) 07/09/2017  . Cocaine use disorder, mild,  abuse (HCC) 07/09/2017  . Cannabis use disorder, mild, abuse 07/09/2017  . Prolonged Q-T interval on ECG 07/09/2017  . Overdose of antipsychotic, intentional self-harm, sequela (HCC) 07/02/2017  . Polysubstance abuse (HCC) 07/02/2017  . Somnolence 07/02/2017  . Uterine rupture during labor 09/11/2015  . S/p supracervical hysterectomy and bilateral salpingectomy on 09/11/15 09/11/2015  . Delayed postpartum hemorrhage x 3 episodes 08/16/2015  . S/P emergency cesarean section for uterine rupture on 08/11/15 08/11/2015  . 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 History of suicidal ideation 03/15/2015  . Intermittent palpitations 03/14/2015  . Syncopal episodes 03/09/2015    Past Surgical History:  Procedure Laterality Date  . ABDOMINAL HYSTERECTOMY N/A 09/11/2015   Procedure: SUPRACERVICAL ABDONIMAL HYSTERECTOMY, ;  Surgeon: Tereso Newcomer, MD;  Location: [facility];  Service: Gynecology;  Laterality: N/A;  . BILATERAL SALPINGECTOMY  09/11/2015   Procedure: BILATERAL SALPINGECTOMY;  Surgeon: Tereso Newcomer, MD;  Location: [facility];  Service: Gynecology;;  . CESAREAN SECTION  05/25/2012   Procedure: CESAREAN SECTION;  Surgeon: Sherron Monday, MD;  Location: [facility];  Service: Gynecology;  Laterality: N/A;  Primary cesarean section with delivery of baby girl at 0700. Apgars9/9.  Marland Kitchen CESAREAN SECTION N/A 08/11/2015   Procedure: CESAREAN SECTION;  Surgeon: Lazaro Arms, MD;  Location: [facility];  Service: Obstetrics;  Laterality: N/A;  . ORIF ORBITAL FRACTURE Left 02/25/2015  Procedure: OPEN REDUCTION INTERNAL FIXATION (ORIF) LEFT ORBITAL FRACTURE;  Surgeon: Melvenia Beam, MD;  Location[facility];  Service: ENT;  Laterality: Left;  . TONSILLECTOMY      OB History    Gravida Para Term Preterm AB Living   0 2 3   SAB TAB Ectopic Multiple Live Births   2 0 0 0 3       Home Medications    Prior to Admission medications   Medication Sig Start Date End Date Taking?  Authorizing Provider  amLODipine (NORVASC) 10 MG tablet Take 1 tablet (10 mg total) by mouth daily. For high blood pressure 07/13/17  Yes Money, Gerlene Burdock, FNP  divalproex (DEPAKOTE ER) 250 MG 24 hr tablet Take 3 tablets (750 mg total) by mouth at bedtime. For mood control 07/12/17  Yes Money, Gerlene Burdock, FNP  hydrochlorothiazide (MICROZIDE) 12.5 MG capsule Take 1 capsule (12.5 mg total) by mouth daily. For high blood pressure 07/13/17  Yes Money, Gerlene Burdock, FNP  ibuprofen (ADVIL,MOTRIN) 200 MG tablet Take 800 mg by mouth every 6 (six) hours as needed for fever, headache, mild pain, moderate pain or cramping.    Yes [provider]  traMADol (ULTRAM) 50 MG tablet Take 1 tablet (50 mg total) by mouth every 6 (six) hours as needed for severe pain. 07/17/17  Yes Shaune Pollack, MD  guaiFENesin (MUCINEX) 600 MG 12 hr tablet Take 1 tablet (600 mg total) by mouth 2 (two) times daily. For cough Patient not taking: Reported on 07/17/2017 07/12/17   Money, Gerlene Burdock, FNP  metroNIDAZOLE (FLAGYL) 500 MG tablet Take 1 tablet (500 mg total) by mouth every 12 (twelve) hours. Patient not taking: Reported on 08/03/2017 07/12/17   Money, Gerlene Burdock, FNP    Family History Family History  Problem Relation Age of Onset  . Cancer Maternal Grandmother   . Hypertension Maternal Grandmother   . Hypertension Mother   . Anesthesia problems Neg Hx     Social History Social History  Substance Use Topics  . Smoking status: Current Every Day Smoker    Packs/day: 0.25    Years: 10.00    Types: Cigarettes  . Smokeless tobacco: Never Used  . Alcohol use 0.0 oz/week     Comment: "I had one drink socially"      Allergies   Patient has no known allergies.   Review of Systems Review of Systems  All other systems reviewed and are negative.    Physical Exam Updated Vital Signs BP 106/87 (BP Location: Left Arm)   Pulse 77   Temp 97.8 F (36.6 C) (Oral)   Resp 13   Ht 1.753 m ( )   Wt 88.5 kg (195 lb)    LMP 10/22/2014 (Approximate)   SpO2 100%   BMI 28.80 kg/m   Physical Exam  Constitutional: She is oriented to person, place, and time. She appears well-developed and well-nourished.  Non-toxic appearance. No distress.  HENT:  Head: Normocephalic and atraumatic.  Eyes: Pupils are equal, round, and reactive to light. Conjunctivae, EOM and lids are normal.  Neck: Normal range of motion. Neck supple. No tracheal deviation present. No thyroid mass present.  Cardiovascular: Normal rate, regular rhythm and normal heart sounds.  Exam reveals no gallop.   No murmur heard. Pulmonary/Chest: Effort normal and breath sounds normal. No stridor. No respiratory distress. She has no decreased breath sounds. She has no wheezes. She has no rhonchi. She has no rales.  Abdominal: Soft. Normal  appearance and bowel sounds are normal. She exhibits no distension. There is no tenderness. There is no rigidity, no rebound, no guarding and no CVA tenderness.  Musculoskeletal: Normal range of motion. She exhibits no edema or tenderness.  Neurological: She is alert and oriented to person, place, and time. She has normal strength. No cranial nerve deficit or sensory deficit. GCS eye subscore is 4. GCS verbal subscore is 5. GCS motor subscore is 6.  Skin: Skin is warm and dry. No abrasion and no rash noted.  Psychiatric: She has a normal mood and affect. Her speech is normal and behavior is normal.  Nursing note and vitals reviewed.    ED Treatments / Results  Labs (all labs ordered are listed, but only abnormal results are displayed) Labs Reviewed  COMPREHENSIVE METABOLIC PANEL - Abnormal; Notable for the following:       Result Value   ALT 12 (*)    All other components within normal limits  LIPASE, BLOOD  CBC  URINALYSIS, ROUTINE W REFLEX MICROSCOPIC  I-STAT TROPONIN, ED    EKG  EKG Interpretation None       Radiology No results found.  Procedures Procedures (including critical care  time)  Medications Ordered in ED Medications - No data to display   Initial Impression / Assessment and Plan / ED Course  I have reviewed the triage vital signs and the nursing notes.  Pertinent labs & imaging results that were available during my care of the patient were reviewed by me and considered in my medical decision making (see chart for details).     Patient given Robaxin for her discomfort. Labs are reviewed and show no acute abnormalities.. Abdominal exam is benign. Patient instructed to follow-up with gynecology  Final Clinical Impressions(s) / ED Diagnoses   Final diagnoses:  None    New Prescriptions New Prescriptions   No medications on file     Lorre Nick, MD 08/03/17 0405

## 2017-08-27 ENCOUNTER — Ambulatory Visit (HOSPITAL_COMMUNITY): Payer: Self-pay

## 2017-11-02 ENCOUNTER — Emergency Department (HOSPITAL_COMMUNITY)
Admission: EM | Admit: 2017-11-02 | Discharge: 2017-11-02 | Disposition: A | Discharge status: Home / Self Care | Payer: Self-pay | Attending: Emergency Medicine | Admitting: Emergency Medicine

## 2017-11-02 ENCOUNTER — Other Ambulatory Visit: Payer: Self-pay

## 2017-11-02 ENCOUNTER — Encounter (HOSPITAL_COMMUNITY): Payer: Self-pay | Admitting: Emergency Medicine

## 2017-11-02 ENCOUNTER — Emergency Department (HOSPITAL_COMMUNITY): Payer: Self-pay

## 2017-11-02 DIAGNOSIS — N760 Acute vaginitis: Secondary | ICD-10-CM | POA: Insufficient documentation

## 2017-11-02 DIAGNOSIS — F17210 Nicotine dependence, cigarettes, uncomplicated: Secondary | ICD-10-CM | POA: Insufficient documentation

## 2017-11-02 DIAGNOSIS — Z79899 Other long term (current) drug therapy: Secondary | ICD-10-CM | POA: Insufficient documentation

## 2017-11-02 DIAGNOSIS — R102 Pelvic and perineal pain: Secondary | ICD-10-CM | POA: Insufficient documentation

## 2017-11-02 DIAGNOSIS — K5900 Constipation, unspecified: Secondary | ICD-10-CM | POA: Insufficient documentation

## 2017-11-02 DIAGNOSIS — N390 Urinary tract infection, site not specified: Secondary | ICD-10-CM | POA: Insufficient documentation

## 2017-11-02 DIAGNOSIS — B9689 Other specified bacterial agents as the cause of diseases classified elsewhere: Secondary | ICD-10-CM | POA: Insufficient documentation

## 2017-11-02 LAB — URINALYSIS, ROUTINE W REFLEX MICROSCOPIC
BILIRUBIN URINE: NEGATIVE
GLUCOSE, UA: NEGATIVE mg/dL
HGB URINE DIPSTICK: NEGATIVE
Ketones, ur: NEGATIVE mg/dL
NITRITE: NEGATIVE
PH: 5 (ref 5.0–8.0)
Protein, ur: NEGATIVE mg/dL
SPECIFIC GRAVITY, URINE: 1.02 (ref 1.005–1.030)

## 2017-11-02 LAB — COMPREHENSIVE METABOLIC PANEL
ALBUMIN: 3.2 g/dL — AB (ref 3.5–5.0)
ALK PHOS: 40 U/L (ref 38–126)
ALT: 11 U/L — ABNORMAL LOW (ref 14–54)
ANION GAP: 6 (ref 5–15)
AST: 17 U/L (ref 15–41)
BUN: 15 mg/dL (ref 6–20)
CALCIUM: 8.9 mg/dL (ref 8.9–10.3)
CO2: 25 mmol/L (ref 22–32)
Chloride: 108 mmol/L (ref 101–111)
Creatinine, Ser: 0.69 mg/dL (ref 0.44–1.00)
GFR calc Af Amer: >60 mL/min (ref >60)
GFR calc non Af Amer: >60 mL/min (ref >60)
GLUCOSE: 99 mg/dL (ref 65–99)
POTASSIUM: 3.7 mmol/L (ref 3.5–5.1)
SODIUM: 139 mmol/L (ref 135–145)
Total Bilirubin: 0.6 mg/dL (ref 0.3–1.2)
Total Protein: 5.6 g/dL — ABNORMAL LOW (ref 6.5–8.1)

## 2017-11-02 LAB — CBC WITH DIFFERENTIAL/PLATELET
BASOS ABS: 0 10*3/uL (ref 0.0–0.1)
BASOS PCT: 0 %
EOS ABS: 0.2 10*3/uL (ref 0.0–0.7)
Eosinophils Relative: 2 %
HCT: 36.1 % (ref 36.0–46.0)
HEMOGLOBIN: 11.5 g/dL — AB (ref 12.0–15.0)
Lymphocytes Relative: 43 %
Lymphs Abs: 3.5 10*3/uL (ref 0.7–4.0)
MCH: 29.9 pg (ref 26.0–34.0)
MCHC: 31.9 g/dL (ref 30.0–36.0)
MCV: 93.8 fL (ref 78.0–100.0)
MONOS PCT: 8 %
Monocytes Absolute: 0.6 10*3/uL (ref 0.1–1.0)
NEUTROS PCT: 47 %
Neutro Abs: 3.9 10*3/uL (ref 1.7–7.7)
Platelets: 262 10*3/uL (ref 150–400)
RBC: 3.85 MIL/uL — ABNORMAL LOW (ref 3.87–5.11)
RDW: 12.6 % (ref 11.5–15.5)
WBC: 8.2 10*3/uL (ref 4.0–10.5)

## 2017-11-02 LAB — WET PREP, GENITAL
SPERM: NONE SEEN
Trich, Wet Prep: NONE SEEN
Yeast Wet Prep HPF POC: NONE SEEN

## 2017-11-02 LAB — POC URINE PREG, ED: PREG TEST UR: NEGATIVE

## 2017-11-02 MED ORDER — KETOROLAC TROMETHAMINE 30 MG/ML IJ SOLN
30.0000 mg | Freq: Once | INTRAMUSCULAR | Status: AC
  Administered 2017-11-02: 30 mg via INTRAVENOUS
  Filled 2017-11-02: qty 1

## 2017-11-02 MED ORDER — IOPAMIDOL (ISOVUE-300) INJECTION 61%
100.0000 mL | Freq: Once | INTRAVENOUS | Status: AC | PRN
  Administered 2017-11-02: 100 mL via INTRAVENOUS

## 2017-11-02 MED ORDER — FOSFOMYCIN TROMETHAMINE 3 G PO PACK
3.0000 g | PACK | Freq: Once | ORAL | Status: AC
  Administered 2017-11-02: 3 g via ORAL
  Filled 2017-11-02: qty 3

## 2017-11-02 MED ORDER — IOPAMIDOL (ISOVUE-300) INJECTION 61%
INTRAVENOUS | Status: AC
  Filled 2017-11-02: qty 100

## 2017-11-02 MED ORDER — CEFTRIAXONE SODIUM 250 MG IJ SOLR
250.0000 mg | Freq: Once | INTRAMUSCULAR | Status: AC
  Administered 2017-11-02: 250 mg via INTRAMUSCULAR
  Filled 2017-11-02: qty 250

## 2017-11-02 MED ORDER — LIDOCAINE HCL (PF) 1 % IJ SOLN
INTRAMUSCULAR | Status: AC
  Filled 2017-11-02: qty 5

## 2017-11-02 MED ORDER — POLYETHYLENE GLYCOL 3350 17 G PO PACK: 17.0000 g | PACK | Freq: Every day | ORAL | 0 refills | Status: DC

## 2017-11-02 MED ORDER — AZITHROMYCIN 250 MG PO TABS
1000.0000 mg | ORAL_TABLET | Freq: Once | ORAL | Status: AC
  Administered 2017-11-02: 1000 mg via ORAL
  Filled 2017-11-02: qty 4

## 2017-11-02 MED ORDER — METRONIDAZOLE 500 MG PO TABS: 500.0000 mg | ORAL_TABLET | Freq: Two times a day (BID) | ORAL | 0 refills | Status: DC

## 2017-11-02 MED ORDER — DICYCLOMINE HCL 10 MG/ML IM SOLN
20.0000 mg | Freq: Once | INTRAMUSCULAR | Status: AC
  Administered 2017-11-02: 20 mg via INTRAMUSCULAR
  Filled 2017-11-02: qty 2
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
## 2017-11-02 NOTE — ED Triage Notes (Signed)
Pt presents to ED after waking up this morning with groin and genital pain.  C/o increasing urination, burning with urination, and a white discharge.

## 2017-11-02 NOTE — ED Notes (Signed)
Pelvic by the edp  Specimens to the lab

## 2017-11-02 NOTE — ED Notes (Signed)
Pelvic cart at door 

## 2017-11-02 NOTE — ED Notes (Signed)
To c-t the pt reports that the pain med given did not help her pain she weants something stronger

## 2017-11-02 NOTE — ED Provider Notes (Signed)
[facility] Provider Note   CSN: [id number] Arrival date & time: 11/02/17  1413     History   Chief Complaint Chief Complaint  Patient presents with  . Vaginal Pain    HPI Stacy Moss is a 27 y.o. female.  HPI Stacy Moss is a 27 y.o. female with history of anxiety, depression, bipolar disorder, hypertension, presents to emergency department complaining of abdominal pain.  Patient states that she has had abdominal pain for several months.  She was seen in September, diagnosed with a complex ovarian mass.  She has followed up with oncologist who instructed her to follow-up with OB/GYN for further evaluation and treatment.  Patient states she missed an appointment and has not been seen yet.  Patient states that the pain has gradually been getting worse.  She states that today became worse so she came for evaluation.  She also reports new sexual partner and reports some vaginal discharge that is new for her.  She denies any vaginal pain.  No vaginal bleeding.  History of hysterectomy.  No fever or chills.  No nausea or vomiting.  Last bowel movement was 3 days ago.  Past Medical History:  Diagnosis Date  . ADD (attention deficit disorder)   . Anxiety   . Bipolar affective (HCC)   . Depression   . History of cocaine use 10/2014  . History of gestational diabetes mellitus (GDM)    with prior pregnancy  . History of marijuana use   . History of suicidal ideation   . History of VBAC   . Hx of eye surgery 02/2015  . Hx of tonsillectomy   . Hypertension    on meds 2011  . Mental disorder   . Polysubstance abuse (HCC) 10/2014   Ectasy use  . S/P cesarean section 05/25/2012  . S/p supracervical hysterectomy and bilateral salpingectomy on 09/11/15 09/11/2015  . Seizures [facility])    age [age]- after fell and hit head  . Smoker     Patient Active Problem List   Diagnosis Date Noted  . Pain in joint, pelvic region and thigh 07/18/2017  .  Chronic female pelvic pain 07/18/2017  . PTSD (post-traumatic stress disorder) 07/09/2017  . Bipolar disorder with moderate depression (HCC) 07/09/2017  . Cocaine use disorder, mild, abuse (HCC) 07/09/2017  . Cannabis use disorder, mild, abuse 07/09/2017  . Prolonged Q-T interval on ECG 07/09/2017  . Overdose of antipsychotic, intentional self-harm, sequela (HCC) 07/02/2017  . Polysubstance abuse (HCC) 07/02/2017  . Somnolence 07/02/2017  . Uterine rupture during labor 09/11/2015  . S/p supracervical hysterectomy and bilateral salpingectomy on 09/11/15 09/11/2015  . Delayed postpartum hemorrhage x 3 episodes 08/16/2015  . S/P emergency cesarean section for uterine rupture on 08/11/15 08/11/2015  . 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 History of suicidal ideation 03/15/2015  . Intermittent palpitations 03/14/2015  . Syncopal episodes 03/09/2015    Past Surgical History:  Procedure Laterality Date  . ABDOMINAL HYSTERECTOMY N/A 09/11/2015   Procedure: SUPRACERVICAL ABDONIMAL HYSTERECTOMY, ;  Surgeon: Tereso NewcomerUgonna A Anyanwu, MD;  Location: [facility] OR[facility];  Service: Gynecology;  Laterality: N/A;  . BILATERAL SALPINGECTOMY  09/11/2015   Procedure: BILATERAL SALPINGECTOMY;  Surgeon: Tereso NewcomerUgonna A Anyanwu, MD;  Location: [facility];  Service: Gynecology;;  . CESAREAN SECTION  05/25/2012   Procedure: CESAREAN SECTION;  Surgeon: Sherron MondayJody Bovard, MD;  Location: [facility] ORS;  Service: Gynecology;  Laterality: N/A;  Primary cesarean section with delivery of baby girl  at 0700. Apgars9/9.  Marland Kitchen CESAREAN SECTION N/A 08/11/2015   Procedure: CESAREAN SECTION;  Surgeon: Lazaro Arms, MD;  Location: [facility];  Service: Obstetrics;  Laterality: N/A;  . ORIF ORBITAL FRACTURE Left 02/25/2015   Procedure: OPEN REDUCTION INTERNAL FIXATION (ORIF) LEFT ORBITAL FRACTURE;  Surgeon: Melvenia Beam, MD;  Location[facility] OR;  Service: ENT;  Laterality: Left;  . TONSILLECTOMY      OB History    Gravida Para Term Preterm AB  Living   5 3 3  0 2 3   SAB TAB Ectopic Multiple Live Births   2 0 0 0 3       Home Medications    Prior to Admission medications   Medication Sig Start Date End Date Taking? Authorizing Provider  amLODipine (NORVASC) 10 MG tablet Take 1 tablet (10 mg total) by mouth daily. For high blood pressure 07/13/17  Yes Money, Gerlene Burdock, FNP  divalproex (DEPAKOTE ER) 250 MG 24 hr tablet Take 3 tablets (750 mg total) by mouth at bedtime. For mood control 07/12/17  Yes Money, Gerlene Burdock, FNP  hydrochlorothiazide (MICROZIDE) 12.5 MG capsule Take 1 capsule (12.5 mg total) by mouth daily. For high blood pressure 07/13/17  Yes Money, Gerlene Burdock, FNP  methocarbamol (ROBAXIN) 500 MG tablet Take 1 tablet (500 mg total) by mouth 2 (two) times daily. Patient not taking: Reported on 11/02/2017 08/03/17   Lorre Nick, MD    Family History Family History  Problem Relation Age of Onset  . Cancer Maternal Grandmother   . Hypertension Maternal Grandmother   . Hypertension Mother   . Anesthesia problems Neg Hx     Social History Social History   Tobacco Use  . Smoking status: Current Every Day Smoker    Packs/day: 0.25    Years: 10.00    Pack years: 2.50    Types: Cigarettes  . Smokeless tobacco: Never Used  Substance Use Topics  . Alcohol use: Yes    Alcohol/week: 0.0 oz    Comment: "I had one drink socially"   . Drug use: Yes    Types: Marijuana    Comment: History of cocaine and ecstasy use.      Allergies   Patient has no known allergies.   Review of Systems Review of Systems  Constitutional: Negative for chills and fever.  Respiratory: Negative for cough, chest tightness and shortness of breath.   Cardiovascular: Negative for chest pain, palpitations and leg swelling.  Gastrointestinal: Positive for abdominal pain. Negative for diarrhea, nausea and vomiting.  Genitourinary: Positive for pelvic pain and vaginal discharge. Negative for dysuria, flank pain, vaginal bleeding and vaginal  pain.  Musculoskeletal: Negative for arthralgias, myalgias, neck pain and neck stiffness.  Skin: Negative for rash.  Neurological: Negative for dizziness, weakness and headaches.  All other systems reviewed and are negative.    Physical Exam Updated Vital Signs BP (!) 143/80   Pulse 100   Temp 98 F (36.7 C) (Oral)   Resp 18   LMP 10/22/2014 (Approximate)   SpO2 100%   Physical Exam  Constitutional: She is oriented to person, place, and time. She appears well-developed and well-nourished. No distress.  HENT:  Head: Normocephalic.  Eyes: Conjunctivae are normal.  Neck: Neck supple.  Cardiovascular: Normal rate, regular rhythm and normal heart sounds.  Pulmonary/Chest: Effort normal and breath sounds normal. No respiratory distress. She has no wheezes. She has no rales.  Abdominal: Soft. Bowel sounds are normal. She exhibits no distension. There is tenderness. There  is no rebound.  Diffuse lower abdominal and mid abdominal tenderness  Musculoskeletal: She exhibits no edema.  Neurological: She is alert and oriented to person, place, and time.  Skin: Skin is warm and dry.  Psychiatric: She has a normal mood and affect. Her behavior is normal.  Nursing note and vitals reviewed.    ED Treatments / Results  Labs (all labs ordered are listed, but only abnormal results are displayed) Labs Reviewed  URINALYSIS, ROUTINE W REFLEX MICROSCOPIC - Abnormal; Notable for the following components:      Result Value   Leukocytes, UA TRACE (*)    Bacteria, UA RARE (*)    Squamous Epithelial / LPF 0-5 (*)    All other components within normal limits  WET PREP, GENITAL  CBC WITH DIFFERENTIAL/PLATELET  COMPREHENSIVE METABOLIC PANEL  POC URINE PREG, ED  GC/CHLAMYDIA PROBE AMP ([place]) NOT AT [facility]    EKG  EKG Interpretation None       Radiology No results found.  Procedures Procedures (including critical care time)  Medications Ordered in ED Medications  ketorolac  (TORADOL) 30 MG/ML injection 30 mg (not administered)  iopamidol (ISOVUE-300) 61 % injection (not administered)     Initial Impression / Assessment and Plan / ED Course  I have reviewed the triage vital signs and the nursing notes.  Pertinent labs & imaging results that were available during my care of the patient were reviewed by me and considered in my medical decision making (see chart for details).     Patient with abdominal pain for 4 months, worse in the last few days.  Was diagnosed with a complex cystic mass, has not followed up with OB/GYN.  Patient has diffuse abdominal tenderness on exam today.  She is also reporting constipation.  Will check labs, do what pelvic exam, will get CT abdomen pelvis for further evaluation.  6:30 PM I went in to do a pelvic exam. I had a student with me who has evaluated pt first. I asked her if pt was OK with student doing her exam with me supervising, to which pt refused. I asked if pt is comfortable with friend staying in the room, pt stated yes. Pt's friend was standing right at the end of the bed where I was doing exam and I asked him to please sit in the chair. I then asked pt to slide down to the end of the bed. As we were setting up tubes pt's friend asked to speak to mysuperviser. I went out of the room, and pt's friend followed out in the hallway raising voice and stating that he demands to speak to supervisor. . I asked hip to please step in the room and we will get supervisor to which he said that "I need to stop demanding things from him and that I was rude."   6:41 PM Spoke with dr. Dalene Seltzer who will see pt  Pt further treated and dispositioned by Dr. Dalene Seltzer.   Vitals:   11/02/17 1800 11/02/17 1830 11/02/17 1900 11/02/17 2000  BP: (!) 150/95 (!) 145/87 (!) 144/91 (!) 150/83  Pulse: (!) 111 89 93 91  Resp:      Temp:      TempSrc:      SpO2: 100% 100% 100% 100%     Final Clinical Impressions(s) / ED Diagnoses   Final  diagnoses:  Lower urinary tract infectious disease  BV (bacterial vaginosis)  Constipation, unspecified constipation type    ED Discharge Orders  None       Jaynie Crumble, PA-C 11/03/17 0002    Alvira Monday, MD 11/03/17 1406

## 2017-11-02 NOTE — Discharge Instructions (Signed)
Increased oral fluids. Take miralax daily for constipation. You can try over the counter laxitives as well. Flagyl for bacterial vaginosis until all gone. Follow up with your family doctor or OB/GYN if continue to have pelvic pain

## 2017-11-02 NOTE — ED Notes (Signed)
Pt c/o abd pain this am hx of the same  Nausea no vomiting or diarrhea  No bm for 3 days  Which is unusual for her  lmp none

## 2017-11-03 LAB — RPR: RPR: NONREACTIVE

## 2017-11-03 LAB — HIV ANTIBODY (ROUTINE TESTING W REFLEX): HIV Screen 4th Generation wRfx: NONREACTIVE

## 2017-11-04 LAB — GC/CHLAMYDIA PROBE AMP (~~LOC~~) NOT AT ARMC
Chlamydia: NEGATIVE
Neisseria Gonorrhea: POSITIVE — AB

## 2017-11-04 MED FILL — metroNIDAZOLE 500 MG TABS: 500 | 7 days supply | Qty: 14 | Fill #0

## 2017-11-19 ENCOUNTER — Encounter: Payer: Self-pay | Admitting: *Deleted

## 2018-01-25 ENCOUNTER — Encounter (HOSPITAL_COMMUNITY): Payer: Self-pay | Admitting: Emergency Medicine

## 2018-01-25 ENCOUNTER — Emergency Department (HOSPITAL_COMMUNITY)
Admission: EM | Admit: 2018-01-25 | Discharge: 2018-01-25 | Disposition: A | Discharge status: Home / Self Care | Payer: Self-pay | Attending: Emergency Medicine | Admitting: Emergency Medicine

## 2018-01-25 ENCOUNTER — Other Ambulatory Visit: Payer: Self-pay

## 2018-01-25 ENCOUNTER — Emergency Department (HOSPITAL_COMMUNITY): Payer: Self-pay

## 2018-01-25 DIAGNOSIS — Z9114 Patient's other noncompliance with medication regimen: Secondary | ICD-10-CM | POA: Insufficient documentation

## 2018-01-25 DIAGNOSIS — N898 Other specified noninflammatory disorders of vagina: Secondary | ICD-10-CM | POA: Insufficient documentation

## 2018-01-25 DIAGNOSIS — R569 Unspecified convulsions: Secondary | ICD-10-CM | POA: Insufficient documentation

## 2018-01-25 DIAGNOSIS — M25512 Pain in left shoulder: Secondary | ICD-10-CM | POA: Insufficient documentation

## 2018-01-25 DIAGNOSIS — F17210 Nicotine dependence, cigarettes, uncomplicated: Secondary | ICD-10-CM | POA: Insufficient documentation

## 2018-01-25 DIAGNOSIS — Z79899 Other long term (current) drug therapy: Secondary | ICD-10-CM | POA: Insufficient documentation

## 2018-01-25 DIAGNOSIS — R451 Restlessness and agitation: Secondary | ICD-10-CM | POA: Insufficient documentation

## 2018-01-25 DIAGNOSIS — F10929 Alcohol use, unspecified with intoxication, unspecified: Secondary | ICD-10-CM | POA: Insufficient documentation

## 2018-01-25 DIAGNOSIS — R55 Syncope and collapse: Secondary | ICD-10-CM | POA: Insufficient documentation

## 2018-01-25 DIAGNOSIS — I10 Essential (primary) hypertension: Secondary | ICD-10-CM | POA: Insufficient documentation

## 2018-01-25 LAB — URINALYSIS, ROUTINE W REFLEX MICROSCOPIC
Bilirubin Urine: NEGATIVE
Glucose, UA: NEGATIVE mg/dL
HGB URINE DIPSTICK: NEGATIVE
Ketones, ur: NEGATIVE mg/dL
Leukocytes, UA: NEGATIVE
Nitrite: NEGATIVE
PH: 6.5 (ref 5.0–8.0)
Protein, ur: NEGATIVE mg/dL
SPECIFIC GRAVITY, URINE: 1.02 (ref 1.005–1.030)

## 2018-01-25 LAB — COMPREHENSIVE METABOLIC PANEL
ALT: 17 U/L (ref 14–54)
AST: 18 U/L (ref 15–41)
Albumin: 4.1 g/dL (ref 3.5–5.0)
Alkaline Phosphatase: 45 U/L (ref 38–126)
Anion gap: 8 (ref 5–15)
BUN: 14 mg/dL (ref 6–20)
CHLORIDE: 111 mmol/L (ref 101–111)
CO2: 26 mmol/L (ref 22–32)
Calcium: 9.2 mg/dL (ref 8.9–10.3)
Creatinine, Ser: 0.8 mg/dL (ref 0.44–1.00)
GFR calc non Af Amer: >60 mL/min (ref >60)
Glucose, Bld: 94 mg/dL (ref 65–99)
POTASSIUM: 3.9 mmol/L (ref 3.5–5.1)
SODIUM: 145 mmol/L (ref 135–145)
Total Bilirubin: 0.4 mg/dL (ref 0.3–1.2)
Total Protein: 7.3 g/dL (ref 6.5–8.1)

## 2018-01-25 LAB — CBG MONITORING, ED: GLUCOSE-CAPILLARY: 87 mg/dL (ref 65–99)

## 2018-01-25 LAB — CBC WITH DIFFERENTIAL/PLATELET
Basophils Absolute: 0 10*3/uL (ref 0.0–0.1)
Basophils Relative: 0 %
EOS ABS: 0.1 10*3/uL (ref 0.0–0.7)
Eosinophils Relative: 1 %
HEMATOCRIT: 39.3 % (ref 36.0–46.0)
HEMOGLOBIN: 12.8 g/dL (ref 12.0–15.0)
LYMPHS ABS: 3.4 10*3/uL (ref 0.7–4.0)
LYMPHS PCT: 48 %
MCH: 30 pg (ref 26.0–34.0)
MCHC: 32.6 g/dL (ref 30.0–36.0)
MCV: 92.3 fL (ref 78.0–100.0)
MONOS PCT: 7 %
Monocytes Absolute: 0.5 10*3/uL (ref 0.1–1.0)
NEUTROS PCT: 44 %
Neutro Abs: 3.2 10*3/uL (ref 1.7–7.7)
Platelets: 282 10*3/uL (ref 150–400)
RBC: 4.26 MIL/uL (ref 3.87–5.11)
RDW: 12.6 % (ref 11.5–15.5)
WBC: 7.2 10*3/uL (ref 4.0–10.5)

## 2018-01-25 LAB — RAPID URINE DRUG SCREEN, HOSP PERFORMED
AMPHETAMINES: NOT DETECTED
BARBITURATES: NOT DETECTED
Benzodiazepines: NOT DETECTED
COCAINE: NOT DETECTED
OPIATES: NOT DETECTED
TETRAHYDROCANNABINOL: NOT DETECTED

## 2018-01-25 LAB — ETHANOL: Alcohol, Ethyl (B): 181 mg/dL — ABNORMAL HIGH (ref <10)

## 2018-01-25 LAB — VALPROIC ACID LEVEL: Valproic Acid Lvl: <10 ug/mL — ABNORMAL LOW (ref 50.0–100.0)

## 2018-01-25 MED ORDER — AZITHROMYCIN 250 MG PO TABS
1000.0000 mg | ORAL_TABLET | Freq: Once | ORAL | Status: AC
  Administered 2018-01-25: 1000 mg via ORAL
  Filled 2018-01-25: qty 4

## 2018-01-25 MED ORDER — STERILE WATER FOR INJECTION IJ SOLN
INTRAMUSCULAR | Status: AC
  Administered 2018-01-25: 10 mL
  Filled 2018-01-25: qty 10

## 2018-01-25 MED ORDER — HYDROCHLOROTHIAZIDE 12.5 MG PO CAPS: 12.5000 mg | ORAL_CAPSULE | Freq: Every day | ORAL | 0 refills | Status: AC

## 2018-01-25 MED ORDER — DIVALPROEX SODIUM ER 500 MG PO TB24
1500.0000 mg | ORAL_TABLET | Freq: Every day | ORAL | Status: DC
  Administered 2018-01-25: 1500 mg via ORAL
  Filled 2018-01-25: qty 3

## 2018-01-25 MED ORDER — AMLODIPINE BESYLATE 10 MG PO TABS: 10.0000 mg | ORAL_TABLET | Freq: Every day | ORAL | 0 refills | Status: DC

## 2018-01-25 MED ORDER — DIVALPROEX SODIUM ER 250 MG PO TB24: 750.0000 mg | ORAL_TABLET | Freq: Every day | ORAL | 0 refills | Status: AC

## 2018-01-25 MED ORDER — CEFTRIAXONE SODIUM 250 MG IJ SOLR
250.0000 mg | Freq: Once | INTRAMUSCULAR | Status: AC
  Administered 2018-01-25: 250 mg via INTRAMUSCULAR
  Filled 2018-01-25: qty 250

## 2018-01-25 MED ORDER — KETOROLAC TROMETHAMINE 15 MG/ML IJ SOLN
15.0000 mg | Freq: Once | INTRAMUSCULAR | Status: AC
  Administered 2018-01-25: 15 mg via INTRAVENOUS
  Filled 2018-01-25: qty 1
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
## 2018-01-25 NOTE — ED Notes (Signed)
Shrift at bedside with patient at this time.

## 2018-01-25 NOTE — ED Notes (Signed)
Bed: GN56WA11 Expected date:  Expected time:  Means of arrival:  Comments: EMS-from jail-found unresponsive-now alert

## 2018-01-25 NOTE — ED Notes (Signed)
Pt's CBG=87 

## 2018-01-25 NOTE — Discharge Instructions (Addendum)
A refill of your Depakote and blood pressure medicines has been provided. Please follow up with your doctor for additional refills Please eat and drink a normal diet, avoid stimulant use such as cough and cold medication, alcohol, tobacco, or drugs of abuse. This can cause additional seizures Return if worsening

## 2018-01-26 LAB — RPR: RPR: NONREACTIVE

## 2018-01-26 LAB — HIV ANTIBODY (ROUTINE TESTING W REFLEX): HIV Screen 4th Generation wRfx: NONREACTIVE

## 2018-01-27 LAB — GC/CHLAMYDIA PROBE AMP (~~LOC~~) NOT AT ARMC
CHLAMYDIA, DNA PROBE: NEGATIVE
Neisseria Gonorrhea: NEGATIVE

## 2018-04-14 ENCOUNTER — Emergency Department (HOSPITAL_COMMUNITY)
Admission: EM | Admit: 2018-04-14 | Discharge: 2018-04-14 | Disposition: A | Discharge status: Home / Self Care | Payer: Self-pay | Attending: Emergency Medicine | Admitting: Emergency Medicine

## 2018-04-14 ENCOUNTER — Encounter (HOSPITAL_COMMUNITY): Payer: Self-pay

## 2018-04-14 ENCOUNTER — Other Ambulatory Visit: Payer: Self-pay

## 2018-04-14 DIAGNOSIS — F17210 Nicotine dependence, cigarettes, uncomplicated: Secondary | ICD-10-CM | POA: Insufficient documentation

## 2018-04-14 DIAGNOSIS — F1210 Cannabis abuse, uncomplicated: Secondary | ICD-10-CM | POA: Insufficient documentation

## 2018-04-14 DIAGNOSIS — Z113 Encounter for screening for infections with a predominantly sexual mode of transmission: Secondary | ICD-10-CM | POA: Insufficient documentation

## 2018-04-14 DIAGNOSIS — R319 Hematuria, unspecified: Secondary | ICD-10-CM | POA: Insufficient documentation

## 2018-04-14 DIAGNOSIS — Z79899 Other long term (current) drug therapy: Secondary | ICD-10-CM | POA: Insufficient documentation

## 2018-04-14 DIAGNOSIS — M545 Low back pain: Secondary | ICD-10-CM | POA: Insufficient documentation

## 2018-04-14 DIAGNOSIS — I10 Essential (primary) hypertension: Secondary | ICD-10-CM | POA: Insufficient documentation

## 2018-04-14 DIAGNOSIS — N3001 Acute cystitis with hematuria: Secondary | ICD-10-CM | POA: Insufficient documentation

## 2018-04-14 LAB — CBC WITH DIFFERENTIAL/PLATELET
Abs Immature Granulocytes: 0 10*3/uL (ref 0.0–0.1)
Basophils Absolute: 0 10*3/uL (ref 0.0–0.1)
Basophils Relative: 0 %
Eosinophils Absolute: 0.1 10*3/uL (ref 0.0–0.7)
Eosinophils Relative: 1 %
HEMATOCRIT: 38.6 % (ref 36.0–46.0)
HEMOGLOBIN: 11.8 g/dL — AB (ref 12.0–15.0)
IMMATURE GRANULOCYTES: 0 %
LYMPHS ABS: 2 10*3/uL (ref 0.7–4.0)
LYMPHS PCT: 32 %
MCH: 29.2 pg (ref 26.0–34.0)
MCHC: 30.6 g/dL (ref 30.0–36.0)
MCV: 95.5 fL (ref 78.0–100.0)
Monocytes Absolute: 0.5 10*3/uL (ref 0.1–1.0)
Monocytes Relative: 8 %
NEUTROS ABS: 3.7 10*3/uL (ref 1.7–7.7)
NEUTROS PCT: 59 %
Platelets: 247 10*3/uL (ref 150–400)
RBC: 4.04 MIL/uL (ref 3.87–5.11)
RDW: 12.4 % (ref 11.5–15.5)
WBC: 6.4 10*3/uL (ref 4.0–10.5)

## 2018-04-14 LAB — WET PREP, GENITAL
SPERM: NONE SEEN
TRICH WET PREP: NONE SEEN
YEAST WET PREP: NONE SEEN

## 2018-04-14 LAB — URINALYSIS, ROUTINE W REFLEX MICROSCOPIC
BILIRUBIN URINE: NEGATIVE
Glucose, UA: NEGATIVE mg/dL
KETONES UR: NEGATIVE mg/dL
Nitrite: NEGATIVE
PH: 5 (ref 5.0–8.0)
Protein, ur: NEGATIVE mg/dL
Specific Gravity, Urine: 1.025 (ref 1.005–1.030)

## 2018-04-14 LAB — BASIC METABOLIC PANEL
ANION GAP: 5 (ref 5–15)
BUN: 17 mg/dL (ref 6–20)
CALCIUM: 9.3 mg/dL (ref 8.9–10.3)
CHLORIDE: 110 mmol/L (ref 101–111)
CO2: 27 mmol/L (ref 22–32)
Creatinine, Ser: 0.87 mg/dL (ref 0.44–1.00)
GFR calc non Af Amer: >60 mL/min (ref >60)
Glucose, Bld: 76 mg/dL (ref 65–99)
Potassium: 3.9 mmol/L (ref 3.5–5.1)
SODIUM: 142 mmol/L (ref 135–145)

## 2018-04-14 MED ORDER — IBUPROFEN 800 MG PO TABS
800.0000 mg | ORAL_TABLET | Freq: Once | ORAL | Status: AC
  Administered 2018-04-14: 800 mg via ORAL
  Filled 2018-04-14: qty 1

## 2018-04-14 MED ORDER — AMLODIPINE BESYLATE 10 MG PO TABS: 10.0000 mg | ORAL_TABLET | Freq: Every day | ORAL | 0 refills | Status: AC

## 2018-04-14 MED ORDER — AZITHROMYCIN 250 MG PO TABS
1000.0000 mg | ORAL_TABLET | Freq: Once | ORAL | Status: AC
  Administered 2018-04-14: 1000 mg via ORAL
  Filled 2018-04-14: qty 4

## 2018-04-14 MED ORDER — CEFTRIAXONE SODIUM 250 MG IJ SOLR
250.0000 mg | Freq: Once | INTRAMUSCULAR | Status: AC
  Administered 2018-04-14: 250 mg via INTRAMUSCULAR
  Filled 2018-04-14: qty 250

## 2018-04-14 MED ORDER — CEPHALEXIN 500 MG PO CAPS: 500.0000 mg | ORAL_CAPSULE | Freq: Two times a day (BID) | ORAL | 0 refills | Status: DC

## 2018-04-14 MED ORDER — IBUPROFEN 600 MG PO TABS: 600.0000 mg | ORAL_TABLET | Freq: Three times a day (TID) | ORAL | 0 refills | Status: AC

## 2018-04-14 MED ORDER — LIDOCAINE HCL (PF) 1 % IJ SOLN
INTRAMUSCULAR | Status: AC
  Administered 2018-04-14: 5 mL
  Filled 2018-04-14: qty 5

## 2018-04-14 MED ORDER — IBUPROFEN 800 MG PO TABS: 800.0000 mg | ORAL_TABLET | Freq: Three times a day (TID) | ORAL | 0 refills | Status: DC

## 2018-04-14 NOTE — ED Triage Notes (Signed)
Lower back pain into your lower abdominal area with cramping/  Pt.is having nausea and weakness.  Pt. Is having hematuria and dsyuria, urgency.  Pt. Denies any vaginal discharge   Pt. Would like to have pelvic examination.  Denies any vaginal bleeding had a hysterectomy.    Skin is warm and dry.

## 2018-04-14 NOTE — ED Provider Notes (Signed)
[facility]   CSN: [id number] Arrival date & time: 04/14/18  1304     History   Chief Complaint Chief Complaint  Patient presents with  . Dysuria  . Hematuria    HPI Stacy Moss is a 27 y.o. female.  HPI   27 year old female presents today with complaints of dysuria and hematuria.  Patient notes lower pelvic pain, blood in her urine and dysuria.  She notes 3-day history of the same, she denies any upper abdominal pain nausea vomiting or fever.  She denies any vaginal discharge but is requesting pelvic exam with STD testing as she is sexually active.  Reports some minor lower back pain.    Past Medical History:  Diagnosis Date  . ADD (attention deficit disorder)   . Anxiety   . Bipolar affective (HCC)   . Depression   . History of cocaine use 10/2014  . History of gestational diabetes mellitus (GDM)    with prior pregnancy  . History of marijuana use   . History of suicidal ideation   . History of VBAC   . Hx of eye surgery 02/2015  . Hx of tonsillectomy   . Hypertension    on meds 2011  . Mental disorder   . Polysubstance abuse (HCC) 10/2014   Ectasy use  . S/P cesarean section 05/25/2012  . S/p supracervical hysterectomy and bilateral salpingectomy on 09/11/15 09/11/2015  . Seizures [facility](HCC)    age [age]- after fell and hit head  . Smoker     Patient Active Problem List   Diagnosis Date Noted  . Pain in joint, pelvic region and thigh 07/18/2017  . Chronic female pelvic pain 07/18/2017  . PTSD (post-traumatic stress disorder) 07/09/2017  . Bipolar disorder with moderate depression (HCC) 07/09/2017  . Cocaine use disorder, mild, abuse (HCC) 07/09/2017  . Cannabis use disorder, mild, abuse 07/09/2017  . Prolonged Q-T interval on ECG 07/09/2017  . Overdose of antipsychotic, intentional self-harm, sequela (HCC) 07/02/2017  . Polysubstance abuse (HCC) 07/02/2017  . Somnolence 07/02/2017  . Uterine rupture  during labor 09/11/2015  . S/p supracervical hysterectomy and bilateral salpingectomy on 09/11/15 09/11/2015  . Delayed postpartum hemorrhage x 3 episodes 08/16/2015  . S/P emergency cesarean section for uterine rupture on 08/11/15 08/11/2015  . 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 History of suicidal ideation 03/15/2015  . Intermittent palpitations 03/14/2015  . Syncopal episodes 03/09/2015    Past Surgical History:  Procedure Laterality Date  . ABDOMINAL HYSTERECTOMY N/A 09/11/2015   Procedure: SUPRACERVICAL ABDONIMAL HYSTERECTOMY, ;  Surgeon: Tereso NewcomerUgonna A Anyanwu, MD;  Location: [facility] ORS;  Service: Gynecology;  Laterality: N/A;  . BILATERAL SALPINGECTOMY  09/11/2015   Procedure: BILATERAL SALPINGECTOMY;  Surgeon: Tereso NewcomerUgonna A Anyanwu, MD;  Location: [facility] OR[facility];  Service: Gynecology;;  . CESAREAN SECTION  05/25/2012   Procedure: CESAREAN SECTION;  Surgeon: Sherron MondayJody Bovard, MD;  Location: [facility] OR[facility];  Service: Gynecology;  Laterality: N/A;  Primary cesarean section with delivery of baby girl at 0700. Apgars9/9.  Marland Kitchen. CESAREAN SECTION N/A 08/11/2015   Procedure: CESAREAN SECTION;  Surgeon: Lazaro ArmsLuther H Eure, MD;  Location: [facility] ORS;  Service: Obstetrics;  Laterality: N/A;  . ORIF ORBITAL FRACTURE Left 02/25/2015   Procedure: OPEN REDUCTION INTERNAL FIXATION (ORIF) LEFT ORBITAL FRACTURE;  Surgeon: Melvenia BeamMitchell Gore, MD;  [facility];  Service: ENT;  Laterality: Left;  . TONSILLECTOMY       OB History    Gravida  5  Para  3   Term  3   Preterm  0   AB  2   Living  3     SAB  2   TAB  0   Ectopic  0   Multiple  0   Live Births  3            Home Medications    Prior to Admission medications   Medication Sig Start Date End Date Taking? Authorizing Provider  amLODipine (NORVASC) 10 MG tablet Take 1 tablet (10 mg total) by mouth daily. For high blood pressure 01/25/18   Bethel Born, PA-C  cephALEXin (KEFLEX) 500 MG capsule Take 1 capsule (500 mg total) by mouth 2  (two) times daily. 04/14/18   Rayshawn Visconti, Tinnie Gens, PA-C  divalproex (DEPAKOTE ER) 250 MG 24 hr tablet Take 3 tablets (750 mg total) by mouth at bedtime. For mood control 01/25/18   Bethel Born, PA-C  hydrochlorothiazide (MICROZIDE) 12.5 MG capsule Take 1 capsule (12.5 mg total) by mouth daily. For high blood pressure 01/25/18   Bethel Born, PA-C  ibuprofen (ADVIL,MOTRIN) 200 MG tablet Take 200 mg by mouth every 6 (six) hours as needed for moderate pain.    [provider]    Family History Family History  Problem Relation Age of Onset  . Cancer Maternal Grandmother   . Hypertension Maternal Grandmother   . Hypertension Mother   . Anesthesia problems Neg Hx     Social History Social History   Tobacco Use  . Smoking status: Current Every Day Smoker    Packs/day: 0.25    Years: 10.00    Pack years: 2.50    Types: Cigarettes  . Smokeless tobacco: Never Used  Substance Use Topics  . Alcohol use: Yes    Alcohol/week: 0.0 oz    Comment: "I had one drink socially"   . Drug use: Yes    Types: Marijuana    Comment: History of cocaine and ecstasy use.      Allergies   Patient has no known allergies.   Review of Systems Review of Systems  All other systems reviewed and are negative.    Physical Exam Updated Vital Signs BP (!) 151/82 (BP Location: Right Arm)   Pulse 65   Temp 98.2 F (36.8 C) (Oral)   Resp 18   Ht 5\' 9"  (1.753 m)   Wt 95.3 kg (210 lb)   LMP 10/22/2014 (Approximate)   SpO2 100%   BMI 31.01 kg/m   Physical Exam  Constitutional: She is oriented to person, place, and time. She appears well-developed and well-nourished.  HENT:  Head: Normocephalic and atraumatic.  Eyes: Pupils are equal, round, and reactive to light. Conjunctivae are normal. Right eye exhibits no discharge. Left eye exhibits no discharge. No scleral icterus.  Neck: Normal range of motion. No JVD present. No tracheal deviation present.  Pulmonary/Chest: Effort normal. No  stridor.  Abdominal:  Minor tenderness palpation of the bilateral lower pelvis and suprapubic region, nonfocal - no CVA tenderness  Genitourinary:  Genitourinary Comments: Small amount of white vaginal discharge, no cervical motion tenderness, adnexal tenderness or masses  Neurological: She is alert and oriented to person, place, and time. Coordination normal.  Psychiatric: She has a normal mood and affect. Her behavior is normal. Judgment and thought content normal.  Nursing note and vitals reviewed.  ED Treatments / Results  Labs (all labs ordered are listed, but only abnormal results are displayed) Labs Reviewed  WET  PREP, GENITAL - Abnormal; Notable for the following components:      Result Value   Clue Cells Wet Prep HPF POC PRESENT (*)    WBC, Wet Prep HPF POC FEW (*)    All other components within normal limits  CBC WITH DIFFERENTIAL/PLATELET - Abnormal; Notable for the following components:   Hemoglobin 11.8 (*)    All other components within normal limits  URINALYSIS, ROUTINE W REFLEX MICROSCOPIC - Abnormal; Notable for the following components:   Hgb urine dipstick SMALL (*)    Leukocytes, UA TRACE (*)    Bacteria, UA RARE (*)    Non Squamous Epithelial 0-5 (*)    All other components within normal limits  URINE CULTURE  BASIC METABOLIC PANEL  RPR  HIV ANTIBODY (ROUTINE TESTING)  GC/CHLAMYDIA PROBE AMP ([place]) NOT AT [facility]    EKG None  Radiology No results found.  Procedures Procedures (including critical care time)  Medications Ordered in ED Medications  cefTRIAXone (ROCEPHIN) injection 250 mg (has no administration in time range)  azithromycin (ZITHROMAX) tablet 1,000 mg (has no administration in time range)  ibuprofen (ADVIL,MOTRIN) tablet 800 mg (800 mg Oral Given 04/14/18 1643)     Initial Impression / Assessment and Plan / ED Course  I have reviewed the triage vital signs and the nursing notes.  Pertinent labs & imaging results that were  available during my care of the patient were reviewed by me and considered in my medical decision making (see chart for details).    Labs: Wet prep, urine culture, CBC, BMP RPR, HIV, urinalysis  Imaging:  Consults:  Therapeutics: ceftriaxone, azithromycin   Discharge Meds: keflex  Assessment/Plan: 27 year old female presents today with complaints of dysuria and pelvic pain.  Patient is sexually active adamantly requesting pelvic exam.  Patient is having dysuria, uncertain if this is secondary to STD versus urinary tract infection.  She does report hematuria she does have small amount of blood, trace leukocytes 21-50 WBCs.  Given her clinical symptoms and concern for STD she will be treated for STD and discharged with Keflex for urinary tract infection.  Patient is given strict return precautions, she verbalized understanding and agreement to today's plan had no further questions or concerns.   Final Clinical Impressions(s) / ED Diagnoses   Final diagnoses:  Acute cystitis with hematuria    ED Discharge Orders        Ordered    cephALEXin (KEFLEX) 500 MG capsule  2 times daily     04/14/18 1744       Eyvonne Mechanic, PA-C 04/14/18 1745    Raeford Razor, MD 04/16/18 1246

## 2018-04-14 NOTE — ED Notes (Signed)
Patient verbalized understanding of discharge instructions and f/u information; she denies any further needs or questions at this time. VS stable. Patient ambulatory with steady gait.

## 2018-04-14 NOTE — ED Notes (Signed)
Pelvic cart set up at bedside  

## 2018-04-14 NOTE — Discharge Instructions (Addendum)
Please read attached information. If you experience any new or worsening signs or symptoms please return to the emergency room for evaluation. Please follow-up with your primary care provider or specialist as discussed. Please use medication prescribed only as directed and discontinue taking if you have any concerning signs or symptoms.   °

## 2018-04-14 NOTE — ED Provider Notes (Signed)
Patient placed in Quick Look pathway, seen and evaluated   Chief Complaint: UTI symptoms  HPI:   Stacy Moss is a 27 y.o. female with hx of hysterectomy, polysubstance abuse HTN, seizures, smoker who presents to the ED with abdominal pain, hematuria and dysuria that started a couple days ago and is getting worse. Today patient has started having back pain. Patient also request STI testing.   ROS: GU: dysuria, frequency M/S: back pain  Physical Exam:  BP (!) 151/82 (BP Location: Right Arm)   Pulse 65   Temp 98.2 F (36.8 C) (Oral)   Resp 18   Ht 5\' 9"  (1.753 m)   Wt 95.3 kg (210 lb)   LMP 10/22/2014 (Approximate)   SpO2 100%   BMI 31.01 kg/m    Gen: No distress  Neuro: Awake and Alert  Skin: Warm and dry  Abdomen: suprapubic pain, right CVA tenderness         Initiation of care has begun. The patient has been counseled on the process, plan, and necessity for staying for the completion/evaluation, and the remainder of the medical screening examination    Janne Napoleoneese, Stacy M, NP 04/14/18 1431    Loren Moss, David, MD 04/15/18 [phone]

## 2018-04-15 LAB — URINE CULTURE

## 2018-04-15 LAB — RPR: RPR Ser Ql: NONREACTIVE

## 2018-04-15 LAB — HIV ANTIBODY (ROUTINE TESTING W REFLEX): HIV Screen 4th Generation wRfx: NONREACTIVE

## 2018-04-15 LAB — GC/CHLAMYDIA PROBE AMP (~~LOC~~) NOT AT ARMC
Chlamydia: NEGATIVE
NEISSERIA GONORRHEA: NEGATIVE

## 2018-06-28 ENCOUNTER — Other Ambulatory Visit: Payer: Self-pay

## 2018-06-28 ENCOUNTER — Emergency Department (HOSPITAL_COMMUNITY)
Admission: EM | Admit: 2018-06-28 | Discharge: 2018-06-28 | Disposition: A | Discharge status: Home / Self Care | Payer: Medicaid Other | Attending: Emergency Medicine | Admitting: Emergency Medicine

## 2018-06-28 ENCOUNTER — Encounter (HOSPITAL_COMMUNITY): Payer: Self-pay | Admitting: *Deleted

## 2018-06-28 DIAGNOSIS — Z202 Contact with and (suspected) exposure to infections with a predominantly sexual mode of transmission: Secondary | ICD-10-CM | POA: Insufficient documentation

## 2018-06-28 DIAGNOSIS — I10 Essential (primary) hypertension: Secondary | ICD-10-CM | POA: Insufficient documentation

## 2018-06-28 DIAGNOSIS — R1030 Lower abdominal pain, unspecified: Secondary | ICD-10-CM

## 2018-06-28 DIAGNOSIS — B009 Herpesviral infection, unspecified: Secondary | ICD-10-CM

## 2018-06-28 DIAGNOSIS — Z79899 Other long term (current) drug therapy: Secondary | ICD-10-CM | POA: Insufficient documentation

## 2018-06-28 DIAGNOSIS — F17210 Nicotine dependence, cigarettes, uncomplicated: Secondary | ICD-10-CM | POA: Insufficient documentation

## 2018-06-28 DIAGNOSIS — N898 Other specified noninflammatory disorders of vagina: Secondary | ICD-10-CM

## 2018-06-28 DIAGNOSIS — B002 Herpesviral gingivostomatitis and pharyngotonsillitis: Secondary | ICD-10-CM | POA: Insufficient documentation

## 2018-06-28 LAB — COMPREHENSIVE METABOLIC PANEL
ALT: 13 U/L (ref 0–44)
AST: 19 U/L (ref 15–41)
Albumin: 4 g/dL (ref 3.5–5.0)
Alkaline Phosphatase: 46 U/L (ref 38–126)
Anion gap: 9 (ref 5–15)
BUN: 16 mg/dL (ref 6–20)
CHLORIDE: 102 mmol/L (ref 98–111)
CO2: 27 mmol/L (ref 22–32)
CREATININE: 1.18 mg/dL — AB (ref 0.44–1.00)
Calcium: 9.3 mg/dL (ref 8.9–10.3)
GFR calc non Af Amer: >60 mL/min (ref >60)
Glucose, Bld: 68 mg/dL — ABNORMAL LOW (ref 70–99)
Potassium: 3.6 mmol/L (ref 3.5–5.1)
SODIUM: 138 mmol/L (ref 135–145)
Total Bilirubin: 0.5 mg/dL (ref 0.3–1.2)
Total Protein: 6.9 g/dL (ref 6.5–8.1)

## 2018-06-28 LAB — WET PREP, GENITAL
Sperm: NONE SEEN
TRICH WET PREP: NONE SEEN
YEAST WET PREP: NONE SEEN

## 2018-06-28 LAB — URINALYSIS, ROUTINE W REFLEX MICROSCOPIC
Bilirubin Urine: NEGATIVE
GLUCOSE, UA: NEGATIVE mg/dL
Hgb urine dipstick: NEGATIVE
KETONES UR: NEGATIVE mg/dL
LEUKOCYTES UA: NEGATIVE
NITRITE: NEGATIVE
PROTEIN: NEGATIVE mg/dL
Specific Gravity, Urine: 1.019 (ref 1.005–1.030)
pH: 5 (ref 5.0–8.0)

## 2018-06-28 LAB — CBC
HCT: 40.5 % (ref 36.0–46.0)
Hemoglobin: 12.8 g/dL (ref 12.0–15.0)
MCH: 29.8 pg (ref 26.0–34.0)
MCHC: 31.6 g/dL (ref 30.0–36.0)
MCV: 94.2 fL (ref 78.0–100.0)
PLATELETS: 268 10*3/uL (ref 150–400)
RBC: 4.3 MIL/uL (ref 3.87–5.11)
RDW: 11.9 % (ref 11.5–15.5)
WBC: 7.6 10*3/uL (ref 4.0–10.5)

## 2018-06-28 LAB — LIPASE, BLOOD: LIPASE: 35 U/L (ref 11–51)

## 2018-06-28 MED ORDER — ACYCLOVIR 400 MG PO TABS: 800.0000 mg | ORAL_TABLET | Freq: Every day | ORAL | 0 refills | Status: AC

## 2018-06-28 MED ORDER — DOXYCYCLINE HYCLATE 100 MG PO CAPS: 100.0000 mg | ORAL_CAPSULE | Freq: Two times a day (BID) | ORAL | 0 refills | Status: DC

## 2018-06-28 MED ORDER — CEFTRIAXONE SODIUM 250 MG IJ SOLR
250.0000 mg | Freq: Once | INTRAMUSCULAR | Status: AC
  Administered 2018-06-28: 250 mg via INTRAMUSCULAR
  Filled 2018-06-28: qty 250

## 2018-06-28 MED ORDER — LIDOCAINE HCL (PF) 1 % IJ SOLN
INTRAMUSCULAR | Status: AC
  Administered 2018-06-28: 0.9 mL
  Filled 2018-06-28: qty 5

## 2018-06-28 MED ORDER — DOXYCYCLINE HYCLATE 100 MG PO TABS
100.0000 mg | ORAL_TABLET | Freq: Once | ORAL | Status: AC
  Administered 2018-06-28: 100 mg via ORAL
  Filled 2018-06-28: qty 1

## 2018-06-28 NOTE — ED Triage Notes (Signed)
THE PT WANTS TO BE CHECKED FOR ALL STDS

## 2018-06-28 NOTE — ED Notes (Signed)
Main lab to add on RPR and HIV

## 2018-06-28 NOTE — ED Notes (Signed)
ED Provider at bedside. 

## 2018-06-28 NOTE — Discharge Instructions (Addendum)
1. Medications: doxycycline, acyclovir, usual home medications 2. Treatment: rest, drink plenty of fluids, use a condom with every sexual encounter; warm compresses for the lesion to the right labia 3. Follow Up: Please followup with your primary doctor in 3-5 days for discussion of your diagnoses, repeat BP check, repeat labwork and further evaluation after today's visit; if you do not have a primary care doctor use the resource guide provided to find one; Please return to the ER for worsening symptoms, high fevers or persistent vomiting.  You have been tested for HIV, syphilis, chlamydia and gonorrhea.  These results will be available in approximately 3 days.  Please inform all sexual partners if you test positive for any of these diseases.

## 2018-06-28 NOTE — ED Provider Notes (Signed)
[facility] EMERGENCY DEPARTMENT Provider Note   CSN: [id number] Arrival date & time: 06/28/18  1959     History   Chief Complaint Chief Complaint  Patient presents with  . Abdominal Pain    HPI Stacy Moss is a 27 y.o. female with a hx of anxiety, bipolar disorder, polysubstance abuse, hypertension, seizures presents to the Emergency Department complaining of gradual, persistent, progressively worsening vaginal discharge and lower abdominal pain onset approximately 5 days ago.  Patient reports she has associated dysuria.  She reports she has been sexually active with one female partner who has a genital rash and penile discharge.  She requests testing for STDs.  She does additionally reports she has had several " bumps" in her genital area and around her mouth.  She reports that a family member of the man she is sexually active with reported that he has a history of herpes.  She states she has never had herpes.  No treatments prior to arrival.  Nothing seems to make her symptoms better.  She denies headache, neck pain, chest pain, shortness of breath, fever, chills, nausea, vomiting, diarrhea, weakness, dizziness, syncope.  Patient has a history of abdominal hysterectomy.  Reports compliance with her hypertension and seizure medications.  The history is provided by the patient and medical records. No language interpreter was used.    Past Medical History:  Diagnosis Date  . ADD (attention deficit disorder)   . Anxiety   . Bipolar affective (HCC)   . Depression   . History of cocaine use 10/2014  . History of gestational diabetes mellitus (GDM)    with prior pregnancy  . History of marijuana use   . History of suicidal ideation   . History of VBAC   . Hx of eye surgery 02/2015  . Hx of tonsillectomy   . Hypertension    on meds 2011  . Mental disorder   . Polysubstance abuse (HCC) 10/2014   Ectasy use  . S/P cesarean section 05/25/2012  . S/p supracervical  hysterectomy and bilateral salpingectomy on 09/11/15 09/11/2015  . Seizures [facility])    age [age]- after fell and hit head  . Smoker     Patient Active Problem List   Diagnosis Date Noted  . Pain in joint, pelvic region and thigh 07/18/2017  . Chronic female pelvic pain 07/18/2017  . PTSD (post-traumatic stress disorder) 07/09/2017  . Bipolar disorder with moderate depression (HCC) 07/09/2017  . Cocaine use disorder, mild, abuse (HCC) 07/09/2017  . Cannabis use disorder, mild, abuse 07/09/2017  . Prolonged Q-T interval on ECG 07/09/2017  . Overdose of antipsychotic, intentional self-harm, sequela (HCC) 07/02/2017  . Polysubstance abuse (HCC) 07/02/2017  . Somnolence 07/02/2017  . Uterine rupture during labor 09/11/2015  . S/p supracervical hysterectomy and bilateral salpingectomy on 09/11/15 09/11/2015  . Delayed postpartum hemorrhage x 3 episodes 08/16/2015  . S/P emergency cesarean section for uterine rupture on 08/11/15 08/11/2015  . 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 History of suicidal ideation 03/15/2015  . Intermittent palpitations 03/14/2015  . Syncopal episodes 03/09/2015    Past Surgical History:  Procedure Laterality Date  . ABDOMINAL HYSTERECTOMY N/A 09/11/2015   Procedure: SUPRACERVICAL ABDONIMAL HYSTERECTOMY, ;  Surgeon: Tereso Newcomer, MD;  Location: [facility] ORS;  Service: Gynecology;  Laterality: N/A;  . BILATERAL SALPINGECTOMY  09/11/2015   Procedure: BILATERAL SALPINGECTOMY;  Surgeon: Tereso Newcomer, MD;  Location: [facility] ORS;  Service: Gynecology;;  . CESAREAN SECTION  05/25/2012   Procedure: CESAREAN SECTION;  Surgeon: Sherron Monday, MD;  Location: [facility] ORS;  Service: Gynecology;  Laterality: N/A;  Primary cesarean section with delivery of baby girl at 0700. Apgars9/9.  Marland Kitchen CESAREAN SECTION N/A 08/11/2015   Procedure: CESAREAN SECTION;  Surgeon: Lazaro Arms, MD;  Location: [facility] ORS;  Service: Obstetrics;  Laterality: N/A;  . ORIF ORBITAL  FRACTURE Left 02/25/2015   Procedure: OPEN REDUCTION INTERNAL FIXATION (ORIF) LEFT ORBITAL FRACTURE;  Surgeon: Melvenia Beam, MD;  Location[facility];  Service: ENT;  Laterality: Left;  . TONSILLECTOMY       OB History    Gravida  5   Para  3   Term  3   Preterm  0   AB  2   Living  3     SAB  2   TAB  0   Ectopic  0   Multiple  0   Live Births  3            Home Medications    Prior to Admission medications   Medication Sig Start Date End Date Taking? Authorizing Provider  amLODipine (NORVASC) 10 MG tablet Take 1 tablet (10 mg total) by mouth daily. For high blood pressure 04/14/18  Yes Hedges, Tinnie Gens, PA-C  divalproex (DEPAKOTE ER) 250 MG 24 hr tablet Take 3 tablets (750 mg total) by mouth at bedtime. For mood control 01/25/18  Yes Bethel Born, PA-C  hydrochlorothiazide (MICROZIDE) 12.5 MG capsule Take 1 capsule (12.5 mg total) by mouth daily. For high blood pressure 01/25/18  Yes Bethel Born, PA-C  ibuprofen (ADVIL,MOTRIN) 200 MG tablet Take 400 mg by mouth every 6 (six) hours as needed for moderate pain.   Yes [provider]  acyclovir (ZOVIRAX) 400 MG tablet Take 2 tablets (800 mg total) by mouth 5 (five) times daily. 06/28/18   Nahun Kronberg, Dahlia Client, PA-C  doxycycline (VIBRAMYCIN) 100 MG capsule Take 1 capsule (100 mg total) by mouth 2 (two) times daily. 06/28/18   Donisha Hoch, Dahlia Client, PA-C  ibuprofen (ADVIL,MOTRIN) 600 MG tablet Take 1 tablet (600 mg total) by mouth 3 (three) times daily. Patient not taking: Reported on 06/28/2018 04/14/18   Eyvonne Mechanic, PA-C    Family History Family History  Problem Relation Age of Onset  . Cancer Maternal Grandmother   . Hypertension Maternal Grandmother   . Hypertension Mother   . Anesthesia problems Neg Hx     Social History Social History   Tobacco Use  . Smoking status: Current Every Day Smoker    Packs/day: 0.25    Years: 10.00    Pack years: 2.50    Types: Cigarettes  . Smokeless tobacco:  Never Used  Substance Use Topics  . Alcohol use: Yes    Alcohol/week: 0.0 standard drinks    Comment: "I had one drink socially"   . Drug use: Yes    Types: Marijuana    Comment: History of cocaine and ecstasy use.      Allergies   Patient has no known allergies.   Review of Systems Review of Systems  Constitutional: Negative for appetite change, diaphoresis, fatigue, fever and unexpected weight change.  HENT: Negative for mouth sores.   Eyes: Negative for visual disturbance.  Respiratory: Negative for cough, chest tightness, shortness of breath and wheezing.   Cardiovascular: Negative for chest pain.  Gastrointestinal: Positive for abdominal pain. Negative for constipation, diarrhea, nausea and vomiting.  Endocrine: Negative for polydipsia, polyphagia and polyuria.  Genitourinary: Positive for  dysuria, vaginal discharge and vaginal pain. Negative for frequency, hematuria and urgency.  Musculoskeletal: Negative for back pain and neck stiffness.  Skin: Positive for rash.  Allergic/Immunologic: Negative for immunocompromised state.  Neurological: Negative for syncope, light-headedness and headaches.  Hematological: Does not bruise/bleed easily.  Psychiatric/Behavioral: Negative for sleep disturbance. The patient is not nervous/anxious.      Physical Exam Updated Vital Signs BP (!) 157/87   Pulse 85   Temp 98.8 F (37.1 C) (Oral)   Resp 14   Ht 5\' 9"  (1.753 m)   Wt 95.3 kg   LMP 10/22/2014 (Approximate)   SpO2 100%   BMI 31.01 kg/m   Physical Exam  Constitutional: She appears well-developed and well-nourished. No distress.  Awake, alert, nontoxic appearance  HENT:  Head: Normocephalic and atraumatic.  Mouth/Throat: Oropharynx is clear and moist. No oropharyngeal exudate.  Multiple, vesicular and ulcerated lesions with an erythematous base around the mouth.  Eyes: Conjunctivae are normal. No scleral icterus.  Neck: Normal range of motion. Neck supple.    Cardiovascular: Normal rate, regular rhythm, normal heart sounds and intact distal pulses.  No murmur heard. Pulmonary/Chest: Effort normal and breath sounds normal. No respiratory distress. She has no wheezes.  Equal chest expansion  Abdominal: Soft. Bowel sounds are normal. She exhibits no mass. There is tenderness in the suprapubic area. There is no rebound and no guarding. Hernia confirmed negative in the right inguinal area and confirmed negative in the left inguinal area.  Genitourinary: Uterus normal.    No labial fusion. There is lesion on the right labia. There is no rash or tenderness on the right labia. There is no rash, tenderness or lesion on the left labia. Uterus is not deviated, not enlarged, not fixed and not tender. Cervix exhibits no motion tenderness, no discharge and no friability. Right adnexum displays no mass, no tenderness and no fullness. Left adnexum displays no mass, no tenderness and no fullness. No erythema, tenderness or bleeding in the vagina. No foreign body in the vagina. No signs of injury around the vagina. Vaginal discharge (Moderate amount, white, malodorous) found.  Genitourinary Comments: No erythematous or ulcerated lesions to the labia, introitus or vaginal vault.  Musculoskeletal: Normal range of motion. She exhibits no edema.  Lymphadenopathy:       Right: No inguinal adenopathy present.       Left: No inguinal adenopathy present.  Neurological: She is alert.  Speech is clear and goal oriented Moves extremities without ataxia  Skin: Skin is warm and dry. She is not diaphoretic. No erythema.  Psychiatric: She has a normal mood and affect.  Nursing note and vitals reviewed.    ED Treatments / Results  Labs (all labs ordered are listed, but only abnormal results are displayed) Labs Reviewed  WET PREP, GENITAL - Abnormal; Notable for the following components:      Result Value   Clue Cells Wet Prep HPF POC PRESENT (*)    WBC, Wet Prep HPF POC  MANY (*)    All other components within normal limits  COMPREHENSIVE METABOLIC PANEL - Abnormal; Notable for the following components:   Glucose, Bld 68 (*)    Creatinine, Ser 1.18 (*)    All other components within normal limits  LIPASE, BLOOD  CBC  URINALYSIS, ROUTINE W REFLEX MICROSCOPIC  RPR  HIV ANTIBODY (ROUTINE TESTING)  GC/CHLAMYDIA PROBE AMP ([place]) NOT AT [facility]     Procedures Procedures (including critical care time)  Medications Ordered in ED Medications  cefTRIAXone (ROCEPHIN) injection 250 mg (250 mg Intramuscular Given 06/28/18 2257)  lidocaine (PF) (XYLOCAINE) 1 % injection (0.9 mLs  Given 06/28/18 2257)  doxycycline (VIBRA-TABS) tablet 100 mg (100 mg Oral Given 06/28/18 2256)     Initial Impression / Assessment and Plan / ED Course  I have reviewed the triage vital signs and the nursing notes.  Pertinent labs & imaging results that were available during my care of the patient were reviewed by me and considered in my medical decision making (see chart for details).  Clinical Course as of Jun 29 2315  Sat Jun 28, 2018  2253 Pt with hx of QT prolongation and last ECG  QTc was 375.  Will give course of doxycycline instead of Azithromycin.   [HM]  2253 Slightly elevated.  Pt reports drinking less fluids. She is tolerating PO here.  She does have a hx of HTN.  Will need close PCP follow-up for repeat BP check and lab work  Creatinine(!): 1.18 [HM]  2254 Noted.  Pt with hx of same.   BP(!): 159/101 [HM]  2255 No evidence of UTI  Leukocytes, UA: NEGATIVE [HM]  2255 No leukocytosis  WBC: 7.6 [HM]    Clinical Course User Index [HM] Charlett Merkle, Dahlia Client, PA-C    Patient presents with suprapubic abdominal pain, dysuria, vaginal discharge and concern for STD exposure.  Labs are largely reassuring.  Elevated serum creatinine.  Suspect some element of dehydration versus worsening renal function from long-standing hypertension.  Patient is able to tolerate p.o.  We will  have her continue to drink fluids and have repeat creatinine check in 1 week.  Additionally, patient hypertensive here.  She has a history of this.  She is without chest pain or shortness of breath to suggest hypertensive urgency.  No evidence of urinary tract infection.  No cervical motion tenderness to suggest PID.  Patient given Rocephin here in the emergency department along with doxycycline.  She will be discharged home with a course of doxycycline and acyclovir.  Lesion on the right labia is not consistent with herpes.  More consistent with ingrown hair, developing abscess.  Doxycycline will help cover this.  Suggest warm compresses.  Clinically not consistent with Bartholin's gland cyst.  Discussed close follow-up with primary care physician and reasons to return immediately to the emergency department including worsening of the lesion on her labia.  She states understanding and is in agreement with the plan.  Final Clinical Impressions(s) / ED Diagnoses   Final diagnoses:  Lower abdominal pain  Vaginal discharge  Possible exposure to STD  Herpes virus infection of oral mucosa    ED Discharge Orders         Ordered    doxycycline (VIBRAMYCIN) 100 MG capsule  2 times daily     06/28/18 2315    acyclovir (ZOVIRAX) 400 MG tablet  5 times daily     06/28/18 2315           Jonnie Kubly, Dahlia Client, PA-C 06/28/18 2317    Margarita Grizzle, MD 06/30/18 [phone]

## 2018-06-28 NOTE — ED Notes (Signed)
Pt has stepped outside of the lobby to get better cell service. She will return to the waiting room after her call.

## 2018-06-28 NOTE — ED Triage Notes (Signed)
THE PT IS C/O ABD PAIN FOR ONE WEEK  THE PT THINKS THAT SHE MAY HAVE CONTRACTED A STD FROM HER SEXUAL PARTNER.  SHE HAS HAD BUMPS AROUND HER MOUTH AND HER VAGINAL AREA.  HER SEXUAL PARTNER HAS HAD A DISCHARGE AND HE HAS BUMPS AROUND HIS MOUTH ALSO AND HAS NOT BEEN CHECKED  LMP NONE HYST

## 2018-06-29 LAB — RPR: RPR: NONREACTIVE

## 2018-06-29 LAB — HIV ANTIBODY (ROUTINE TESTING W REFLEX): HIV SCREEN 4TH GENERATION: NONREACTIVE

## 2018-06-30 LAB — GC/CHLAMYDIA PROBE AMP (~~LOC~~) NOT AT ARMC
CHLAMYDIA, DNA PROBE: NEGATIVE
Neisseria Gonorrhea: NEGATIVE

## 2018-07-01 NOTE — ED Notes (Signed)
07/01/2018, Pt. Called and requested STD results.  Her results reviewed with pt.  All questions answered.

## 2019-03-31 ENCOUNTER — Other Ambulatory Visit: Payer: Self-pay

## 2019-03-31 ENCOUNTER — Encounter (HOSPITAL_COMMUNITY): Payer: Self-pay

## 2019-03-31 ENCOUNTER — Ambulatory Visit (HOSPITAL_COMMUNITY)
Admission: EM | Admit: 2019-03-31 | Discharge: 2019-03-31 | Disposition: A | Discharge status: Home / Self Care | Payer: Medicaid Other | Attending: Family Medicine | Admitting: Family Medicine

## 2019-03-31 DIAGNOSIS — Z113 Encounter for screening for infections with a predominantly sexual mode of transmission: Secondary | ICD-10-CM | POA: Insufficient documentation

## 2019-03-31 DIAGNOSIS — L0291 Cutaneous abscess, unspecified: Secondary | ICD-10-CM | POA: Insufficient documentation

## 2019-03-31 DIAGNOSIS — N898 Other specified noninflammatory disorders of vagina: Secondary | ICD-10-CM | POA: Insufficient documentation

## 2019-03-31 MED ORDER — CEFTRIAXONE SODIUM 250 MG IJ SOLR
250.0000 mg | Freq: Once | INTRAMUSCULAR | Status: AC
  Administered 2019-03-31: 250 mg via INTRAMUSCULAR

## 2019-03-31 MED ORDER — CEFTRIAXONE SODIUM 250 MG IJ SOLR
INTRAMUSCULAR | Status: AC
  Filled 2019-03-31: qty 250

## 2019-03-31 MED ORDER — AZITHROMYCIN 250 MG PO TABS
1000.0000 mg | ORAL_TABLET | Freq: Once | ORAL | Status: AC
  Administered 2019-03-31: 1000 mg via ORAL

## 2019-03-31 MED ORDER — DOXYCYCLINE HYCLATE 100 MG PO CAPS: 100.0000 mg | ORAL_CAPSULE | Freq: Two times a day (BID) | ORAL | 0 refills | Status: DC

## 2019-03-31 MED ORDER — AZITHROMYCIN 250 MG PO TABS
ORAL_TABLET | ORAL | Status: AC
  Filled 2019-03-31: qty 4

## 2019-03-31 MED ORDER — HYDROCODONE-ACETAMINOPHEN 5-325 MG PO TABS: 1.0000 | ORAL_TABLET | Freq: Four times a day (QID) | ORAL | 0 refills | Status: AC | PRN

## 2019-03-31 NOTE — Discharge Instructions (Addendum)
We will treat the abscess with antibiotics and give you a few pain pills for the next couple of days. Make sure you are doing warm compresses and you can do warm soaks with Epsom salt.  This could help promote drainage from the area. Treating for gonorrhea and chlamydia today.  Also sending a swab for testing and will call you with any positive results.  Follow up as needed for continued or worsening symptoms

## 2019-03-31 NOTE — ED Provider Notes (Signed)
[facility]    CSN: [id number] Arrival date & time: 03/31/19  1140     History   Chief Complaint Chief Complaint  Patient presents with  . Abscess  . Vomiting  . STD Testing    HPI Stacy Lucretia RoersM Deren is a 28 y.o. female.   Patient is a 28 year old female that presents today with abscess to left groin area that has been constant and  worsening over the last week.  Denies any drainage from the area.  She has been doing warm compresses to the area.  Denies any fevers.  Reporting the pain is severe and taking ibuprofen without any relief.  She has also been sexually active with new partner and would like to be tested for STDs.  She is having some vaginal discharge and irritation. had sexual intercourse with new partner.  Patient has history of hysterectomy.  Denies any abdominal pain, back pain, fevers.   ROS per HPI      Past Medical History:  Diagnosis Date  . ADD (attention deficit disorder)   . Anxiety   . Bipolar affective (HCC)   . Depression   . History of cocaine use 10/2014  . History of gestational diabetes mellitus (GDM)    with prior pregnancy  . History of marijuana use   . History of suicidal ideation   . History of VBAC   . Hx of eye surgery 02/2015  . Hx of tonsillectomy   . Hypertension    on meds 2011  . Mental disorder   . Polysubstance abuse (HCC) 10/2014   Ectasy use  . S/P cesarean section 05/25/2012  . S/p supracervical hysterectomy and bilateral salpingectomy on 09/11/15 09/11/2015  . Seizures [facility](HCC)    age [age]- after fell and hit head  . Smoker     Patient Active Problem List   Diagnosis Date Noted  . Pain in joint, pelvic region and thigh 07/18/2017  . Chronic female pelvic pain 07/18/2017  . PTSD (post-traumatic stress disorder) 07/09/2017  . Bipolar disorder with moderate depression (HCC) 07/09/2017  . Cocaine use disorder, mild, abuse (HCC) 07/09/2017  . Cannabis use disorder, mild, abuse 07/09/2017  . Prolonged Q-T interval  on ECG 07/09/2017  . Overdose of antipsychotic, intentional self-harm, sequela (HCC) 07/02/2017  . Polysubstance abuse (HCC) 07/02/2017  . Somnolence 07/02/2017  . Uterine rupture during labor 09/11/2015  . S/p supracervical hysterectomy and bilateral salpingectomy on 09/11/15 09/11/2015  . Delayed postpartum hemorrhage x 3 episodes 08/16/2015  . S/P emergency cesarean section for uterine rupture on 08/11/15 08/11/2015  . 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 History of suicidal ideation 03/15/2015  . Intermittent palpitations 03/14/2015  . Syncopal episodes 03/09/2015    Past Surgical History:  Procedure Laterality Date  . ABDOMINAL HYSTERECTOMY N/A 09/11/2015   Procedure: SUPRACERVICAL ABDONIMAL HYSTERECTOMY, ;  Surgeon: Tereso NewcomerUgonna A Anyanwu, MD;  Location: [facility];  Service: Gynecology;  Laterality: N/A;  . BILATERAL SALPINGECTOMY  09/11/2015   Procedure: BILATERAL SALPINGECTOMY;  Surgeon: Tereso NewcomerUgonna A Anyanwu, MD;  Location: [facility];  Service: Gynecology;;  . CESAREAN SECTION  05/25/2012   Procedure: CESAREAN SECTION;  Surgeon: Sherron MondayJody Bovard, MD;  Location: [facility];  Service: Gynecology;  Laterality: N/A;  Primary cesarean section with delivery of baby girl at 0700. Apgars9/9.  Marland Kitchen. CESAREAN SECTION N/A 08/11/2015   Procedure: CESAREAN SECTION;  Surgeon: Lazaro ArmsLuther H Eure, MD;  Location: [facility];  Service: Obstetrics;  Laterality: N/A;  . ORIF ORBITAL FRACTURE Left  02/25/2015   Procedure: OPEN REDUCTION INTERNAL FIXATION (ORIF) LEFT ORBITAL FRACTURE;  Surgeon: Melvenia BeamMitchell Gore, MD;  [facility];  Service: ENT;  Laterality: Left;  . TONSILLECTOMY      OB History    Gravida  5   Para  3   Term  3   Preterm  0   AB  2   Living  3     SAB  2   TAB  0   Ectopic  0   Multiple  0   Live Births  3            Home Medications    Prior to Admission medications   Medication Sig Start Date End Date Taking? Authorizing Provider  acyclovir (ZOVIRAX) 400 MG  tablet Take 2 tablets (800 mg total) by mouth 5 (five) times daily. 06/28/18   Muthersbaugh, Dahlia ClientHannah, PA-C  amLODipine (NORVASC) 10 MG tablet Take 1 tablet (10 mg total) by mouth daily. For high blood pressure 04/14/18   Hedges, Tinnie GensJeffrey, PA-C  divalproex (DEPAKOTE ER) 250 MG 24 hr tablet Take 3 tablets (750 mg total) by mouth at bedtime. For mood control 01/25/18   Bethel BornGekas, Kelly Marie, PA-C  doxycycline (VIBRAMYCIN) 100 MG capsule Take 1 capsule (100 mg total) by mouth 2 (two) times daily. 03/31/19   Dahlia ByesBast, Levada Bowersox A, NP  hydrochlorothiazide (MICROZIDE) 12.5 MG capsule Take 1 capsule (12.5 mg total) by mouth daily. For high blood pressure 01/25/18   Bethel BornGekas, Kelly Marie, PA-C  HYDROcodone-acetaminophen (NORCO/VICODIN) 5-325 MG tablet Take 1-2 tablets by mouth every 6 (six) hours as needed. 03/31/19   Dahlia ByesBast, Mahmud Keithly A, NP  ibuprofen (ADVIL,MOTRIN) 200 MG tablet Take 400 mg by mouth every 6 (six) hours as needed for moderate pain.    [provider]  ibuprofen (ADVIL,MOTRIN) 600 MG tablet Take 1 tablet (600 mg total) by mouth 3 (three) times daily. Patient not taking: Reported on 06/28/2018 04/14/18   Eyvonne MechanicHedges, Jeffrey, PA-C    Family History Family History  Problem Relation Age of Onset  . Cancer Maternal Grandmother   . Hypertension Maternal Grandmother   . Hypertension Mother   . Anesthesia problems Neg Hx     Social History Social History   Tobacco Use  . Smoking status: Current Every Day Smoker    Packs/day: 0.25    Years: 10.00    Pack years: 2.50    Types: Cigarettes  . Smokeless tobacco: Never Used  Substance Use Topics  . Alcohol use: Yes    Alcohol/week: 0.0 standard drinks    Comment: "I had one drink socially"   . Drug use: Yes    Types: Marijuana    Comment: History of cocaine and ecstasy use.      Allergies   Patient has no known allergies.   Review of Systems Review of Systems   Physical Exam Triage Vital Signs ED Triage Vitals  Enc Vitals Group     BP 03/31/19 1210  (!) 151/101     Pulse Rate 03/31/19 1210 91     Resp 03/31/19 1210 18     Temp 03/31/19 1210 98 F (36.7 C)     Temp Source 03/31/19 1210 Oral     SpO2 03/31/19 1210 99 %     Weight --      Height --      Head Circumference --      Peak Flow --      Pain Score 03/31/19 1211 9  Pain Loc --      Pain Edu? --      Excl. in [place]? --    No data found.  Updated Vital Signs BP (!) 151/101 (BP Location: Right Arm)   Pulse 91   Temp 98 F (36.7 C) (Oral)   Resp 18   LMP 10/22/2014 (Approximate)   SpO2 99%   Visual Acuity Right Eye Distance:   Left Eye Distance:   Bilateral Distance:    Right Eye Near:   Left Eye Near:    Bilateral Near:     Physical Exam Vitals signs and nursing note reviewed.  Constitutional:      General: She is not in acute distress.    Appearance: Normal appearance. She is not ill-appearing, toxic-appearing or diaphoretic.     Comments: Appears in pain   HENT:     Head: Normocephalic and atraumatic.     Nose: Nose normal.  Eyes:     Conjunctiva/sclera: Conjunctivae normal.  Neck:     Musculoskeletal: Normal range of motion.  Pulmonary:     Effort: Pulmonary effort is normal.  Abdominal:     Palpations: Abdomen is soft.     Tenderness: There is no abdominal tenderness.  Musculoskeletal: Normal range of motion.  Skin:    General: Skin is warm and dry.     Comments: Approximate 2 cm abscess to the left perineum.  Mostly fluctuant with surrounding induration  Very tender to touch.  Neurological:     Mental Status: She is alert.  Psychiatric:        Mood and Affect: Mood normal.      UC Treatments / Results  Labs (all labs ordered are listed, but only abnormal results are displayed) Labs Reviewed  CERVICOVAGINAL ANCILLARY ONLY    EKG None  Radiology No results found.  Procedures Procedures (including critical care time)  Medications Ordered in UC Medications  cefTRIAXone (ROCEPHIN) injection 250 mg (250 mg Intramuscular  Given 03/31/19 1306)  azithromycin (ZITHROMAX) tablet 1,000 mg (1,000 mg Oral Given 03/31/19 1306)    Initial Impression / Assessment and Plan / UC Course  I have reviewed the triage vital signs and the nursing notes.  Pertinent labs & imaging results that were available during my care of the patient were reviewed by me and considered in my medical decision making (see chart for details).    Abscess  Abscess to left perineum.  Appears to be ready to be drained.  Patient not wanting I&D.  Recommended continue the warm compresses and warm water soaks with Epsom salt. I believe the abscess will open up on its own is or draining or resolve with the doxycycline.  Gave a few hydrocodone to use as needed for severe pain. Recommended that if symptoms do not improve or worsen she will need to return for I&D.   Possible exposure to STD  Patient also wanting STD testing and treatment Prophylactically treated for gonorrhea and chlamydia here today and sent cervical swab for testing. Instructed that she can check her MyChart for results or we will call with any positive results. Final Clinical Impressions(s) / UC Diagnoses   Final diagnoses:  Abscess  Vaginal discharge  Screening for STD (sexually transmitted disease)     Discharge Instructions     We will treat the abscess with antibiotics and give you a few pain pills for the next couple of days. Make sure you are doing warm compresses and you can do warm soaks with Epsom salt.  This could help promote drainage from the area. Treating for gonorrhea and chlamydia today.  Also sending a swab for testing and will call you with any positive results.  Follow up as needed for continued or worsening symptoms     ED Prescriptions    Medication Sig Dispense Auth. Provider   doxycycline (VIBRAMYCIN) 100 MG capsule Take 1 capsule (100 mg total) by mouth 2 (two) times daily. 20 capsule Damiean Lukes A, NP   HYDROcodone-acetaminophen (NORCO/VICODIN)  5-325 MG tablet Take 1-2 tablets by mouth every 6 (six) hours as needed. 8 tablet Loura Halt A, NP     Controlled Substance Prescriptions [place] Controlled Substance Registry consulted? Not Applicable   Orvan July, NP 03/31/19 1542

## 2019-03-31 NOTE — ED Triage Notes (Signed)
Pt presents with abscess on left groin area X 7 days, vomiting for past 2 days, and pt would like to get STD testing.

## 2019-04-01 LAB — CERVICOVAGINAL ANCILLARY ONLY
Bacterial vaginitis: POSITIVE — AB
Candida vaginitis: NEGATIVE
Chlamydia: NEGATIVE
Neisseria Gonorrhea: NEGATIVE
Trichomonas: POSITIVE — AB

## 2019-04-02 ENCOUNTER — Telehealth (HOSPITAL_COMMUNITY): Payer: Self-pay | Admitting: Emergency Medicine

## 2019-04-02 MED ORDER — METRONIDAZOLE 500 MG PO TABS: 500.0000 mg | ORAL_TABLET | Freq: Two times a day (BID) | ORAL | 0 refills | Status: AC

## 2019-04-02 NOTE — Telephone Encounter (Signed)
Bacterial vaginosis is positive. This was not treated at the urgent care visit.  Flagyl 500 mg BID x 7 days #14 no refills sent to patients pharmacy of choice.    Trichomonas is positive. Rx  for Flagyl 2 grams, once was sent to the pharmacy of record. Pt needs education to refrain from sexual intercourse for 7 days to give the medicine time to work. Sexual partners need to be notified and tested/treated. Condoms may reduce risk of reinfection. Recheck for further evaluation if symptoms are not improving.  Patient contacted and made aware of all results, all questions answered.   

## 2019-05-05 ENCOUNTER — Ambulatory Visit (HOSPITAL_COMMUNITY)
Admission: EM | Admit: 2019-05-05 | Discharge: 2019-05-05 | Disposition: A | Discharge status: Home / Self Care | Payer: Self-pay | Attending: Family Medicine | Admitting: Family Medicine

## 2019-05-05 ENCOUNTER — Encounter (HOSPITAL_COMMUNITY): Payer: Self-pay

## 2019-05-05 ENCOUNTER — Other Ambulatory Visit: Payer: Self-pay

## 2019-05-05 DIAGNOSIS — Z113 Encounter for screening for infections with a predominantly sexual mode of transmission: Secondary | ICD-10-CM

## 2019-05-05 DIAGNOSIS — N898 Other specified noninflammatory disorders of vagina: Secondary | ICD-10-CM | POA: Insufficient documentation

## 2019-05-05 DIAGNOSIS — Z202 Contact with and (suspected) exposure to infections with a predominantly sexual mode of transmission: Secondary | ICD-10-CM | POA: Insufficient documentation

## 2019-05-05 MED ORDER — AZITHROMYCIN 250 MG PO TABS
1000.0000 mg | ORAL_TABLET | Freq: Once | ORAL | Status: AC
  Administered 2019-05-05: 1000 mg via ORAL

## 2019-05-05 MED ORDER — CEFTRIAXONE SODIUM 250 MG IJ SOLR
INTRAMUSCULAR | Status: AC
  Filled 2019-05-05: qty 250

## 2019-05-05 MED ORDER — CEFTRIAXONE SODIUM 250 MG IJ SOLR
250.0000 mg | Freq: Once | INTRAMUSCULAR | Status: AC
  Administered 2019-05-05: 250 mg via INTRAMUSCULAR

## 2019-05-05 MED ORDER — AZITHROMYCIN 250 MG PO TABS
ORAL_TABLET | ORAL | Status: AC
  Filled 2019-05-05: qty 4

## 2019-05-05 NOTE — Discharge Instructions (Addendum)
Treating you for gonorrhea and chlamydia today. Sending your swab for testing We will call you with your results Please refrain from sexual activity for at least 7 days to avoid reexposure.

## 2019-05-05 NOTE — ED Provider Notes (Signed)
[facility]    CSN: [id number] Arrival date & time: 05/05/19  1130     History   Chief Complaint Chief Complaint  Patient presents with  . Exposure to STD    HPI Stacy Moss is a 28 y.o. female.   Patient is a 28 year old female that presents today for vaginal discharge.  She is requesting STD screening and treatment.  She was seen here approximate 1 month ago and treated for STDs.  Reported she had sexual activity with known exposure partner before he was treated.  She is worried about re-exposure.  She has had her symptoms for the past couple days.  She has mild lower abdominal cramping at times.  Denies any currently.  Denies any back pain, fevers, chills, nausea or vomiting.  Denies any dysuria, hematuria or urinary frequency. Patient's last menstrual period was 10/22/2014 (approximate). History of hysterectomy  ROS per HPI      Past Medical History:  Diagnosis Date  . ADD (attention deficit disorder)   . Anxiety   . Bipolar affective (HCC)   . Depression   . History of cocaine use 10/2014  . History of gestational diabetes mellitus (GDM)    with prior pregnancy  . History of marijuana use   . History of suicidal ideation   . History of VBAC   . Hx of eye surgery 02/2015  . Hx of tonsillectomy   . Hypertension    on meds 2011  . Mental disorder   . Polysubstance abuse (HCC) 10/2014   Ectasy use  . S/P cesarean section 05/25/2012  . S/p supracervical hysterectomy and bilateral salpingectomy on 09/11/15 09/11/2015  . Seizures [facility](HCC)    age [age]- after fell and hit head  . Smoker     Patient Active Problem List   Diagnosis Date Noted  . Pain in joint, pelvic region and thigh 07/18/2017  . Chronic female pelvic pain 07/18/2017  . PTSD (post-traumatic stress disorder) 07/09/2017  . Bipolar disorder with moderate depression (HCC) 07/09/2017  . Cocaine use disorder, mild, abuse (HCC) 07/09/2017  . Cannabis use disorder, mild, abuse 07/09/2017  .  Prolonged Q-T interval on ECG 07/09/2017  . Overdose of antipsychotic, intentional self-harm, sequela (HCC) 07/02/2017  . Polysubstance abuse (HCC) 07/02/2017  . Somnolence 07/02/2017  . Uterine rupture during labor 09/11/2015  . S/p supracervical hysterectomy and bilateral salpingectomy on 09/11/15 09/11/2015  . Delayed postpartum hemorrhage x 3 episodes 08/16/2015  . S/P emergency cesarean section for uterine rupture on 08/11/15 08/11/2015  . 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 History of suicidal ideation 03/15/2015  . Intermittent palpitations 03/14/2015  . Syncopal episodes 03/09/2015    Past Surgical History:  Procedure Laterality Date  . ABDOMINAL HYSTERECTOMY N/A 09/11/2015   Procedure: SUPRACERVICAL ABDONIMAL HYSTERECTOMY, ;  Surgeon: Tereso NewcomerUgonna A Anyanwu, MD;  Location: [facility];  Service: Gynecology;  Laterality: N/A;  . BILATERAL SALPINGECTOMY  09/11/2015   Procedure: BILATERAL SALPINGECTOMY;  Surgeon: Tereso NewcomerUgonna A Anyanwu, MD;  Location: [facility];  Service: Gynecology;;  . CESAREAN SECTION  05/25/2012   Procedure: CESAREAN SECTION;  Surgeon: Sherron MondayJody Bovard, MD;  Location: [facility];  Service: Gynecology;  Laterality: N/A;  Primary cesarean section with delivery of baby girl at 0700. Apgars9/9.  Marland Kitchen. CESAREAN SECTION N/A 08/11/2015   Procedure: CESAREAN SECTION;  Surgeon: Lazaro ArmsLuther H Eure, MD;  Location: [facility];  Service: Obstetrics;  Laterality: N/A;  . ORIF ORBITAL FRACTURE Left 02/25/2015   Procedure: OPEN REDUCTION INTERNAL  FIXATION (ORIF) LEFT ORBITAL FRACTURE;  Surgeon: Ruby Cola, MD;  Location: [place];  Service: ENT;  Laterality: Left;  . TONSILLECTOMY      OB History    Gravida  5   Para  3   Term  3   Preterm  0   AB  2   Living  3     SAB  2   TAB  0   Ectopic  0   Multiple  0   Live Births  3            Home Medications    Prior to Admission medications   Medication Sig Start Date End Date Taking? Authorizing Provider   acyclovir (ZOVIRAX) 400 MG tablet Take 2 tablets (800 mg total) by mouth 5 (five) times daily. 06/28/18   Muthersbaugh, Jarrett Soho, PA-C  amLODipine (NORVASC) 10 MG tablet Take 1 tablet (10 mg total) by mouth daily. For high blood pressure 04/14/18   Hedges, Dellis Filbert, PA-C  divalproex (DEPAKOTE ER) 250 MG 24 hr tablet Take 3 tablets (750 mg total) by mouth at bedtime. For mood control 01/25/18   Recardo Evangelist, PA-C  hydrochlorothiazide (MICROZIDE) 12.5 MG capsule Take 1 capsule (12.5 mg total) by mouth daily. For high blood pressure 01/25/18   Recardo Evangelist, PA-C  HYDROcodone-acetaminophen (NORCO/VICODIN) 5-325 MG tablet Take 1-2 tablets by mouth every 6 (six) hours as needed. 03/31/19   Loura Halt A, NP  ibuprofen (ADVIL,MOTRIN) 200 MG tablet Take 400 mg by mouth every 6 (six) hours as needed for moderate pain.    [provider]  ibuprofen (ADVIL,MOTRIN) 600 MG tablet Take 1 tablet (600 mg total) by mouth 3 (three) times daily. Patient not taking: Reported on 06/28/2018 04/14/18   Okey Regal, PA-C    Family History Family History  Problem Relation Age of Onset  . Cancer Maternal Grandmother   . Hypertension Maternal Grandmother   . Hypertension Mother   . Anesthesia problems Neg Hx     Social History Social History   Tobacco Use  . Smoking status: Current Every Day Smoker    Packs/day: 0.25    Years: 10.00    Pack years: 2.50    Types: Cigarettes  . Smokeless tobacco: Never Used  Substance Use Topics  . Alcohol use: Yes    Alcohol/week: 0.0 standard drinks    Comment: "I had one drink socially"   . Drug use: Yes    Types: Marijuana    Comment: History of cocaine and ecstasy use.      Allergies   Patient has no known allergies.   Review of Systems Review of Systems   Physical Exam Triage Vital Signs ED Triage Vitals  Enc Vitals Group     BP 05/05/19 1202 (!) 149/81     Pulse Rate 05/05/19 1202 79     Resp 05/05/19 1202 18     Temp 05/05/19 1202 98.5 F  (36.9 C)     Temp Source 05/05/19 1202 Oral     SpO2 05/05/19 1202 98 %     Weight --      Height --      Head Circumference --      Peak Flow --      Pain Score 05/05/19 1203 3     Pain Loc --      Pain Edu? --      Excl. in [place]? --    No data found.  Updated Vital Signs BP Marland Kitchen)  149/81 (BP Location: Left Arm)   Pulse 79   Temp 98.5 F (36.9 C) (Oral)   Resp 18   LMP 10/22/2014 (Approximate)   SpO2 98%   Visual Acuity Right Eye Distance:   Left Eye Distance:   Bilateral Distance:    Right Eye Near:   Left Eye Near:    Bilateral Near:     Physical Exam Vitals signs and nursing note reviewed.  Constitutional:      General: She is not in acute distress.    Appearance: Normal appearance. She is not ill-appearing, toxic-appearing or diaphoretic.  HENT:     Head: Normocephalic.     Nose: Nose normal.     Mouth/Throat:     Pharynx: Oropharynx is clear.  Eyes:     Conjunctiva/sclera: Conjunctivae normal.  Neck:     Musculoskeletal: Normal range of motion.  Pulmonary:     Effort: Pulmonary effort is normal.  Abdominal:     Palpations: Abdomen is soft.     Tenderness: There is no abdominal tenderness.  Musculoskeletal: Normal range of motion.  Skin:    General: Skin is warm and dry.     Findings: No rash.  Neurological:     Mental Status: She is alert.  Psychiatric:        Mood and Affect: Mood normal.      UC Treatments / Results  Labs (all labs ordered are listed, but only abnormal results are displayed) Labs Reviewed  CERVICOVAGINAL ANCILLARY ONLY    EKG   Radiology No results found.  Procedures Procedures (including critical care time)  Medications Ordered in UC Medications  cefTRIAXone (ROCEPHIN) injection 250 mg (250 mg Intramuscular Given 05/05/19 1217)  azithromycin (ZITHROMAX) tablet 1,000 mg (1,000 mg Oral Given 05/05/19 1217)  azithromycin (ZITHROMAX) 250 MG tablet (has no administration in time range)  cefTRIAXone (ROCEPHIN) 250 MG  injection (has no administration in time range)    Initial Impression / Assessment and Plan / UC Course  I have reviewed the triage vital signs and the nursing notes.  Pertinent labs & imaging results that were available during my care of the patient were reviewed by me and considered in my medical decision making (see chart for details).     Retreating today for gonorrhea and chlamydia.  Sending self swab for retesting Recommended refrain from sexual activity for atleast 7 days or more to avoid re-exposure. Follow up as needed for continued or worsening symptoms Final Clinical Impressions(s) / UC Diagnoses   Final diagnoses:  STD exposure  Vaginal discharge     Discharge Instructions     Treating you for gonorrhea and chlamydia today. Sending your swab for testing We will call you with your results Please refrain from sexual activity for at least 7 days to avoid reexposure.     ED Prescriptions    None     Controlled Substance Prescriptions [place] Controlled Substance Registry consulted? Not Applicable   Janace ArisBast, Beryle Zeitz A, NP 05/05/19 1227

## 2019-05-05 NOTE — ED Triage Notes (Signed)
Pt presents to get STD testing and treatment; pt states she has been having vaginal discharge and pelvic pain.

## 2019-05-06 LAB — CERVICOVAGINAL ANCILLARY ONLY
Bacterial vaginitis: POSITIVE — AB
Candida vaginitis: POSITIVE — AB
Chlamydia: NEGATIVE
Neisseria Gonorrhea: NEGATIVE
Trichomonas: NEGATIVE

## 2019-05-08 ENCOUNTER — Telehealth (HOSPITAL_COMMUNITY): Payer: Self-pay | Admitting: Emergency Medicine

## 2019-05-08 MED ORDER — FLUCONAZOLE 150 MG PO TABS: 150.0000 mg | ORAL_TABLET | Freq: Once | ORAL | 0 refills | Status: AC

## 2019-05-08 MED ORDER — METRONIDAZOLE 500 MG PO TABS: 500.0000 mg | ORAL_TABLET | Freq: Two times a day (BID) | ORAL | 0 refills | Status: AC

## 2019-05-08 NOTE — Telephone Encounter (Signed)
Bacterial vaginosis is positive. This was not treated at the urgent care visit.  Flagyl 500 mg BID x 7 days #14 no refills sent to patients pharmacy of choice.    Test for candida (yeast) was positive.  Prescription for fluconazole 150mg po now, repeat dose in 3d if needed, #2 no refills, sent to the pharmacy of record.  Recheck or followup with PCP for further evaluation if symptoms are not improving.    Patient contacted and made aware of all results, all questions answered.   

## 2019-07-27 ENCOUNTER — Other Ambulatory Visit: Payer: Self-pay

## 2019-07-27 ENCOUNTER — Ambulatory Visit (HOSPITAL_COMMUNITY)
Admission: EM | Admit: 2019-07-27 | Discharge: 2019-07-27 | Disposition: A | Discharge status: Home / Self Care | Payer: Medicaid Other | Attending: Family Medicine | Admitting: Family Medicine

## 2019-07-27 ENCOUNTER — Encounter (HOSPITAL_COMMUNITY): Payer: Self-pay

## 2019-07-27 DIAGNOSIS — R1084 Generalized abdominal pain: Secondary | ICD-10-CM

## 2019-07-27 DIAGNOSIS — N898 Other specified noninflammatory disorders of vagina: Secondary | ICD-10-CM

## 2019-07-27 DIAGNOSIS — Z202 Contact with and (suspected) exposure to infections with a predominantly sexual mode of transmission: Secondary | ICD-10-CM

## 2019-07-27 LAB — POCT URINALYSIS DIP (DEVICE)
Bilirubin Urine: NEGATIVE
Glucose, UA: NEGATIVE mg/dL
Hgb urine dipstick: NEGATIVE
Ketones, ur: NEGATIVE mg/dL
Leukocytes,Ua: NEGATIVE
Nitrite: NEGATIVE
Protein, ur: NEGATIVE mg/dL
Specific Gravity, Urine: >=1.03 (ref 1.005–1.030)
Urobilinogen, UA: 0.2 mg/dL (ref 0.0–1.0)
pH: 7 (ref 5.0–8.0)

## 2019-07-27 MED ORDER — CEFTRIAXONE SODIUM 250 MG IJ SOLR
250.0000 mg | Freq: Once | INTRAMUSCULAR | Status: AC
  Administered 2019-07-27: 15:00:00 250 mg via INTRAMUSCULAR

## 2019-07-27 MED ORDER — AZITHROMYCIN 250 MG PO TABS
1000.0000 mg | ORAL_TABLET | Freq: Once | ORAL | Status: AC
  Administered 2019-07-27: 15:00:00 1000 mg via ORAL

## 2019-07-27 MED ORDER — AZITHROMYCIN 250 MG PO TABS
ORAL_TABLET | ORAL | Status: AC
  Filled 2019-07-27: qty 4

## 2019-07-27 MED ORDER — KETOROLAC TROMETHAMINE 30 MG/ML IJ SOLN
INTRAMUSCULAR | Status: AC
  Filled 2019-07-27: qty 1

## 2019-07-27 MED ORDER — METRONIDAZOLE 500 MG PO TABS: 500.0000 mg | ORAL_TABLET | Freq: Two times a day (BID) | ORAL | 0 refills | Status: DC

## 2019-07-27 MED ORDER — CEFTRIAXONE SODIUM 250 MG IJ SOLR
INTRAMUSCULAR | Status: AC
  Filled 2019-07-27: qty 250

## 2019-07-27 MED ORDER — KETOROLAC TROMETHAMINE 30 MG/ML IJ SOLN
30.0000 mg | Freq: Once | INTRAMUSCULAR | Status: AC
  Administered 2019-07-27: 30 mg via INTRAMUSCULAR

## 2019-07-27 NOTE — ED Triage Notes (Signed)
Pt present vaginal discharge and abdominal pain. Symptoms started a week ago.

## 2019-07-27 NOTE — Discharge Instructions (Signed)
Treating you for STDs today Medicine sent to the pharmacy for BV and trichomoniasis.  Toradol for pain I am concerned for PID and I would recommend if you do not get better you need a vaginal exam.

## 2019-07-28 NOTE — ED Provider Notes (Signed)
[facility]    CSN: [id number] Arrival date & time: 07/27/19  1341      History   Chief Complaint Chief Complaint  Patient presents with  . Exposure to STD    HPI Stacy Moss is a 28 y.o. female.   Concerned for STDs. Vaginal discharge and lower abd cramping. Hx of hysterectomy. New sexual partner. Hx of BV, yeast and trich. No fever.    Exposure to STD This is a new problem. The current episode started more than 1 week ago. The problem occurs constantly. The problem has not changed since onset.Associated symptoms include abdominal pain. Pertinent negatives include no chest pain, no headaches and no shortness of breath. Nothing aggravates the symptoms. Nothing relieves the symptoms. She has tried nothing for the symptoms. The treatment provided no relief.    Past Medical History:  Diagnosis Date  . ADD (attention deficit disorder)   . Anxiety   . Bipolar affective (HCC)   . Depression   . History of cocaine use 10/2014  . History of gestational diabetes mellitus (GDM)    with prior pregnancy  . History of marijuana use   . History of suicidal ideation   . History of VBAC   . Hx of eye surgery 02/2015  . Hx of tonsillectomy   . Hypertension    on meds 2011  . Mental disorder   . Polysubstance abuse (HCC) 10/2014   Ectasy use  . S/P cesarean section 05/25/2012  . S/p supracervical hysterectomy and bilateral salpingectomy on 09/11/15 09/11/2015  . Seizures [facility])    age [age]- after fell and hit head  . Smoker     Patient Active Problem List   Diagnosis Date Noted  . Pain in joint, pelvic region and thigh 07/18/2017  . Chronic female pelvic pain 07/18/2017  . PTSD (post-traumatic stress disorder) 07/09/2017  . Bipolar disorder with moderate depression (HCC) 07/09/2017  . Cocaine use disorder, mild, abuse (HCC) 07/09/2017  . Cannabis use disorder, mild, abuse 07/09/2017  . Prolonged Q-T interval on ECG 07/09/2017  . Overdose of antipsychotic,  intentional self-harm, sequela (HCC) 07/02/2017  . Polysubstance abuse (HCC) 07/02/2017  . Somnolence 07/02/2017  . Uterine rupture during labor 09/11/2015  . S/p supracervical hysterectomy and bilateral salpingectomy on 09/11/15 09/11/2015  . Delayed postpartum hemorrhage x 3 episodes 08/16/2015  . S/P emergency cesarean section for uterine rupture on 08/11/15 08/11/2015  . 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 History of suicidal ideation 03/15/2015  . Intermittent palpitations 03/14/2015  . Syncopal episodes 03/09/2015    Past Surgical History:  Procedure Laterality Date  . ABDOMINAL HYSTERECTOMY N/A 09/11/2015   Procedure: SUPRACERVICAL ABDONIMAL HYSTERECTOMY, ;  Surgeon: Tereso Newcomer, MD;  Location: [facility];  Service: Gynecology;  Laterality: N/A;  . BILATERAL SALPINGECTOMY  09/11/2015   Procedure: BILATERAL SALPINGECTOMY;  Surgeon: Tereso Newcomer, MD;  Location: [facility];  Service: Gynecology;;  . CESAREAN SECTION  05/25/2012   Procedure: CESAREAN SECTION;  Surgeon: Sherron Monday, MD;  Location: [facility];  Service: Gynecology;  Laterality: N/A;  Primary cesarean section with delivery of baby girl at 0700. Apgars9/9.  Marland Kitchen CESAREAN SECTION N/A 08/11/2015   Procedure: CESAREAN SECTION;  Surgeon: Lazaro Arms, MD;  Location: [facility];  Service: Obstetrics;  Laterality: N/A;  . ORIF ORBITAL FRACTURE Left 02/25/2015   Procedure: OPEN REDUCTION INTERNAL FIXATION (ORIF) LEFT ORBITAL FRACTURE;  Surgeon: Melvenia Beam, MD;  Location: [facility] OR;  Service: ENT;  Laterality: Left;  . TONSILLECTOMY      OB History    Gravida  5   Para  3   Term  3   Preterm  0   AB  2   Living  3     SAB  2   TAB  0   Ectopic  0   Multiple  0   Live Births  3            Home Medications    Prior to Admission medications   Medication Sig Start Date End Date Taking? Authorizing Provider  acyclovir (ZOVIRAX) 400 MG tablet Take 2 tablets (800 mg total) by mouth 5  (five) times daily. 06/28/18   Muthersbaugh, Dahlia ClientHannah, PA-C  amLODipine (NORVASC) 10 MG tablet Take 1 tablet (10 mg total) by mouth daily. For high blood pressure 04/14/18   Hedges, Tinnie GensJeffrey, PA-C  divalproex (DEPAKOTE ER) 250 MG 24 hr tablet Take 3 tablets (750 mg total) by mouth at bedtime. For mood control 01/25/18   Bethel BornGekas, Kelly Marie, PA-C  hydrochlorothiazide (MICROZIDE) 12.5 MG capsule Take 1 capsule (12.5 mg total) by mouth daily. For high blood pressure 01/25/18   Bethel BornGekas, Kelly Marie, PA-C  HYDROcodone-acetaminophen (NORCO/VICODIN) 5-325 MG tablet Take 1-2 tablets by mouth every 6 (six) hours as needed. 03/31/19   Dahlia ByesBast, Sonjia Wilcoxson A, NP  ibuprofen (ADVIL,MOTRIN) 200 MG tablet Take 400 mg by mouth every 6 (six) hours as needed for moderate pain.    [provider]  ibuprofen (ADVIL,MOTRIN) 600 MG tablet Take 1 tablet (600 mg total) by mouth 3 (three) times daily. Patient not taking: Reported on 06/28/2018 04/14/18   Hedges, Tinnie GensJeffrey, PA-C  metroNIDAZOLE (FLAGYL) 500 MG tablet Take 1 tablet (500 mg total) by mouth 2 (two) times daily. 07/27/19   Janace ArisBast, Alvie Speltz A, NP    Family History Family History  Problem Relation Age of Onset  . Cancer Maternal Grandmother   . Hypertension Maternal Grandmother   . Hypertension Mother   . Anesthesia problems Neg Hx     Social History Social History   Tobacco Use  . Smoking status: Current Every Day Smoker    Packs/day: 0.25    Years: 10.00    Pack years: 2.50    Types: Cigarettes  . Smokeless tobacco: Never Used  Substance Use Topics  . Alcohol use: Yes    Alcohol/week: 0.0 standard drinks    Comment: "I had one drink socially"   . Drug use: Yes    Types: Marijuana    Comment: History of cocaine and ecstasy use.      Allergies   Patient has no known allergies.   Review of Systems Review of Systems  Respiratory: Negative for shortness of breath.   Cardiovascular: Negative for chest pain.  Gastrointestinal: Positive for abdominal pain.   Neurological: Negative for headaches.     Physical Exam Triage Vital Signs ED Triage Vitals  Enc Vitals Group     BP 07/27/19 1356 140/88     Pulse Rate 07/27/19 1356 83     Resp 07/27/19 1356 16     Temp 07/27/19 1356 98.9 F (37.2 C)     Temp Source 07/27/19 1356 Oral     SpO2 07/27/19 1356 100 %     Weight --      Height --      Head Circumference --      Peak Flow --      Pain Score 07/27/19 1357 8  Pain Loc --      Pain Edu? --      Excl. in GC? --    No data found.  Updated Vital Signs BP 140/88 (BP Location: Left Arm)   Pulse 83   Temp 98.9 F (37.2 C) (Oral)   Resp 16   LMP 10/22/2014 (Approximate)   SpO2 100%   Visual Acuity Right Eye Distance:   Left Eye Distance:   Bilateral Distance:    Right Eye Near:   Left Eye Near:    Bilateral Near:     Physical Exam Vitals signs and nursing note reviewed.  Constitutional:      General: She is not in acute distress.    Appearance: Normal appearance. She is not ill-appearing, toxic-appearing or diaphoretic.  HENT:     Head: Normocephalic and atraumatic.     Nose: Nose normal.     Mouth/Throat:     Pharynx: Oropharynx is clear.  Eyes:     Conjunctiva/sclera: Conjunctivae normal.  Neck:     Musculoskeletal: Normal range of motion.  Pulmonary:     Effort: Pulmonary effort is normal.  Abdominal:     Palpations: Abdomen is soft.     Tenderness: There is abdominal tenderness in the right lower quadrant, suprapubic area and left lower quadrant. There is no right CVA tenderness, left CVA tenderness, guarding or rebound.  Genitourinary:    Comments: Pt refused pelvic exam.    Musculoskeletal: Normal range of motion.  Skin:    General: Skin is warm and dry.     Findings: No rash.  Neurological:     Mental Status: She is alert.  Psychiatric:        Mood and Affect: Mood normal.      UC Treatments / Results  Labs (all labs ordered are listed, but only abnormal results are displayed) Labs  Reviewed  POCT URINALYSIS DIP (DEVICE)  CERVICOVAGINAL ANCILLARY ONLY    EKG   Radiology No results found.  Procedures Procedures (including critical care time)  Medications Ordered in UC Medications  cefTRIAXone (ROCEPHIN) injection 250 mg (250 mg Intramuscular Given 07/27/19 1500)  ketorolac (TORADOL) 30 MG/ML injection 30 mg (30 mg Intramuscular Given 07/27/19 1500)  azithromycin (ZITHROMAX) tablet 1,000 mg (1,000 mg Oral Given 07/27/19 1459)  ketorolac (TORADOL) 30 MG/ML injection (has no administration in time range)  azithromycin (ZITHROMAX) 250 MG tablet (has no administration in time range)  cefTRIAXone (ROCEPHIN) 250 MG injection (has no administration in time range)    Initial Impression / Assessment and Plan / UC Course  I have reviewed the triage vital signs and the nursing notes.  Pertinent labs & imaging results that were available during my care of the patient were reviewed by me and considered in my medical decision making (see chart for details).     Pt is a 28 year old female with vaginal discharge and concerns for STDs.  She is having 8/10 abd pain but is refusing pelvic exam to check for PID Reports she is in a hurry to get back to work [employer] over PID with pt and seriousness and effects.  Pt sts that she will return if she worsens.  Will treat for STDs today and BV for precaution and send a self swab for testing.  Pt okay with this. Toradol for pain.  Return precautions given   Final Clinical Impressions(s) / UC Diagnoses   Final diagnoses:  Vaginal discharge     Discharge Instructions     Treating  you for STDs today Medicine sent to the pharmacy for BV and trichomoniasis.  Toradol for pain I am concerned for PID and I would recommend if you do not get better you need a vaginal exam.     ED Prescriptions    Medication Sig Dispense Auth. Provider   metroNIDAZOLE (FLAGYL) 500 MG tablet Take 1 tablet (500 mg total) by mouth 2 (two) times daily.  14 tablet Kamali Sakata A, NP     PDMP not reviewed this encounter.   Orvan July, NP 07/28/19 1514

## 2019-07-29 ENCOUNTER — Telehealth (HOSPITAL_COMMUNITY): Payer: Self-pay | Admitting: Family Medicine

## 2019-07-29 LAB — CERVICOVAGINAL ANCILLARY ONLY
Bacterial vaginitis: POSITIVE — AB
Candida vaginitis: POSITIVE — AB
Chlamydia: POSITIVE — AB
Neisseria Gonorrhea: POSITIVE — AB
Trichomonas: NEGATIVE

## 2019-07-29 MED ORDER — FLUCONAZOLE 150 MG PO TABS: 150.0000 mg | ORAL_TABLET | Freq: Every day | ORAL | 0 refills | Status: DC

## 2019-07-29 NOTE — Telephone Encounter (Signed)
Spoke with patient on the phone and went over positive results. Patient understanding of results. Already treated for gonorrhea and chlamydia here in clinic. Medication sent to [facility] for yeast and BV  Recommended follow-up for any continued or worsening symptoms

## 2019-12-13 ENCOUNTER — Ambulatory Visit (HOSPITAL_COMMUNITY)
Admission: EM | Admit: 2019-12-13 | Discharge: 2019-12-13 | Disposition: A | Discharge status: Home / Self Care | Payer: Medicaid Other | Attending: Family Medicine | Admitting: Family Medicine

## 2019-12-13 ENCOUNTER — Encounter (HOSPITAL_COMMUNITY): Payer: Self-pay

## 2019-12-13 ENCOUNTER — Other Ambulatory Visit: Payer: Self-pay

## 2019-12-13 DIAGNOSIS — G8929 Other chronic pain: Secondary | ICD-10-CM | POA: Insufficient documentation

## 2019-12-13 DIAGNOSIS — M545 Low back pain, unspecified: Secondary | ICD-10-CM

## 2019-12-13 DIAGNOSIS — R102 Pelvic and perineal pain: Secondary | ICD-10-CM

## 2019-12-13 DIAGNOSIS — Z113 Encounter for screening for infections with a predominantly sexual mode of transmission: Secondary | ICD-10-CM | POA: Insufficient documentation

## 2019-12-13 LAB — POCT URINALYSIS DIP (DEVICE)
Bilirubin Urine: NEGATIVE
Glucose, UA: NEGATIVE mg/dL
Hgb urine dipstick: NEGATIVE
Ketones, ur: NEGATIVE mg/dL
Leukocytes,Ua: NEGATIVE
Nitrite: NEGATIVE
Protein, ur: NEGATIVE mg/dL
Specific Gravity, Urine: >=1.03 (ref 1.005–1.030)
Urobilinogen, UA: 0.2 mg/dL (ref 0.0–1.0)
pH: 5 (ref 5.0–8.0)

## 2019-12-13 MED ORDER — KETOROLAC TROMETHAMINE 60 MG/2ML IM SOLN
60.0000 mg | Freq: Once | INTRAMUSCULAR | Status: AC
  Administered 2019-12-13: 18:00:00 60 mg via INTRAMUSCULAR

## 2019-12-13 MED ORDER — KETOROLAC TROMETHAMINE 60 MG/2ML IM SOLN
INTRAMUSCULAR | Status: AC
  Filled 2019-12-13: qty 2

## 2019-12-13 NOTE — Discharge Instructions (Signed)
Will notify you of any positive findings from your vaginal swab and if any changes to treatment are needed.   You may monitor your results on your MyChart online as well.   Continue to follow with your pain management specialist.  If symptoms worsen or do not improve in the next week to return to be seen or to follow up with your PCP.
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
## 2019-12-15 ENCOUNTER — Telehealth (HOSPITAL_COMMUNITY): Payer: Self-pay | Admitting: Emergency Medicine

## 2019-12-15 LAB — CERVICOVAGINAL ANCILLARY ONLY
Bacterial vaginitis: POSITIVE — AB
Candida vaginitis: POSITIVE — AB
Chlamydia: NEGATIVE
Neisseria Gonorrhea: NEGATIVE
Trichomonas: NEGATIVE

## 2019-12-15 MED ORDER — FLUCONAZOLE 150 MG PO TABS: 150.0000 mg | ORAL_TABLET | Freq: Once | ORAL | 0 refills | Status: AC

## 2019-12-15 MED ORDER — FLUCONAZOLE 150 MG PO TABS: 150.0000 mg | ORAL_TABLET | Freq: Once | ORAL | 0 refills | Status: DC

## 2019-12-15 MED ORDER — METRONIDAZOLE 500 MG PO TABS: 500.0000 mg | ORAL_TABLET | Freq: Two times a day (BID) | ORAL | 0 refills | Status: AC

## 2019-12-15 MED ORDER — METRONIDAZOLE 500 MG PO TABS: 500.0000 mg | ORAL_TABLET | Freq: Two times a day (BID) | ORAL | 0 refills | Status: DC

## 2019-12-15 NOTE — Telephone Encounter (Signed)
Bacterial vaginosis is positive. Pt needs treatment. Flagyl 500 mg BID x 7 days #14 no refills sent to patients pharmacy of choice.    Test for candida (yeast) was positive.  Prescription for fluconazole 150mg  po now, repeat dose in 3d if needed, #2 no refills, sent to the pharmacy of record.  Recheck or followup with PCP for further evaluation if symptoms are not improving.    Patient contacted by phone and made aware of    results. Pt verbalized understanding and had all questions answered.  Pt requesting paper prescription. Script printed and signed for pt.

## 2019-12-15 NOTE — Telephone Encounter (Signed)
Patient called requesting medications be sent to [facility] on [address], change in pharmacy made

## 2019-12-16 ENCOUNTER — Telehealth (HOSPITAL_COMMUNITY): Payer: Self-pay | Admitting: Emergency Medicine

## 2019-12-16 NOTE — Telephone Encounter (Signed)
Patient arrived in department.  Spoke to patient, provided written/printed prescriptions for medications: flagyl and fluconazole.  Patient happier to get printed copies.    Have verified that e-scripts were at pharmacy, will cancel
